# Patient Record
Sex: Female | Born: 1970
Health system: Southern US, Community
[De-identification: ages and names within clinical notes are randomized; demographics above are authoritative.]

## PROBLEM LIST (undated history)

## (undated) DIAGNOSIS — R51 Headache: Secondary | ICD-10-CM

## (undated) DIAGNOSIS — R030 Elevated blood-pressure reading, without diagnosis of hypertension: Secondary | ICD-10-CM

## (undated) DIAGNOSIS — Z800 Family history of malignant neoplasm of digestive organs: Secondary | ICD-10-CM

## (undated) DIAGNOSIS — R7611 Nonspecific reaction to tuberculin skin test without active tuberculosis: Secondary | ICD-10-CM

## (undated) DIAGNOSIS — N200 Calculus of kidney: Secondary | ICD-10-CM

## (undated) DIAGNOSIS — I10 Essential (primary) hypertension: Secondary | ICD-10-CM

## (undated) DIAGNOSIS — Z8639 Personal history of other endocrine, nutritional and metabolic disease: Secondary | ICD-10-CM

## (undated) DIAGNOSIS — M1990 Unspecified osteoarthritis, unspecified site: Secondary | ICD-10-CM

## (undated) DIAGNOSIS — Z87442 Personal history of urinary calculi: Secondary | ICD-10-CM

## (undated) DIAGNOSIS — J45909 Unspecified asthma, uncomplicated: Secondary | ICD-10-CM

## (undated) DIAGNOSIS — C801 Malignant (primary) neoplasm, unspecified: Secondary | ICD-10-CM

## (undated) DIAGNOSIS — Z803 Family history of malignant neoplasm of breast: Secondary | ICD-10-CM

## (undated) DIAGNOSIS — R519 Headache, unspecified: Secondary | ICD-10-CM

## (undated) DIAGNOSIS — F419 Anxiety disorder, unspecified: Secondary | ICD-10-CM

## (undated) HISTORY — DX: Family history of malignant neoplasm of digestive organs: Z80.0

## (undated) HISTORY — DX: Unspecified asthma, uncomplicated: J45.909

## (undated) HISTORY — DX: Elevated blood-pressure reading, without diagnosis of hypertension: R03.0

## (undated) HISTORY — DX: Nonspecific reaction to tuberculin skin test without active tuberculosis: R76.11

## (undated) HISTORY — PX: TONSILLECTOMY: SUR1361

## (undated) HISTORY — DX: Personal history of other endocrine, nutritional and metabolic disease: Z86.39

## (undated) HISTORY — DX: Malignant (primary) neoplasm, unspecified: C80.1

## (undated) HISTORY — DX: Headache, unspecified: R51.9

## (undated) HISTORY — DX: Family history of malignant neoplasm of breast: Z80.3

## (undated) HISTORY — DX: Headache: R51

## (undated) HISTORY — PX: LITHOTRIPSY: SUR834

---

## 2005-04-05 HISTORY — PX: NASAL SEPTUM SURGERY: SHX37

## 2016-02-09 ENCOUNTER — Telehealth: Payer: Self-pay | Admitting: *Deleted

## 2016-02-09 NOTE — Telephone Encounter (Signed)
Pt says that she has a mole forming on her back and would like to know if provider could see her for an acute visit sooner than her np appt? If so is it okay to put 2 slots together for scheduling?    Please advise.   CB: [phone]   Thanks.

## 2016-02-09 NOTE — Telephone Encounter (Signed)
Sure that is fine.

## 2016-02-10 NOTE — Telephone Encounter (Signed)
Called pt to schedule acute visit, no answer. Left vm for pt to call back to schedule.

## 2016-02-11 NOTE — Telephone Encounter (Signed)
Pt has been scheduled.  °

## 2016-02-19 ENCOUNTER — Other Ambulatory Visit (HOSPITAL_COMMUNITY)
Admission: RE | Admit: 2016-02-19 | Discharge: 2016-02-19 | Disposition: A | Discharge status: Home / Self Care | Payer: TRICARE For Life (TFL) | Source: Ambulatory Visit | Attending: Family Medicine | Admitting: Family Medicine

## 2016-02-19 ENCOUNTER — Encounter: Payer: Self-pay | Admitting: Family Medicine

## 2016-02-19 ENCOUNTER — Ambulatory Visit (INDEPENDENT_AMBULATORY_CARE_PROVIDER_SITE_OTHER): Payer: TRICARE For Life (TFL) | Admitting: Family Medicine

## 2016-02-19 VITALS — BP 147/94 | HR 69 | Temp 98.3°F | Ht 66.0 in | Wt 162.4 lb

## 2016-02-19 DIAGNOSIS — L820 Inflamed seborrheic keratosis: Secondary | ICD-10-CM | POA: Diagnosis not present

## 2016-02-19 DIAGNOSIS — L989 Disorder of the skin and subcutaneous tissue, unspecified: Secondary | ICD-10-CM

## 2016-02-19 DIAGNOSIS — R030 Elevated blood-pressure reading, without diagnosis of hypertension: Secondary | ICD-10-CM

## 2016-02-19 NOTE — Progress Notes (Signed)
Pre visit review using our clinic review tool, if applicable. No additional management support is needed unless otherwise documented below in the visit note. 

## 2016-02-19 NOTE — Progress Notes (Signed)
[place] at [facility] [address], [place], [place] [address] [id number] [phone]  Date:  02/19/2016   Name:  Lauren Mcclure   DOB:  18-Mar-1971   MRN:  [id number]  PCP:  No primary care provider on file.    Chief Complaint: Nevus (c/o mole on back x 1 yr. pt states that at first the mole was flat but is now raised. the area is itchy and uncomfortable when touched. )   History of Present Illness:  Lauren Mcclure is a 45 y.o. very pleasant female patient who presents with the following:  She is here today as a new patient with concern about a skin finding on her back.  She moved to this area from [place] last year and is establishing care with [place] She is generally in good health- no current mediation She has had some issues with her BP in the past- she did take medication for a few months a couple of years ago but no longer takes any antihypertensives  HTN does run in her family.   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  She would like to have this removed today if possible She is allergic to rocepin Her menses are irregular and have been so for years,  She does not suspect pregnancy  There are no active problems to display for this patient.   Past Medical History:  Diagnosis Date  . Elevated blood pressure reading   . Headache     Past Surgical History:  Procedure Laterality Date  . NASAL SEPTUM SURGERY  2007  . TONSILLECTOMY      Social History  Substance Use Topics  . Smoking status: Never Smoker  . Smokeless tobacco: Never Used  . Alcohol use Yes     Comment: Occ wine or beer    Family History  Problem Relation Age of Onset  .  Hypertension Mother   . Pancreatic cancer Father     deceased age 27  . Cancer - Other Paternal Grandmother   . Skin cancer Paternal Grandfather   . Heart disease Paternal Grandfather     No Known Allergies  Medication list has been reviewed and updated.  No current outpatient prescriptions on file prior to visit.   No current facility-administered medications on file prior to visit.     Review of Systems:  As per HPI- otherwise negative.  No fever, chills, nausea, vomiting, rash   Physical Examination: Vitals:   02/19/16 1123  BP: (!) 145/83  Pulse: 69  Temp: 98.3 F (36.8 C)   Vitals:   02/19/16 1123  Weight: 162 lb 6.4 oz (73.7 kg)  Height: 5\' 6"  (1.676 m)   Body mass index is 26.21 kg/m. Ideal Body Weight: Weight in (lb) to have BMI = 25: 154.6  GEN: WDWN, NAD, Non-toxic, A & O x 3, looks well HEENT: Atraumatic, Normocephalic. Neck supple. No masses, No LAD. Ears and Nose: No external deformity. CV: RRR, No M/G/R. No JVD. No thrill. No extra heart sounds. PULM: CTA B, no wheezes, crackles, rhonchi. No retractions. No resp. distress. No accessory muscle use. EXTR:  No c/c/e NEURO Normal gait.  PSYCH: Normally interactive. Conversant. Not depressed or anxious appearing.  Calm demeanor.  Skin lesion on left flank- it measures 10 x10 mm and has the appearance of a pedunculated seborrheic keratosis  VC obtained.  Prepped with betadine and alcohol.  Anesthesia with 12ml of 2% lidocaine. Used 11 blade to shave lesion. Had anticipated needing to suture but the lesion was quite superficial so this was not needed.  Used pressure and silver nitrate sticks for hemostasis. Applied bandage. Pt tolerated the procedure well and there were no complications    Assessment and Plan: Skin lesion - Plan: Dermatology pathology  Borderline blood pressure  Here today as a new patient.  Removed skin lesion as above and sent to path.  Await report Discussed wound care with her Her  BP is borderline and has been treated in the past. She will see me next month to follow-up on her BP and do labs.    Signed Lamar Blinks, MD

## 2016-02-19 NOTE — Patient Instructions (Signed)
We will send you skin lesion in for analysis but I do think it is benign- not cancerous  Leave the dressing in place until tomorrow, and then you can shower, pat dry and apply a new dressing.  You can apply a small amount of vaseline to the wound if need be.  Keep it covered with a dressing or band- aid until a solid scab forms. If you have more than very slight bleeding or any other concerns please seek care  Please come and see me in a few months to check on your blood pressure and do labs

## 2016-02-24 ENCOUNTER — Encounter: Payer: Self-pay | Admitting: Family Medicine

## 2016-03-24 ENCOUNTER — Telehealth: Payer: Self-pay

## 2016-03-24 DIAGNOSIS — R7611 Nonspecific reaction to tuberculin skin test without active tuberculosis: Secondary | ICD-10-CM

## 2016-03-24 HISTORY — DX: Nonspecific reaction to tuberculin skin test without active tuberculosis: R76.11

## 2016-03-24 NOTE — Telephone Encounter (Signed)
Pre visit call made to patient

## 2016-03-25 ENCOUNTER — Encounter: Payer: Self-pay | Admitting: Family Medicine

## 2016-03-25 ENCOUNTER — Ambulatory Visit (HOSPITAL_BASED_OUTPATIENT_CLINIC_OR_DEPARTMENT_OTHER)
Admission: RE | Admit: 2016-03-25 | Discharge: 2016-03-25 | Disposition: A | Discharge status: Home / Self Care | Payer: TRICARE For Life (TFL) | Source: Ambulatory Visit | Attending: Family Medicine | Admitting: Family Medicine

## 2016-03-25 ENCOUNTER — Ambulatory Visit (INDEPENDENT_AMBULATORY_CARE_PROVIDER_SITE_OTHER): Payer: TRICARE For Life (TFL) | Admitting: Family Medicine

## 2016-03-25 VITALS — BP 156/85 | HR 65 | Temp 97.9°F | Ht 66.5 in | Wt 164.2 lb

## 2016-03-25 DIAGNOSIS — Z130 Encounter for screening for diseases of the blood and blood-forming organs and certain disorders involving the immune mechanism: Secondary | ICD-10-CM | POA: Diagnosis not present

## 2016-03-25 DIAGNOSIS — M7732 Calcaneal spur, left foot: Secondary | ICD-10-CM | POA: Insufficient documentation

## 2016-03-25 DIAGNOSIS — M79672 Pain in left foot: Secondary | ICD-10-CM

## 2016-03-25 DIAGNOSIS — Z23 Encounter for immunization: Secondary | ICD-10-CM

## 2016-03-25 DIAGNOSIS — N926 Irregular menstruation, unspecified: Secondary | ICD-10-CM

## 2016-03-25 DIAGNOSIS — M25561 Pain in right knee: Secondary | ICD-10-CM

## 2016-03-25 DIAGNOSIS — Z13220 Encounter for screening for lipoid disorders: Secondary | ICD-10-CM

## 2016-03-25 DIAGNOSIS — G8929 Other chronic pain: Secondary | ICD-10-CM

## 2016-03-25 DIAGNOSIS — M25562 Pain in left knee: Secondary | ICD-10-CM

## 2016-03-25 DIAGNOSIS — Z131 Encounter for screening for diabetes mellitus: Secondary | ICD-10-CM

## 2016-03-25 DIAGNOSIS — M1711 Unilateral primary osteoarthritis, right knee: Secondary | ICD-10-CM | POA: Insufficient documentation

## 2016-03-25 DIAGNOSIS — R030 Elevated blood-pressure reading, without diagnosis of hypertension: Secondary | ICD-10-CM

## 2016-03-25 DIAGNOSIS — R937 Abnormal findings on diagnostic imaging of other parts of musculoskeletal system: Secondary | ICD-10-CM | POA: Diagnosis not present

## 2016-03-25 DIAGNOSIS — Z1329 Encounter for screening for other suspected endocrine disorder: Secondary | ICD-10-CM

## 2016-03-25 DIAGNOSIS — G43109 Migraine with aura, not intractable, without status migrainosus: Secondary | ICD-10-CM

## 2016-03-25 LAB — CBC
HCT: 40.6 % (ref 36.0–46.0)
Hemoglobin: 13.5 g/dL (ref 12.0–15.0)
MCHC: 33.2 g/dL (ref 30.0–36.0)
MCV: 73.5 fl — AB (ref 78.0–100.0)
Platelets: 294 10*3/uL (ref 150.0–400.0)
RBC: 5.53 Mil/uL — ABNORMAL HIGH (ref 3.87–5.11)
RDW: 17.9 % — AB (ref 11.5–15.5)
WBC: 9.4 10*3/uL (ref 4.0–10.5)

## 2016-03-25 LAB — POCT URINE PREGNANCY: PREG TEST UR: NEGATIVE

## 2016-03-25 LAB — COMPREHENSIVE METABOLIC PANEL
ALT: 20 U/L (ref 0–35)
AST: 19 U/L (ref 0–37)
Albumin: 4.3 g/dL (ref 3.5–5.2)
Alkaline Phosphatase: 71 U/L (ref 39–117)
BILIRUBIN TOTAL: 1.6 mg/dL — AB (ref 0.2–1.2)
BUN: 11 mg/dL (ref 6–23)
CHLORIDE: 104 meq/L (ref 96–112)
CO2: 31 meq/L (ref 19–32)
CREATININE: 0.84 mg/dL (ref 0.40–1.20)
Calcium: 9.3 mg/dL (ref 8.4–10.5)
GFR: 77.28 mL/min (ref >60.00)
GLUCOSE: 106 mg/dL — AB (ref 70–99)
Potassium: 3.5 mEq/L (ref 3.5–5.1)
Sodium: 140 mEq/L (ref 135–145)
Total Protein: 7.4 g/dL (ref 6.0–8.3)

## 2016-03-25 LAB — LIPID PANEL
CHOL/HDL RATIO: 4
Cholesterol: 193 mg/dL (ref 0–200)
HDL: 44.5 mg/dL (ref >39.00)
LDL CALC: 111 mg/dL — AB (ref 0–99)
NONHDL: 148.69
TRIGLYCERIDES: 187 mg/dL — AB (ref 0.0–149.0)
VLDL: 37.4 mg/dL (ref 0.0–40.0)

## 2016-03-25 LAB — HEMOGLOBIN A1C: Hgb A1c MFr Bld: 5.7 % (ref 4.6–6.5)

## 2016-03-25 LAB — TSH: TSH: 0.94 u[IU]/mL (ref 0.35–4.50)

## 2016-03-25 MED ORDER — AMLODIPINE BESYLATE 2.5 MG PO TABS: 2.5000 mg | ORAL_TABLET | Freq: Every day | ORAL | 3 refills | Status: DC

## 2016-03-25 MED ORDER — SUMATRIPTAN SUCCINATE 100 MG PO TABS: ORAL_TABLET | ORAL | 0 refills | Status: DC

## 2016-03-25 MED ORDER — CELECOXIB 200 MG PO CAPS: 200.0000 mg | ORAL_CAPSULE | Freq: Every day | ORAL | 3 refills | Status: DC

## 2016-03-25 NOTE — Progress Notes (Signed)
[place] at [facility] [address], [place], [place] [address] [id number] [phone]  Date:  03/25/2016   Name:  Lauren Mcclure   DOB:  1970/09/23   MRN:  [id number]  PCP:  Lamar Blinks, MD    Chief Complaint: Establish Care (Pt here to est care. Would like flu vaccine today. )   History of Present Illness:  Lauren Mcclure is a 45 y.o. very pleasant female patient who presents with the following:  Seen here about a month ago- I removed a benign skin lesion from her back at that time- seborrheic keratosis   She was on BP med in the past- has not taken in a couple of years. She is not quite sure what she took however. She was also on cholesterol med.  Her mother has a history of HTN She does play a lot of tennis for exercise- she plays year round. She plays 2-3x a week right now.  Will play more in the summer months She would like to lose some weight. She exercises but thinks she should improve her diet She is not fasting- she had coffee, bread and butter.    She does not have children. Never been pregnant   She tends to have left heel pain- it has been present for about 6 months.  Not worse in the morning.  Worst after activity such as playing tennis She also noted bilateral knee pain that is on and off.  Worse after she plays tennis or is on her feet a lot She has used some of her husband's celebrex that really helps her- she would like to have an rx for same  BP Readings from Last 3 Encounters:  03/25/16 (!) 152/78  02/19/16 (!) 147/94   She has noted headaches around the time of her menses. Her LMP was about 3 weeks ago.  She has tried midol, motrin, celebrex, excedrin.  The HA will generally be in one side of her head.  She does not have nausea or vomiting but does have phono and photosensitivity.  She will sometimes have an aura.  She will have to rest in a dark room when she has the HA.  Seems consistent with  migraine  Patient Active Problem List   Diagnosis Date Noted  . Elevated blood pressure reading   . Borderline blood pressure 02/19/2016    Past Medical History:  Diagnosis Date  . Asthma    as a teenager  . Elevated blood pressure reading   . Headache   . History of hyperlipidemia   . 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 NASAL SEPTUM SURGERY  2007  . TONSILLECTOMY      Social History  Substance Use Topics  . Smoking status: Never Smoker  . Smokeless tobacco: Never Used  . Alcohol use Yes     Comment: Occ wine or beer    Family History  Problem Relation Age of Onset  . Hypertension Mother   . Hyperlipidemia Mother   . Pancreatic cancer Father     deceased age 19  . Cancer - Other Paternal Grandmother   . Skin cancer Paternal Grandfather   . Heart disease Paternal Grandfather   . Hyperlipidemia Maternal Aunt     Allergies  Allergen Reactions  . Rocephin [Ceftriaxone Sodium In Dextrose] Rash    Medication list has  been reviewed and updated.  Current Outpatient Prescriptions on File Prior to Visit  Medication Sig Dispense Refill  . Ascorbic Acid (VITAMIN C PO) Take 1 tablet by mouth daily.    . Cholecalciferol (VITAMIN D PO) Take 1 tablet by mouth daily.    Marland Kitchen CRANBERRY PO Take 1 tablet by mouth daily.    . TURMERIC PO Take 1 tablet by mouth daily.     No current facility-administered medications on file prior to visit.     Review of Systems:  As per HPI- otherwise negative.   Physical Examination: Vitals:   03/25/16 0939  BP: (!) 152/78  Pulse: 65  Temp: 97.9 F (36.6 C)   Vitals:   03/25/16 0939  Weight: 164 lb 3.2 oz (74.5 kg)  Height: 5' 6.5" (1.689 m)   Body mass index is 26.11 kg/m. Ideal Body Weight: Weight in (lb) to have BMI = 25: 156.9  GEN: WDWN, NAD, Non-toxic, A & O x 3, slight overweight, looks well HEENT:  Atraumatic, Normocephalic. Neck supple. No masses, No LAD.  Bilateral TM wnl, oropharynx normal.  PEERL,EOMI.   Ears and Nose: No external deformity. CV: RRR, No M/G/R. No JVD. No thrill. No extra heart sounds. PULM: CTA B, no wheezes, crackles, rhonchi. No retractions. No resp. distress. No accessory muscle use. ABD: S, NT, ND, +BS. No rebound. No HSM. EXTR: No c/c/e NEURO Normal gait.  PSYCH: Normally interactive. Conversant. Not depressed or anxious appearing.  Calm demeanor.  Both knees with normal ROM, cannot reproduce pain on exam.  Ligaments are stable, no effusion, heat or redness Left cancaneous is slightly tender to squeeze, ankle tendons are non tender.  Achilles is intact  Dg Os Calcis Left  Result Date: 03/25/2016 CLINICAL DATA:  Chronic pain EXAM: LEFT OS CALCIS - 2+ VIEW COMPARISON:  None. FINDINGS: Lateral and Harris views were obtained. No fracture or dislocation. The joint spaces appear normal. No erosion. There are spurs arising from the posterior calcaneus and to a lesser extent inferiorly from the calcaneus. IMPRESSION: Calcaneal spurs. No fracture or dislocation. No apparent arthropathy. Electronically Signed   By: Lowella Grip III M.D.   On: 03/25/2016 12:06   Dg Knee Complete 4 Views Left  Result Date: 03/25/2016 CLINICAL DATA:  Pain EXAM: LEFT KNEE - COMPLETE 4+ VIEW COMPARISON:  None. FINDINGS: Frontal, lateral, and bilateral oblique views were obtained. There is no fracture or dislocation. No joint effusion. Joint spaces appear unremarkable. No erosive change. Soft tissue prominence is noted posterior to the knee joint. IMPRESSION: Soft tissue prominence noted posterior to the joint. Question Baker cyst. From an imaging standpoint, ultrasound or MR would be the imaging studies of choice for further assessment for potential Baker cyst. No fracture or joint effusion. No appreciable joint space narrowing or erosion. Electronically Signed   By: Lowella Grip III  M.D.   On: 03/25/2016 12:04   Dg Knee Complete 4 Views Right  Result Date: 03/25/2016 CLINICAL DATA:  Pain EXAM: RIGHT KNEE - COMPLETE 4+ VIEW COMPARISON:  None. FINDINGS: Frontal, lateral, and bilateral oblique views were obtained. There is no evident fracture or dislocation. No joint effusion. Joint spaces appear normal. There is slight spurring along the posterior patella and medial compartments. No erosive change. IMPRESSION: Slight osteoarthritic change.  No fracture or joint effusion. Electronically Signed   By: Lowella Grip III M.D.   On: 03/25/2016 12:04   Results for orders placed or performed in visit on 03/25/16  CBC  Result  Value Ref Range   WBC 9.4 4.0 - 10.5 K/uL   RBC 5.53 (H) 3.87 - 5.11 Mil/uL   Platelets 294.0 150.0 - 400.0 K/uL   Hemoglobin 13.5 12.0 - 15.0 g/dL   HCT 40.6 36.0 - 46.0 %   MCV 73.5 (L) 78.0 - 100.0 fl   MCHC 33.2 30.0 - 36.0 g/dL   RDW 17.9 (H) 11.5 - 15.5 %  Comprehensive metabolic panel  Result Value Ref Range   Sodium 140 135 - 145 mEq/L   Potassium 3.5 3.5 - 5.1 mEq/L   Chloride 104 96 - 112 mEq/L   CO2 31 19 - 32 mEq/L   Glucose, Bld 106 (H) 70 - 99 mg/dL   BUN 11 6 - 23 mg/dL   Creatinine, Ser 0.84 0.40 - 1.20 mg/dL   Total Bilirubin 1.6 (H) 0.2 - 1.2 mg/dL   Alkaline Phosphatase 71 39 - 117 U/L   AST 19 0 - 37 U/L   ALT 20 0 - 35 U/L   Total Protein 7.4 6.0 - 8.3 g/dL   Albumin 4.3 3.5 - 5.2 g/dL   Calcium 9.3 8.4 - 10.5 mg/dL   GFR 77.28 >60.00 mL/min  Lipid panel  Result Value Ref Range   Cholesterol 193 0 - 200 mg/dL   Triglycerides 187.0 (H) 0.0 - 149.0 mg/dL   HDL 44.50 >39.00 mg/dL   VLDL 37.4 0.0 - 40.0 mg/dL   LDL Cholesterol 111 (H) 0 - 99 mg/dL   Total CHOL/HDL Ratio 4    NonHDL 148.69   TSH  Result Value Ref Range   TSH 0.94 0.35 - 4.50 uIU/mL  Hemoglobin A1c  Result Value Ref Range   Hgb A1c MFr Bld 5.7 4.6 - 6.5 %  POCT urine pregnancy  Result Value Ref Range   Preg Test, Ur Negative Negative     Assessment and Plan: Elevated blood pressure reading - Plan: amLODipine (NORVASC) 2.5 MG tablet  Need for immunization against influenza - Plan: Flu Vaccine QUAD 36+ mos PF IM (Fluarix & Fluzone Quad PF)  Screening for hyperlipidemia - Plan: Lipid panel  Screening for diabetes mellitus - Plan: Comprehensive metabolic panel, Hemoglobin A1c  Screening for thyroid disorder - Plan: TSH  Screening for deficiency anemia - Plan: CBC  Chronic pain of both knees - Plan: celecoxib (CELEBREX) 200 MG capsule, DG Knee Complete 4 Views Left, DG Knee Complete 4 Views Right  Migraine with aura and without status migrainosus, not intractable - Plan: SUMAtriptan (IMITREX) 100 MG tablet  Chronic heel pain, left - Plan: DG Os Calcis Left  Irregular menses - Plan: POCT urine pregnancy  Here today to discuss a few concerns.   Labs as above, sent a lab letter celebrex to use prn for knee pain It sounds as though she has migraine headache- will have her try a triptan as above Flu shot Start on a low dose of norvasc for her BP Obtain knee and heel films and discuss options with her  Signed Lamar Blinks, MD

## 2016-03-25 NOTE — Patient Instructions (Addendum)
It was very nice to see you today- we will get x-rays of your knees and your left heel today. I will be in touch with these results asap We will also check your labs for you today You got a flu shot today We are going to start amlodipine 2.5 mg for your blood pressure- take it just once a day Take the celebrex daily if needed for knee pain I gave you an rx for imitrex- try this for your more severe (migraine) headaches. Take it at the first sign of the headache  Please come and see me in about 2 months to check your blood pressure

## 2016-03-25 NOTE — Progress Notes (Signed)
Pre visit review using our clinic review tool, if applicable. No additional management support is needed unless otherwise documented below in the visit note. 

## 2016-03-26 ENCOUNTER — Other Ambulatory Visit: Payer: TRICARE For Life (TFL)

## 2016-03-26 DIAGNOSIS — M79672 Pain in left foot: Secondary | ICD-10-CM

## 2016-03-26 DIAGNOSIS — G8929 Other chronic pain: Secondary | ICD-10-CM

## 2016-03-26 DIAGNOSIS — M25561 Pain in right knee: Secondary | ICD-10-CM

## 2016-03-26 DIAGNOSIS — M25562 Pain in left knee: Secondary | ICD-10-CM

## 2016-03-27 LAB — BILIRUBIN, FRACTIONATED(TOT/DIR/INDIR)
Bilirubin, Direct: 0.1 mg/dL (ref <=0.2)
Indirect Bilirubin: 1.4 mg/dL — ABNORMAL HIGH (ref 0.2–1.2)
Total Bilirubin: 1.5 mg/dL — ABNORMAL HIGH (ref 0.2–1.2)

## 2016-03-27 NOTE — Progress Notes (Signed)
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   Called pt to discuss- will refer her to sports med to discuss further.  She may ned further eval of her heel to rule-out stress fracture Labs are ok- have sent her a letter with labs. 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   Results for orders placed or performed in visit on 03/26/16  Bilirubin, fractionated(tot/dir/indir)  Result Value Ref Range   Total Bilirubin 1.5 (H) 0.2 - 1.2 mg/dL   Bilirubin, Direct 0.1 <=0.2 mg/dL   Indirect Bilirubin 1.4 (H) 0.2 - 1.2 mg/dL     Chemistry      Component Value Date/Time   NA 140 03/25/2016 1031   K 3.5 03/25/2016 1031   CL 104 03/25/2016 1031   CO2 31 03/25/2016 1031   BUN 11 03/25/2016 1031   CREATININE 0.84 03/25/2016 1031      Component Value Date/Time   CALCIUM 9.3 03/25/2016 1031   ALKPHOS 71 03/25/2016 1031   AST 19 03/25/2016 1031   ALT 20 03/25/2016 1031   BILITOT 1.5 (H) 03/26/2016 1511     Dg Os Calcis Left  Result Date: 03/25/2016 CLINICAL DATA:  Chronic pain EXAM: LEFT OS CALCIS - 2+ VIEW COMPARISON:  None. FINDINGS: Lateral and Harris views were obtained. No fracture or dislocation. The joint spaces appear normal. No erosion. There are spurs arising from the posterior calcaneus and to a lesser extent inferiorly from the calcaneus. IMPRESSION: Calcaneal spurs. No fracture or dislocation. No apparent arthropathy. Electronically Signed   By: Lowella Grip III M.D.   On: 03/25/2016 12:06   Dg Knee Complete 4 Views Left  Result Date: 03/25/2016 CLINICAL DATA:  Pain EXAM: LEFT KNEE - COMPLETE 4+ VIEW COMPARISON:  None. FINDINGS: Frontal, lateral, and bilateral oblique views were obtained. There is no fracture or dislocation. No joint effusion. Joint spaces appear unremarkable. No erosive change. Soft tissue prominence is noted posterior to the knee joint. IMPRESSION: Soft tissue prominence noted posterior to the joint. Question Baker cyst.  From an imaging standpoint, ultrasound or MR would be the imaging studies of choice for further assessment for potential Baker cyst. No fracture or joint effusion. No appreciable joint space narrowing or erosion. Electronically Signed   By: Lowella Grip III M.D.   On: 03/25/2016 12:04   Dg Knee Complete 4 Views Right  Result Date: 03/25/2016 CLINICAL DATA:  Pain EXAM: RIGHT KNEE - COMPLETE 4+ VIEW COMPARISON:  None. FINDINGS: Frontal, lateral, and bilateral oblique views were obtained. There is no evident fracture or dislocation. No joint effusion. Joint spaces appear normal. There is slight spurring along the posterior patella and medial compartments. No erosive change. IMPRESSION: Slight osteoarthritic change.  No fracture or joint effusion. Electronically Signed   By: Lowella Grip III M.D.   On: 03/25/2016 12:04

## 2016-06-02 ENCOUNTER — Ambulatory Visit: Admitting: Family Medicine

## 2016-06-02 ENCOUNTER — Telehealth: Payer: Self-pay | Admitting: Family Medicine

## 2016-06-02 NOTE — Telephone Encounter (Signed)
Patient cancelled 2:30pm appointment due to work conflict, patient [facility] to 07/01/16 at 1pm, charge or no charge

## 2016-06-02 NOTE — Telephone Encounter (Signed)
No charge. 

## 2016-07-01 ENCOUNTER — Ambulatory Visit: Admitting: Family Medicine

## 2016-11-04 ENCOUNTER — Encounter (HOSPITAL_BASED_OUTPATIENT_CLINIC_OR_DEPARTMENT_OTHER): Payer: Self-pay | Admitting: Emergency Medicine

## 2016-11-04 ENCOUNTER — Emergency Department (HOSPITAL_BASED_OUTPATIENT_CLINIC_OR_DEPARTMENT_OTHER)
Admission: EM | Admit: 2016-11-04 | Discharge: 2016-11-04 | Disposition: A | Discharge status: Home / Self Care | Payer: TRICARE For Life (TFL) | Attending: Emergency Medicine | Admitting: Emergency Medicine

## 2016-11-04 ENCOUNTER — Telehealth: Payer: Self-pay | Admitting: Family Medicine

## 2016-11-04 ENCOUNTER — Emergency Department (HOSPITAL_BASED_OUTPATIENT_CLINIC_OR_DEPARTMENT_OTHER): Payer: TRICARE For Life (TFL)

## 2016-11-04 DIAGNOSIS — Z79899 Other long term (current) drug therapy: Secondary | ICD-10-CM | POA: Diagnosis not present

## 2016-11-04 DIAGNOSIS — N200 Calculus of kidney: Secondary | ICD-10-CM | POA: Diagnosis not present

## 2016-11-04 DIAGNOSIS — R1084 Generalized abdominal pain: Secondary | ICD-10-CM | POA: Diagnosis present

## 2016-11-04 DIAGNOSIS — R110 Nausea: Secondary | ICD-10-CM | POA: Diagnosis not present

## 2016-11-04 DIAGNOSIS — J45909 Unspecified asthma, uncomplicated: Secondary | ICD-10-CM | POA: Insufficient documentation

## 2016-11-04 LAB — COMPREHENSIVE METABOLIC PANEL
ALBUMIN: 3.8 g/dL (ref 3.5–5.0)
ALT: 45 U/L (ref 14–54)
AST: 35 U/L (ref 15–41)
Alkaline Phosphatase: 65 U/L (ref 38–126)
Anion gap: 8 (ref 5–15)
BILIRUBIN TOTAL: 2 mg/dL — AB (ref 0.3–1.2)
BUN: 14 mg/dL (ref 6–20)
CALCIUM: 8.6 mg/dL — AB (ref 8.9–10.3)
CO2: 25 mmol/L (ref 22–32)
CREATININE: 1.31 mg/dL — AB (ref 0.44–1.00)
Chloride: 104 mmol/L (ref 101–111)
GFR calc Af Amer: 55 mL/min — ABNORMAL LOW (ref >60)
GFR, EST NON AFRICAN AMERICAN: 48 mL/min — AB (ref >60)
GLUCOSE: 91 mg/dL (ref 65–99)
Potassium: 3.2 mmol/L — ABNORMAL LOW (ref 3.5–5.1)
Sodium: 137 mmol/L (ref 135–145)
TOTAL PROTEIN: 7.2 g/dL (ref 6.5–8.1)

## 2016-11-04 LAB — CBC WITH DIFFERENTIAL/PLATELET
BASOS ABS: 0 10*3/uL (ref 0.0–0.1)
BASOS PCT: 0 %
EOS ABS: 0.1 10*3/uL (ref 0.0–0.7)
EOS PCT: 1 %
HCT: 41.5 % (ref 36.0–46.0)
Hemoglobin: 14.4 g/dL (ref 12.0–15.0)
LYMPHS PCT: 19 %
Lymphs Abs: 2.3 10*3/uL (ref 0.7–4.0)
MCH: 26.9 pg (ref 26.0–34.0)
MCHC: 34.7 g/dL (ref 30.0–36.0)
MCV: 77.4 fL — AB (ref 78.0–100.0)
MONO ABS: 1.4 10*3/uL — AB (ref 0.1–1.0)
Monocytes Relative: 11 %
Neutro Abs: 8.6 10*3/uL — ABNORMAL HIGH (ref 1.7–7.7)
Neutrophils Relative %: 69 %
PLATELETS: 278 10*3/uL (ref 150–400)
RBC: 5.36 MIL/uL — AB (ref 3.87–5.11)
RDW: 15 % (ref 11.5–15.5)
WBC: 12.5 10*3/uL — AB (ref 4.0–10.5)

## 2016-11-04 MED ORDER — SODIUM CHLORIDE 0.9 % IV BOLUS (SEPSIS)
1000.0000 mL | Freq: Once | INTRAVENOUS | Status: AC
Start: 2016-11-04 — End: 2016-11-04
  Administered 2016-11-04: 1000 mL via INTRAVENOUS

## 2016-11-04 MED ORDER — KETOROLAC TROMETHAMINE 10 MG PO TABS: 10.0000 mg | ORAL_TABLET | Freq: Four times a day (QID) | ORAL | 0 refills | Status: DC | PRN

## 2016-11-04 MED ORDER — KETOROLAC TROMETHAMINE 15 MG/ML IJ SOLN
15.0000 mg | Freq: Once | INTRAMUSCULAR | Status: AC
  Administered 2016-11-04: 15 mg via INTRAVENOUS
  Filled 2016-11-04: qty 1

## 2016-11-04 MED ORDER — SODIUM CHLORIDE 0.9 % IV SOLN: 1000.0000 mL | INTRAVENOUS | Status: DC

## 2016-11-04 MED ORDER — KETOROLAC TROMETHAMINE 30 MG/ML IJ SOLN
30.0000 mg | Freq: Once | INTRAMUSCULAR | Status: AC
  Administered 2016-11-04: 30 mg via INTRAVENOUS
  Filled 2016-11-04: qty 1

## 2016-11-04 MED FILL — KETOROLAC 10 MG TABLET: 10 | 5 days supply | Qty: 20 | Fill #0

## 2016-11-04 NOTE — Telephone Encounter (Signed)
Caller name: [employer]  Relation to pt: self  Call back number: [phone] Pharmacy:  Reason for call: Pt came in office stating was seen at the ER and doctor that saw her today stated pt is needing an urgent referral to Urology, pt asked for an Urology that is located near by, pt preferred [facility] [address], [place]. Please if possible to put in referral ASAP to the location mentioned above. Please advise.

## 2016-11-04 NOTE — Discharge Instructions (Signed)
Take Toradol every 4-6 hours as needed for pain. Do not take more than 4 in a 24-hour period. Do not take other anti-inflammatories at the same time (naproxen, Aleve, ibuprofen, Motrin, Advil) Continue taking tamsulosin as prescribed. You may use Zofran as needed for nausea. Stay well-hydrated with water. Follow-up with urology. Return to the emergency department if you develop persistent fevers, worsening pain, persistent vomiting, inability to urinate, or any new or worsening symptoms.

## 2016-11-04 NOTE — ED Provider Notes (Signed)
[place] [facility] Provider Note   CSN: [id number] Arrival date & time: 11/04/16  1043     History   Chief Complaint Chief Complaint  Patient presents with  . Flank Pain  . Abdominal Pain    HPI Lauren Mcclure is a 46 y.o. female presenting with right side flank pain 1 day.  Patient states that she had acute onset right side flank pain yesterday morning. Pain is intermittent and sharp. Worsens with movement. No association with oral intake or urination. Patient reports associated nausea. She was evaluated at urgent care yesterday, where they did a UA and found blood in the urine without signs of UTI. Additionally, they did an x-ray which they found that she had an increased stool burden. She was told to follow-up for further evaluation for possible kidney stone. She denies fever, chills, chest pain, shortness of breath, cough, current nausea, vomiting, dysuria, increased frequency of urination, gross blood in the urine, abnormal bowel movements, or anterior abdominal pain. She was given ultram to help with the pain from urgent care, and states that she took 2 with only minimal relief. She states she has high blood pressure and takes medication for this, but has not taken it today.   HPI  Past Medical History:  Diagnosis Date  . Asthma    as a teenager  . Elevated blood pressure reading   . Headache   . History of hyperlipidemia   . 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 Elevated blood pressure reading   . Borderline blood pressure 02/19/2016    Past Surgical History:  Procedure Laterality Date  . NASAL SEPTUM SURGERY  2007  . TONSILLECTOMY      OB History    No data available       Home Medications    Prior to Admission medications   Medication Sig Start Date End Date Taking? Authorizing Provider  amLODipine (NORVASC) 2.5 MG tablet Take 1 tablet  (2.5 mg total) by mouth daily. 03/25/16   Copland, Gay Filler, MD  Ascorbic Acid (VITAMIN C PO) Take 1 tablet by mouth daily.    [provider]  celecoxib (CELEBREX) 200 MG capsule Take 1 capsule (200 mg total) by mouth daily. Use as needed for knee pain 03/25/16   Copland, Gay Filler, MD  Cholecalciferol (VITAMIN D PO) Take 1 tablet by mouth daily.    [provider]  CRANBERRY PO Take 1 tablet by mouth daily.    [provider]  ketorolac (TORADOL) 10 MG tablet Take 1 tablet (10 mg total) by mouth every 6 (six) hours as needed. 11/04/16   Roe Wilner, PA-C  TURMERIC PO Take 1 tablet by mouth daily.    [provider]    Family History Family History  Problem Relation Age of Onset  . Hypertension Mother   . Hyperlipidemia Mother   . Pancreatic cancer Father        deceased age 14  . Cancer - Other Paternal Grandmother   . Skin cancer Paternal Grandfather   . Heart disease Paternal Grandfather   . Hyperlipidemia Maternal Aunt     Social History Social History  Substance Use Topics  . Smoking status: Never Smoker  . Smokeless tobacco: Never Used  . Alcohol use Yes     Comment: Occ wine or beer     Allergies   Rocephin [  ceftriaxone sodium in dextrose]   Review of Systems Review of Systems  Constitutional: Negative for chills and fever.  HENT: Negative for congestion and sore throat.   Eyes: Negative for photophobia and visual disturbance.  Respiratory: Negative for cough, chest tightness and shortness of breath.   Cardiovascular: Negative for chest pain and palpitations.  Gastrointestinal: Positive for nausea. Negative for abdominal distention, blood in stool, constipation, diarrhea and vomiting.  Genitourinary: Positive for flank pain. Negative for dysuria, frequency and hematuria.  Musculoskeletal: Negative for neck pain and neck stiffness.  Skin: Negative for wound.  Neurological: Negative for dizziness, light-headedness and  headaches.  Hematological: Does not bruise/bleed easily.  Psychiatric/Behavioral: Negative for agitation and confusion.     Physical Exam Updated Vital Signs BP (!) 161/99 (BP Location: Right Arm)   Pulse (!) 56   Temp 98.2 F (36.8 C) (Oral)   Resp 18   Ht 5\' 6"  (1.676 m)   Wt 73.5 kg (162 lb)   SpO2 100%   BMI 26.15 kg/m   Physical Exam  Constitutional: She is oriented to person, place, and time. She appears well-developed and well-nourished. No distress.  HENT:  Head: Normocephalic and atraumatic.  Eyes: EOM are normal.  Neck: Normal range of motion. Neck supple.  Cardiovascular: Normal rate, regular rhythm, normal heart sounds and intact distal pulses.   Pulmonary/Chest: Effort normal and breath sounds normal. No respiratory distress. She has no wheezes.  Abdominal: Soft. Bowel sounds are normal. She exhibits no distension. There is CVA tenderness.  Tenderness to palpation of the right flank, with associated soreness of the right side. No tenderness to palpation elsewhere in the abdomen.  Musculoskeletal: Normal range of motion.  Neurological: She is alert and oriented to person, place, and time.  Skin: Skin is warm and dry.  Psychiatric: She has a normal mood and affect.  Nursing note and vitals reviewed.    ED Treatments / Results  Labs (all labs ordered are listed, but only abnormal results are displayed) Labs Reviewed  COMPREHENSIVE METABOLIC PANEL - Abnormal; Notable for the following:       Result Value   Potassium 3.2 (*)    Creatinine, Ser 1.31 (*)    Calcium 8.6 (*)    Total Bilirubin 2.0 (*)    GFR calc non Af Amer 48 (*)    GFR calc Af Amer 55 (*)    All other components within normal limits  CBC WITH DIFFERENTIAL/PLATELET - Abnormal; Notable for the following:    WBC 12.5 (*)    RBC 5.36 (*)    MCV 77.4 (*)    Neutro Abs 8.6 (*)    Monocytes Absolute 1.4 (*)    All other components within normal limits    EKG  EKG Interpretation None         Radiology Ct Renal Stone Study  Result Date: 11/04/2016 CLINICAL DATA:  Right flank pain radiating to right abdomen, nausea and urinary frequency. Possible kidney stone. Additional history of hyperlipidemia and asthma. EXAM: CT ABDOMEN AND PELVIS WITHOUT CONTRAST TECHNIQUE: Multidetector CT imaging of the abdomen and pelvis was performed following the standard protocol without IV contrast. COMPARISON:  None. FINDINGS: Lower chest: No acute abnormality. Hepatobiliary: Liver is low in density suggesting fatty infiltration. Irregular low-density focus within the inferior left liver lobe, centered in segment 4B, measures 2.5 x 2.1 cm, possibly additional focal fatty infiltration. Gallbladder is unremarkable.  No bile duct dilatation. Pancreas: Unremarkable. No pancreatic ductal dilatation or surrounding inflammatory changes.  Spleen: Normal in size without focal abnormality. Adrenals/Urinary Tract: 4 mm stone within the proximal right ureter causing moderate right-sided hydronephrosis with associated perinephric edema. Left kidney appears normal without mass, stone or hydronephrosis. Bladder is unremarkable, partially decompressed. Stomach/Bowel: Bowel is normal in caliber. No bowel wall thickening or evidence of bowel wall inflammation. Appendix is not convincingly seen but there are no inflammatory changes about the cecum to suggest acute appendicitis. Vascular/Lymphatic: No significant vascular findings are present. No enlarged abdominal or pelvic lymph nodes. Reproductive: Probable small fibroids exophytic to the uterine body. Cystic mass in the right adnexa measures 3.2 cm, presumed ovarian cyst versus normal dominant follicle. Left adnexal region is unremarkable. Other: No free intraperitoneal air. Musculoskeletal: No acute or suspicious osseous finding. Superficial soft tissues are unremarkable. IMPRESSION: 1. 4 mm obstructing stone within the proximal right ureter causing moderate right-sided  hydronephrosis with associated perinephric edema. 2. Fatty infiltration of the liver. 3. **An incidental finding of potential clinical significance has been found. Ill-defined low-density focus within the inferior left liver lobe, segment 4B, measuring approximately 2.5 cm greatest dimension, suspected additional focal fatty infiltration given its location adjacent to the gallbladder fossa. Neoplastic lesion or hemangioma is considered less likely but cannot be completely excluded based on this single noncontrast examination. Given the background of hepatic steatosis, recommend further characterization with a nonemergent liver MRI. ** 4. Probable small uterine fibroids. Electronically Signed   By: Franki Cabot M.D.   On: 11/04/2016 12:40    Procedures Procedures (including critical care time)  Medications Ordered in ED Medications  sodium chloride 0.9 % bolus 1,000 mL (0 mLs Intravenous Stopped 11/04/16 1350)  ketorolac (TORADOL) 30 MG/ML injection 30 mg (30 mg Intravenous Given 11/04/16 1253)  ketorolac (TORADOL) 15 MG/ML injection 15 mg (15 mg Intravenous Given 11/04/16 1421)     Initial Impression / Assessment and Plan / ED Course  I have reviewed the triage vital signs and the nursing notes.  Pertinent labs & imaging results that were available during my care of the patient were reviewed by me and considered in my medical decision making (see chart for details).     Pt presenting with acute onset R flank pain concerning for kidney stone. Labs and CT renal ordered. Will not reorder UA, as yesterdays shows hgb an no signs of UTI (per chart review). IVF, ketorolac given for pain.   Labs showed slight increase in SCr (1.31, increased from baseline 0.8). And increased WBC at 12.5. CT renal showed 4 mm stone at proximal R ureter with moderate hydronephrosis and perinephric edema. On reassessment, pt states her pain is at 0. Discussed with attending, and Dr. Canary Brim agrees to plan. Consulted urology,  and spoke with Dr. Tresa Moore. Pt to take oral toradol and tamulosin, and f/u in office tomorrow or Monday.   Discussed findings and plan with pt. Pt states she has tamulosin and zofran rx from UC. Discussed taking toradol instead of ultram. Pt states her pain is returning slightly, will give 15 of ketorolac, for a total of 45 today. Pt appears safe for discharge. Return precautions given. Pt states she understands and agrees to plan.   Final Clinical Impressions(s) / ED Diagnoses   Final diagnoses:  Kidney stone    New Prescriptions Discharge Medication List as of 11/04/2016  2:10 PM    START taking these medications   Details  ketorolac (TORADOL) 10 MG tablet Take 1 tablet (10 mg total) by mouth every 6 (six) hours as needed., Starting  Thu 11/04/2016, Print         [place], Graeson Nouri, PA-C 11/04/16 1813    Pixie Casino, MD 11/05/16 [phone]

## 2016-11-04 NOTE — Telephone Encounter (Signed)
Please give her a call, let her know that referral is placed.  If she needs help in the meantime please let me know .  Ok to leave this info on her machine

## 2016-11-04 NOTE — ED Triage Notes (Signed)
Pt having right flank pain radiating to right abdomen.  Pt has some nausea and urinary frequency.  No fever.  Pt seen yesterday at [facility] urgent care and was referred for a CT scan for possible kidney stone.

## 2016-11-05 NOTE — Telephone Encounter (Signed)
Called pt to inform referral has been placed. No answer, left voicemail.

## 2016-11-08 ENCOUNTER — Ambulatory Visit: Admitting: Family Medicine

## 2016-11-09 ENCOUNTER — Emergency Department (HOSPITAL_BASED_OUTPATIENT_CLINIC_OR_DEPARTMENT_OTHER)

## 2016-11-09 ENCOUNTER — Emergency Department (HOSPITAL_BASED_OUTPATIENT_CLINIC_OR_DEPARTMENT_OTHER)
Admission: EM | Admit: 2016-11-09 | Discharge: 2016-11-09 | Disposition: A | Discharge status: Home / Self Care | Attending: Emergency Medicine | Admitting: Emergency Medicine

## 2016-11-09 ENCOUNTER — Encounter (HOSPITAL_BASED_OUTPATIENT_CLINIC_OR_DEPARTMENT_OTHER): Payer: Self-pay | Admitting: *Deleted

## 2016-11-09 DIAGNOSIS — J45909 Unspecified asthma, uncomplicated: Secondary | ICD-10-CM | POA: Diagnosis not present

## 2016-11-09 DIAGNOSIS — G43009 Migraine without aura, not intractable, without status migrainosus: Secondary | ICD-10-CM

## 2016-11-09 DIAGNOSIS — Z79899 Other long term (current) drug therapy: Secondary | ICD-10-CM | POA: Diagnosis not present

## 2016-11-09 DIAGNOSIS — I10 Essential (primary) hypertension: Secondary | ICD-10-CM | POA: Insufficient documentation

## 2016-11-09 DIAGNOSIS — R51 Headache: Secondary | ICD-10-CM | POA: Diagnosis present

## 2016-11-09 DIAGNOSIS — E860 Dehydration: Secondary | ICD-10-CM

## 2016-11-09 HISTORY — DX: Calculus of kidney: N20.0

## 2016-11-09 LAB — CBC WITH DIFFERENTIAL/PLATELET
BASOS PCT: 0 %
Basophils Absolute: 0 10*3/uL (ref 0.0–0.1)
EOS ABS: 0 10*3/uL (ref 0.0–0.7)
EOS PCT: 0 %
HCT: 43.1 % (ref 36.0–46.0)
Hemoglobin: 14.9 g/dL (ref 12.0–15.0)
LYMPHS ABS: 0.8 10*3/uL (ref 0.7–4.0)
Lymphocytes Relative: 7 %
MCH: 26.7 pg (ref 26.0–34.0)
MCHC: 34.6 g/dL (ref 30.0–36.0)
MCV: 77.2 fL — ABNORMAL LOW (ref 78.0–100.0)
Monocytes Absolute: 0.4 10*3/uL (ref 0.1–1.0)
Monocytes Relative: 4 %
Neutro Abs: 10.3 10*3/uL — ABNORMAL HIGH (ref 1.7–7.7)
Neutrophils Relative %: 89 %
PLATELETS: 363 10*3/uL (ref 150–400)
RBC: 5.58 MIL/uL — AB (ref 3.87–5.11)
RDW: 15.2 % (ref 11.5–15.5)
WBC: 11.5 10*3/uL — AB (ref 4.0–10.5)

## 2016-11-09 LAB — URINALYSIS, ROUTINE W REFLEX MICROSCOPIC
Bilirubin Urine: NEGATIVE
Glucose, UA: NEGATIVE mg/dL
Ketones, ur: >80 mg/dL — AB
LEUKOCYTES UA: NEGATIVE
NITRITE: NEGATIVE
PH: 7.5 (ref 5.0–8.0)
Protein, ur: NEGATIVE mg/dL
Specific Gravity, Urine: 1.016 (ref 1.005–1.030)

## 2016-11-09 LAB — COMPREHENSIVE METABOLIC PANEL
ALBUMIN: 4.2 g/dL (ref 3.5–5.0)
ALT: 49 U/L (ref 14–54)
ANION GAP: 12 (ref 5–15)
AST: 42 U/L — ABNORMAL HIGH (ref 15–41)
Alkaline Phosphatase: 81 U/L (ref 38–126)
BUN: 15 mg/dL (ref 6–20)
CO2: 24 mmol/L (ref 22–32)
CREATININE: 1.15 mg/dL — AB (ref 0.44–1.00)
Calcium: 8.9 mg/dL (ref 8.9–10.3)
Chloride: 99 mmol/L — ABNORMAL LOW (ref 101–111)
GFR calc non Af Amer: 56 mL/min — ABNORMAL LOW (ref >60)
Glucose, Bld: 116 mg/dL — ABNORMAL HIGH (ref 65–99)
Potassium: 3.7 mmol/L (ref 3.5–5.1)
SODIUM: 135 mmol/L (ref 135–145)
Total Bilirubin: 1.8 mg/dL — ABNORMAL HIGH (ref 0.3–1.2)
Total Protein: 8.5 g/dL — ABNORMAL HIGH (ref 6.5–8.1)

## 2016-11-09 LAB — PREGNANCY, URINE: Preg Test, Ur: NEGATIVE

## 2016-11-09 LAB — URINALYSIS, MICROSCOPIC (REFLEX)

## 2016-11-09 LAB — I-STAT CG4 LACTIC ACID, ED: LACTIC ACID, VENOUS: 1.39 mmol/L (ref 0.5–1.9)

## 2016-11-09 LAB — TROPONIN I

## 2016-11-09 MED ORDER — ONDANSETRON 4 MG PO TBDP: 4.0000 mg | ORAL_TABLET | Freq: Three times a day (TID) | ORAL | 0 refills | Status: DC | PRN

## 2016-11-09 MED ORDER — PROCHLORPERAZINE EDISYLATE 5 MG/ML IJ SOLN
10.0000 mg | Freq: Once | INTRAMUSCULAR | Status: AC
  Administered 2016-11-09: 10 mg via INTRAVENOUS
  Filled 2016-11-09: qty 2

## 2016-11-09 MED ORDER — SODIUM CHLORIDE 0.9 % IV BOLUS (SEPSIS)
1000.0000 mL | Freq: Once | INTRAVENOUS | Status: AC
  Administered 2016-11-09: 1000 mL via INTRAVENOUS

## 2016-11-09 MED ORDER — DIPHENHYDRAMINE HCL 50 MG/ML IJ SOLN
25.0000 mg | Freq: Once | INTRAMUSCULAR | Status: AC
  Administered 2016-11-09: 25 mg via INTRAVENOUS
  Filled 2016-11-09: qty 1

## 2016-11-09 NOTE — ED Notes (Signed)
ED Provider at bedside. 

## 2016-11-09 NOTE — ED Notes (Signed)
Patient transported to CT 

## 2016-11-09 NOTE — ED Notes (Signed)
Patient is resting comfortably. 

## 2016-11-09 NOTE — ED Notes (Signed)
Patient transported to X-ray 

## 2016-11-09 NOTE — ED Notes (Signed)
Given apple juice, tol well

## 2016-11-09 NOTE — ED Provider Notes (Signed)
By signing my name below, I, Dora Sims, attest that this documentation has been prepared under the direction and in the presence of physician practitioner, Leah Skora, Delice Bison, DO. Electronically Signed: Dora Sims, Scribe. 11/09/2016. 12:43 AM.  TIME SEEN: 12:32 AM  CHIEF COMPLAINT: Hypertension; Headache  HPI: HPI Comments: Lauren Mcclure is a 45 y.o. female with a history of migraines and kidney stones who presents to the Emergency Department complaining of a persistent, severe headache since yesterday afternoon. She underwent lithotripsy for a right-sided stone at 2:30 PM on 8/6 with Dr. Estill Dooms at [facility]. She states it went well without complication but she is still having some intermittent persistent right-sided flank pain for which she is taking hydrocodone. Approximately one hour after the procedure she developed a significant headache that has persisted. The headache feels like her chronic migraines. She describes it as diffuse, throbbing without radiation. She has had nausea and vomiting and photophobia. She has had some occasional chills and mild dyspnea in association with her headache. Patient states that her blood pressure has been elevated since the procedure. Her BP was measured at 185/111 in the ED tonight and is typically measured around 130/90. Patient has not missed any doses of her amlodipine within the last week and last took it on the morning of 8/6. Her pre-existing right flank pain has persisted since the operation; she was sent home with hydrocodone and last took it two hours ago with some relief. She has also continued to have hematuria. No recent head injuries or tick bites. Not on antiplatelets or anticoagulants. No known ill contacts. She took a trip to the [place] one month ago but otherwise denies any recent foreign travel.  Patient denies gait disturbance, visual changes, numbness/tingling, focal weakness, fevers, cough, chest pain, or any other associated symptoms.    ROS: See HPI Constitutional: no fever  Eyes: no drainage  ENT: no runny nose   Cardiovascular:  no chest pain  Resp: SOB  GI: no vomiting GU: no dysuria Integumentary: no rash  Allergy: no hives  Musculoskeletal: no leg swelling  Neurological: no slurred speech ROS otherwise negative  PAST MEDICAL HISTORY/PAST SURGICAL HISTORY:  Past Medical History:  Diagnosis Date  . Asthma    as a teenager  . Elevated blood pressure reading   . Headache   . History of hyperlipidemia   . Kidney stones   . 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 Authorizing Provider  HYDROcodone-acetaminophen (NORCO/VICODIN) 5-325 MG tablet Take 1 tablet by mouth every 6 (six) hours as needed for moderate pain.   Yes [provider]  amLODipine (NORVASC) 2.5 MG tablet Take 1 tablet (2.5 mg total) by mouth daily. 03/25/16   Copland, Gay Filler, MD  Ascorbic Acid (VITAMIN C PO) Take 1 tablet by mouth daily.    [provider]  celecoxib (CELEBREX) 200 MG capsule Take 1 capsule (200 mg total) by mouth daily. Use as needed for knee pain 03/25/16   Copland, Gay Filler, MD  Cholecalciferol (VITAMIN D PO) Take 1 tablet by mouth daily.    [provider]  CRANBERRY PO Take 1 tablet by mouth daily.    [provider]  ketorolac (TORADOL) 10 MG tablet Take 1 tablet (10 mg total) by mouth every 6 (six) hours as needed. 11/04/16   Caccavale, Sophia, PA-C  TURMERIC PO Take  1 tablet by mouth daily.    [provider]    ALLERGIES:  Allergies  Allergen Reactions  . Rocephin [Ceftriaxone Sodium In Dextrose] Rash    SOCIAL HISTORY:  Social History  Substance Use Topics  . Smoking status: Never Smoker  . Smokeless tobacco: Never Used  . Alcohol use Yes     Comment: Occ wine or beer    FAMILY HISTORY: Family History  Problem  Relation Age of Onset  . Hypertension Mother   . Hyperlipidemia Mother   . Pancreatic cancer Father        deceased age 50  . Cancer - Other Paternal Grandmother   . Skin cancer Paternal Grandfather   . Heart disease Paternal Grandfather   . Hyperlipidemia Maternal Aunt     EXAM: BP (!) 185/111   Pulse 95   Temp 99.9 F (37.7 C) (Oral)   Resp 16   Ht 5\' 6"  (1.676 m)   Wt 163 lb (73.9 kg)   LMP 10/05/2016   SpO2 97%   BMI 26.31 kg/m  CONSTITUTIONAL: Alert and oriented and responds appropriately to questions. Well-appearing; well-nourished, afebrile and nontoxic HEAD: Normocephalic, atraumatic EYES: Conjunctivae clear, pupils appear equal, EOMI, photophobia ENT: normal nose; moist mucous membranes NECK: Supple, no meningismus, no nuchal rigidity, no LAD, no midline spinal tenderness or step-off or deformity  CARD: RRR; S1 and S2 appreciated; no murmurs, no clicks, no rubs, no gallops RESP: Normal chest excursion without splinting or tachypnea; breath sounds clear and equal bilaterally; no wheezes, no rhonchi, no rales, no hypoxia or respiratory distress, speaking full sentences ABD/GI: Normal bowel sounds; non-distended; soft, non-tender, no rebound, no guarding, no peritoneal signs, no hepatosplenomegaly BACK:  The back appears normal and patient does have some mild right flank pain on exam EXT: Normal ROM in all joints; non-tender to palpation; no edema; normal capillary refill; no cyanosis, no calf tenderness or swelling    SKIN: Normal color for age and race; warm; no rash NEURO: Moves all extremities equally; Strength 5/5 in all four extremities.  Normal sensation diffusely.  CN 2-12 grossly intact.  Normal speech.  Normal gait. PSYCH: The patient's mood and manner are appropriate. Grooming and personal hygiene are appropriate.  MEDICAL DECISION MAKING: Patient here with complaints of headache. She thinks that this may be similar to her prior migraines but she is concerned  that her blood pressure is elevated. He does have a low-grade oral temperature here of 99.9. No sign of meningismus on exam. She is neurologically intact but unfortunately is a very poor historian. We'll obtain a rectal temperature and a septic workup including lactate. We'll hold off on antibiotics until we know if she is truly febrile or not. Will obtain a head CT but my suspicion for intracranial hemorrhage, meningitis, stroke is very low. She is neurologically intact here. We'll obtain labs, urine. Will treat her headache with IV fluids, Compazine and Benadryl. Will monitor her blood pressure closely. EKG shows no ischemic abnormality.  ED PROGRESS: Patient's rectal temperature is normal at 99.1. She has had hydrocodone at home that has Tylenol in it prior to arrival but denies any fevers at home. Her labs do show mild leukocytosis with left shift which may be reactive. She reported feeling mildly short of breath we obtain a chest x-ray which showed no infiltrate. She's not had any cough. Her lactate is normal. Urine shows blood which is to be expected after her lithotripsy but also large ketones. Will give her second  liter of IV fluids. Her pregnancy test is negative. Head CT is also completely normal. I went to reevaluate the patient and her headache is almost completely gone after migraine cocktail. She still has normal range of motion in her neck and no signs of meningismus on exam. Again very low suspicion for intracranial hemorrhage and meningitis today and I do not feel she needs a lumbar puncture emergently. I feel that this is headache is related to her migraines which is likely triggered by the fact that she is dehydrated today. Has been reports that this is an issue for her and states that she never drinks enough water. Her blood pressures come down as her symptoms have improved and I feel that her hypertension was likely secondary to her pain rather than her blood pressure causing her headache  today. I have recommend close follow-up with her primary care physician however if her blood pressure continues to be elevated but have recommended she continue her amlodipine as prescribed. I feel she is safe to be discharged home with her husband. We'll discharge with prescription of Zofran to take as needed. She has hydrocodone at home. She will follow-up with her urologist as scheduled. No sign of urinary tract infection and I do not feel she needs to be started on antibiotics.   At this time, I do not feel there is any life-threatening condition present. I have reviewed and discussed all results (EKG, imaging, lab, urine as appropriate) and exam findings with patient/family. I have reviewed nursing notes and appropriate previous records.  I feel the patient is safe to be discharged home without further emergent workup and can continue workup as an outpatient as needed. Discussed usual and customary return precautions. Patient/family verbalize understanding and are comfortable with this plan.  Outpatient follow-up has been provided if needed. All questions have been answered.    EKG Interpretation  Date/Time:  Tuesday November 09 2016 00:26:40 EDT Ventricular Rate:  92 PR Interval:    QRS Duration: 101 QT Interval:  391 QTC Calculation: 484 R Axis:   85 Text Interpretation:  Sinus rhythm Minimal ST elevation, anterior leads Baseline wander in lead(s) II aVR No old tracing to compare Confirmed by Hjalmar Ballengee, Cyril Mourning [id number]) on 11/09/2016 12:31:15 AM        I personally performed the services described in this documentation, which was scribed in my presence. The recorded information has been reviewed and is accurate.    Jovanny Stephanie, Delice Bison, DO 11/09/16 [phone]

## 2016-11-09 NOTE — ED Triage Notes (Signed)
Pt c/o increased bp after lithotripsy today, also c/o h/a n/v

## 2016-11-09 NOTE — Discharge Instructions (Addendum)
You may continue your hydrocodone at home as prescribed for pain.   Your labs and urine today reassuring but did show dehydration. You need to increase your water intake and try to drink at least 8 eight ounce glasses of water a day.   I suspect you were having a migraine headache and that this is what caused your blood pressure to be elevated. I recommend you follow-up closely with your primary care physician if your blood pressure continues to be elevated.

## 2016-11-13 NOTE — Progress Notes (Addendum)
[facility] [place], [place], [place] [address] [id number] [phone]  Date:  11/15/2016   Name:  Lauren Mcclure   DOB:  1970-04-29   MRN:  [id number]  PCP:  Darreld Mclean, MD    Chief Complaint: Follow-up (ER follow up,pain in right side today)   History of Present Illness:  Lauren Mcclure is a 46 y.o. very pleasant female patient who presents with the following:  Here today for a follow-up visit I last saw her about 8 months ago to follow-up on her HTN She was then in the ER on 8/7with the following:  Lauren Mcclure is a 46 y.o. female with a history of migraines and kidney stones who presents to the Emergency Department complaining of a persistent, severe headache since yesterday afternoon. She underwent lithotripsy for a right-sided stone at 2:30 PM on 8/6 with Dr. Estill Dooms at [facility]. She states it went well without complication but she is still having some intermittent persistent right-sided flank pain for which she is taking hydrocodone. Approximately one hour after the procedure she developed a significant headache that has persisted. The headache feels like her chronic migraines. She describes it as diffuse, throbbing without radiation. She has had nausea and vomiting and photophobia. She has had some occasional chills and mild dyspnea in association with her headache. Patient states that her blood pressure has been elevated since the procedure. Her BP was measured at 185/111 in the ED tonight and is typically measured around 130/90. Patient has not missed any doses of her amlodipine within the last week and last took it on the morning of 8/6. Her pre-existing right flank pain has persisted since the operation; she was sent home with hydrocodone and last took it two hours ago with some relief. She has also continued to have hematuria. No recent head injuries or tick bites. Not on antiplatelets or anticoagulants. No known ill  contacts. She took a trip to the [place] one month ago but otherwise denies any recent foreign travel.  Patient denies gait disturbance, visual changes, numbness/tingling, focal weakness, fevers, cough, chest pain, or any other associated symptoms.   She was treated with a migraine cocktail and her sx got much better, her BP came back down with pain relief and she was released to home BP Readings from Last 3 Encounters:  11/15/16 (!) 157/114  11/09/16 136/84  11/04/16 (!) 161/99   She is feeling pretty well today, but she continues to have elevated BP which is worrying her She is taking her amlodipine 2.5 mg faithfully Notes that her BP yesterday was 182/112 We started her on amlodipine just last December for her BP she feels like she has been a bit lightheaded recently but not dizzy She is seeing her urologist in a week for follow-up  However shestill notes some urinary frequency and pain in her right side She is not sure about blood in her urine as she is on her menses right now She has a little burning with urination as well  She is trying to watch her weight and her salt intake in hopes of controlling her BP naturally  No fever or chills No diarrhea  Patient Active Problem List   Diagnosis Date Noted  . Elevated blood pressure reading   . Borderline blood pressure 02/19/2016    Past Medical History:  Diagnosis Date  . Asthma    as a teenager  . Elevated blood pressure reading   .  Headache   . History of hyperlipidemia   . Kidney stones   . 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 LITHOTRIPSY    . NASAL SEPTUM SURGERY  2007  . TONSILLECTOMY      Social History  Substance Use Topics  . Smoking status: Never Smoker  . Smokeless tobacco: Never Used  . Alcohol use Yes     Comment: Occ wine or beer    Family History  Problem Relation Age of Onset  .  Hypertension Mother   . Hyperlipidemia Mother   . Pancreatic cancer Father        deceased age 70  . Cancer - Other Paternal Grandmother   . Skin cancer Paternal Grandfather   . Heart disease Paternal Grandfather   . Hyperlipidemia Maternal Aunt     Allergies  Allergen Reactions  . Rocephin [Ceftriaxone Sodium In Dextrose] Rash    Medication list has been reviewed and updated.  Current Outpatient Prescriptions on File Prior to Visit  Medication Sig Dispense Refill  . amLODipine (NORVASC) 2.5 MG tablet Take 1 tablet (2.5 mg total) by mouth daily. 90 tablet 3  . Ascorbic Acid (VITAMIN C PO) Take 1 tablet by mouth daily.    . celecoxib (CELEBREX) 200 MG capsule Take 1 capsule (200 mg total) by mouth daily. Use as needed for knee pain 90 capsule 3  . CRANBERRY PO Take 1 tablet by mouth daily.    Marland Kitchen ketorolac (TORADOL) 10 MG tablet Take 1 tablet (10 mg total) by mouth every 6 (six) hours as needed. 20 tablet 0  . ondansetron (ZOFRAN ODT) 4 MG disintegrating tablet Take 1 tablet (4 mg total) by mouth every 8 (eight) hours as needed for nausea or vomiting. 20 tablet 0  . TURMERIC PO Take 1 tablet by mouth daily.     No current facility-administered medications on file prior to visit.     Review of Systems:  As per HPI- otherwise negative. No fever She did have vomiting right after her lithotripsy- non since then  Her appetite is normal currently  Physical Examination: Vitals:   11/15/16 1016 11/15/16 1020  BP: (!) 157/105 (!) 157/114  Pulse: 74   Temp: 98.2 F (36.8 C)   SpO2: 98%    Vitals:   11/15/16 1016  Weight: 162 lb 12.8 oz (73.8 kg)  Height: 5' 6.5" (1.689 m)   Body mass index is 25.88 kg/m. Ideal Body Weight: Weight in (lb) to have BMI = 25: 156.9  GEN: WDWN, NAD, Non-toxic, A & O x 3, looks well HEENT: Atraumatic, Normocephalic. Neck supple. No masses, No LAD. Ears and Nose: No external deformity. CV: RRR, No M/G/R. No JVD. No thrill. No extra heart  sounds. PULM: CTA B, no wheezes, crackles, rhonchi. No retractions. No resp. distress. No accessory muscle use. ABD: S, ND, +BS. No rebound. No HSM.  She notes a slight tenderness to the right of her umbilicus today EXTR: No c/c/e NEURO Normal gait.  PSYCH: Normally interactive. Conversant. Not depressed or anxious appearing.  Calm demeanor.  Mild right CVA tenderness.  Otherwise benign exam today  Results for orders placed or performed in visit on 11/15/16  POCT urinalysis dipstick  Result Value Ref Range   Color, UA straw (A) yellow   Clarity, UA clear clear   Glucose, UA negative negative mg/dL   Bilirubin, UA negative negative  Ketones, POC UA negative negative mg/dL   Spec Grav, UA 1.020 1.010 - 1.025   Blood, UA trace-intact (A) negative   pH, UA 6.0 5.0 - 8.0   Protein Ur, POC trace (A) negative mg/dL   Urobilinogen, UA 0.2 0.2 or 1.0 E.U./dL   Nitrite, UA Negative Negative   Leukocytes, UA Negative Negative    Assessment and Plan: Right flank pain - Plan: POCT urinalysis dipstick, Urine Culture  Elevated blood pressure reading - Plan: amLODipine (NORVASC) 5 MG tablet  Essential hypertension - Plan: amLODipine (NORVASC) 5 MG tablet  BP continues to be too high.  Will increase to amlodipine 5 mg today She will check her BP at home and let me know how she does Her right kidney is still tender following recent lithotripsy Her UA is relatively benign but will check culture for her today and be in touch with her results Suspect mild right belly tenderness is also related to recent procedure but she will alert me if any change or worsening of her sx   Signed Lamar Blinks, MD  8/14- received her report as below, called and LMOM.  Need to treat for possible GBS UTI- however she is ?rocephin allergic, need to discuss further Likely can use levaquin but need to inquire about any possibility of pregnancy as her menses are irregular.   Will try her back  Results for orders  placed or performed in visit on 11/15/16  Urine Culture  Result Value Ref Range   Colony Count 50,000-100,000 CFU/mL    Organism ID, Bacteria GROUP B STREP (S.AGALACTIAE) ISOLATED   POCT urinalysis dipstick  Result Value Ref Range   Color, UA straw (A) yellow   Clarity, UA clear clear   Glucose, UA negative negative mg/dL   Bilirubin, UA negative negative   Ketones, POC UA negative negative mg/dL   Spec Grav, UA 1.020 1.010 - 1.025   Blood, UA trace-intact (A) negative   pH, UA 6.0 5.0 - 8.0   Protein Ur, POC trace (A) negative mg/dL   Urobilinogen, UA 0.2 0.2 or 1.0 E.U./dL   Nitrite, UA Negative Negative   Leukocytes, UA Negative Negative   Received her urine culture- she did grow GBS as above Called her back on 8/15 and was able to reach her She reports that her rocephin allergy occurred in 2004- since then she has used penicillin and/ or amox with no problems.  We will treat her GBS UTI with penicillin in that case

## 2016-11-15 ENCOUNTER — Encounter: Payer: Self-pay | Admitting: Family Medicine

## 2016-11-15 ENCOUNTER — Ambulatory Visit (INDEPENDENT_AMBULATORY_CARE_PROVIDER_SITE_OTHER): Admitting: Family Medicine

## 2016-11-15 VITALS — BP 157/114 | HR 74 | Temp 98.2°F | Ht 66.5 in | Wt 162.8 lb

## 2016-11-15 DIAGNOSIS — R109 Unspecified abdominal pain: Secondary | ICD-10-CM | POA: Diagnosis not present

## 2016-11-15 DIAGNOSIS — I10 Essential (primary) hypertension: Secondary | ICD-10-CM

## 2016-11-15 DIAGNOSIS — N3000 Acute cystitis without hematuria: Secondary | ICD-10-CM

## 2016-11-15 DIAGNOSIS — R030 Elevated blood-pressure reading, without diagnosis of hypertension: Secondary | ICD-10-CM

## 2016-11-15 LAB — POCT URINALYSIS DIP (MANUAL ENTRY)
Bilirubin, UA: NEGATIVE
GLUCOSE UA: NEGATIVE mg/dL
Ketones, POC UA: NEGATIVE mg/dL
Leukocytes, UA: NEGATIVE
Nitrite, UA: NEGATIVE
SPEC GRAV UA: 1.02 (ref 1.010–1.025)
UROBILINOGEN UA: 0.2 U/dL
pH, UA: 6 (ref 5.0–8.0)

## 2016-11-15 MED ORDER — AMLODIPINE BESYLATE 5 MG PO TABS: 5.0000 mg | ORAL_TABLET | Freq: Every day | ORAL | 6 refills | Status: DC

## 2016-11-15 NOTE — Patient Instructions (Addendum)
Please increase your amlodipine to 5 mg a day- you can take 2 of the 2.5 mg pills until you use them up! Please keep an eye on your blood pressure and let me know if you are still running higher than 130/90  I will be in touch with your urine culture asap Let me know if anything changes or gets worse!   Do follow-up with your urologist next week

## 2016-11-16 LAB — URINE CULTURE

## 2016-11-17 MED ORDER — PENICILLIN V POTASSIUM 500 MG PO TABS: 500.0000 mg | ORAL_TABLET | Freq: Three times a day (TID) | ORAL | 0 refills | Status: DC

## 2016-11-17 NOTE — Addendum Note (Signed)
Addended by: Lamar Blinks C on: 11/17/2016 05:38 PM   Modules accepted: Orders

## 2016-12-02 ENCOUNTER — Ambulatory Visit (INDEPENDENT_AMBULATORY_CARE_PROVIDER_SITE_OTHER): Admitting: Family Medicine

## 2016-12-02 VITALS — BP 136/93 | HR 77 | Temp 98.4°F | Ht 66.5 in | Wt 164.8 lb

## 2016-12-02 DIAGNOSIS — N3000 Acute cystitis without hematuria: Secondary | ICD-10-CM | POA: Diagnosis not present

## 2016-12-02 DIAGNOSIS — R350 Frequency of micturition: Secondary | ICD-10-CM | POA: Diagnosis not present

## 2016-12-02 DIAGNOSIS — I10 Essential (primary) hypertension: Secondary | ICD-10-CM

## 2016-12-02 DIAGNOSIS — N200 Calculus of kidney: Secondary | ICD-10-CM | POA: Diagnosis not present

## 2016-12-02 NOTE — Patient Instructions (Addendum)
Your blood pressure looks much better with 5mg  of amlodipine. Continue this dose.  We will repeat your urine culture today to make sure the infection is cleared up Will release you to full duty at work  Take care!  Please see me in about 4 months to follow-up on your blood pressure and labs

## 2016-12-02 NOTE — Progress Notes (Addendum)
[facility] [place], [place], [place] [address] [phone]  Date:  12/02/2016   Name:  Lauren Mcclure   DOB:  08-14-1970   MRN:  [id number]  PCP:  Darreld Mclean, MD    Chief Complaint: POST SURGICAL CLEARANCE   History of Present Illness:  Lauren Mcclure is a 46 y.o. very pleasant female patient who presents with the following:  She needs to be cleared to return to work following her lithotripsy - I last saw her on 8/13, at which time she turned out to have a GBS UTI which we have treated with penicillin  She is feeling much better at this visit Her back is feeling better.   No blood in her urine She has just a touch of urinary discomfort remaining, so we will repeat her urine culture today  She saw her urologist and he "cleared me," there are no more stones present She has no limitations to her physical activity per her report   Her BP looks better today- she is tolerating the amlodipine ok- no foot swelling BP Readings from Last 3 Encounters:  12/02/16 (!) 136/93  11/15/16 (!) 157/114  11/09/16 136/84     Patient Active Problem List   Diagnosis Date Noted  . Elevated blood pressure reading   . Borderline blood pressure 02/19/2016    Past Medical History:  Diagnosis Date  . Asthma    as a teenager  . Elevated blood pressure reading   . Headache   . History of hyperlipidemia   . Kidney stones   . 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 LITHOTRIPSY    . NASAL SEPTUM SURGERY  2007  . TONSILLECTOMY      Social History  Substance Use Topics  . Smoking status: Never Smoker  . Smokeless tobacco: Never Used  . Alcohol use Yes     Comment: Occ wine or beer    Family History  Problem Relation Age of Onset  . Hypertension Mother   . Hyperlipidemia Mother   . Pancreatic cancer  Father        deceased age 108  . Cancer - Other Paternal Grandmother   . Skin cancer Paternal Grandfather   . Heart disease Paternal Grandfather   . Hyperlipidemia Maternal Aunt     Allergies  Allergen Reactions  . Rocephin [Ceftriaxone Sodium In Dextrose] Rash    Medication list has been reviewed and updated.  Current Outpatient Prescriptions on File Prior to Visit  Medication Sig Dispense Refill  . amLODipine (NORVASC) 5 MG tablet Take 1 tablet (5 mg total) by mouth daily. 30 tablet 6  . Ascorbic Acid (VITAMIN C PO) Take 1 tablet by mouth daily.    . celecoxib (CELEBREX) 200 MG capsule Take 1 capsule (200 mg total) by mouth daily. Use as needed for knee pain 90 capsule 3  . CRANBERRY PO Take 1 tablet by mouth daily.    Marland Kitchen ketorolac (TORADOL) 10 MG tablet Take 1 tablet (10 mg total) by mouth every 6 (six) hours as needed. (Patient not taking: Reported on 12/02/2016) 20 tablet 0  . ondansetron (ZOFRAN ODT) 4 MG disintegrating tablet Take 1 tablet (4 mg total) by mouth every 8 (eight) hours as needed for nausea or vomiting. (Patient not taking: Reported on 12/02/2016) 20 tablet 0  .  penicillin v potassium (VEETID) 500 MG tablet Take 1 tablet (500 mg total) by mouth 3 (three) times daily. Take for 5 days (Patient not taking: Reported on 12/02/2016) 15 tablet 0  . TURMERIC PO Take 1 tablet by mouth daily.     No current facility-administered medications on file prior to visit.     Review of Systems:  As per HPI- otherwise negative.   Physical Examination: Vitals:   12/02/16 1131  BP: (!) 136/93  Pulse: 77  Temp: 98.4 F (36.9 C)  SpO2: 98%   Vitals:   12/02/16 1131  Weight: 164 lb 12.8 oz (74.8 kg)  Height: 5' 6.5" (1.689 m)   Body mass index is 26.2 kg/m. Ideal Body Weight: Weight in (lb) to have BMI = 25: 156.9  GEN: WDWN, NAD, Non-toxic, A & O x 3, looks well HEENT: Atraumatic, Normocephalic. Neck supple. No masses, No LAD. Ears and Nose: No external deformity. CV:  RRR, No M/G/R. No JVD. No thrill. No extra heart sounds. PULM: CTA B, no wheezes, crackles, rhonchi. No retractions. No resp. distress. No accessory muscle use. ABD: S, NT, ND, +BS. No rebound. No HSM.  Belly is benign, right CVA tenderness from last visit is resolved  EXTR: No c/c/e NEURO Normal gait.  PSYCH: Normally interactive. Conversant. Not depressed or anxious appearing.  Calm demeanor.    Assessment and Plan: Urinary frequency - Plan: Urine Culture  Right nephrolithiasis  Essential hypertension  BP is better on current dose of amlodipine Cleared to return to work  Urine culture pending Recheck in 4 months for BP check and labs   Signed Lamar Blinks, MD  Received her urine culture 9/1 She is not allergic to penicillin  Results for orders placed or performed in visit on 12/02/16  Urine Culture  Result Value Ref Range   Colony Count 10,000-50,000 CFU/mL    Organism ID, Bacteria GROUP B STREP (S.AGALACTIAE) ISOLATED    GPS persists  It took a few days but was able to reach her on 9/5- advised about positive culture. Will retreat with pencillin for one week.  She states understanding and agreement

## 2016-12-03 LAB — URINE CULTURE

## 2016-12-08 MED ORDER — PENICILLIN V POTASSIUM 500 MG PO TABS: 500.0000 mg | ORAL_TABLET | Freq: Three times a day (TID) | ORAL | 0 refills | Status: DC

## 2016-12-08 NOTE — Addendum Note (Signed)
Addended by: Lamar Blinks C on: 12/08/2016 08:27 PM   Modules accepted: Orders

## 2017-03-21 ENCOUNTER — Ambulatory Visit: Admitting: Family Medicine

## 2017-05-02 ENCOUNTER — Ambulatory Visit: Payer: Self-pay | Admitting: *Deleted

## 2017-05-02 NOTE — Telephone Encounter (Signed)
Pt called with complaints of cold, productive cough; she has frontal sinus pressure rated 10/10 and headache; initially started as a sore throat; she has tried delsym for this but with minimal relief; additionally her blood pressure has been high 162/110 and she is taking her BP medication as she was instructed;  she just came back from [place] a week ago; nurse triage initiated and recommendation made for pt to see physician within 24 hours; pt requested appointment on 05/03/17; pt offered and accepted appointment with Dr Larose Kells 05/03/17 at [facility]; pt verbalizes understanding.   Reason for Disposition . [1] Continuous (nonstop) coughing interferes with work or school AND [2] no improvement using cough treatment per Care Advice  Answer Assessment - Initial Assessment Questions 1. ONSET: "When did the cough begin?"      04/24/17 2. SEVERITY: "How bad is the cough today?"     severe 3. RESPIRATORY DISTRESS: "Describe your breathing."      Shortness of breath when coughing or moving around 4. FEVER: "Do you have a fever?" If so, ask: "What is your temperature, how was it measured, and when did it start?"     no 5. SPUTUM: "Describe the color of your sputum" (clear, white, yellow, green)     Green 6. HEMOPTYSIS: "Are you coughing up any blood?" If so ask: "How much?" (flecks, streaks, tablespoons, etc.)     no 7. CARDIAC HISTORY: "Do you have any history of heart disease?" (e.g., heart attack, congestive heart failure)      no 8. LUNG HISTORY: "Do you have any history of lung disease?"  (e.g., pulmonary embolus, asthma, emphysema)     Asthma as a teenager 25. PE RISK FACTORS: "Do you have a history of blood clots?" (or: recent major surgery, recent prolonged travel, bedridden )     Recent prolonged travel 10. OTHER SYMPTOMS: "Do you have any other symptoms?" (e.g., runny nose, wheezing, chest pain)       Sinus pressure and headache rated 10/10; blood tinged nasal secreations 11. PREGNANCY: "Is there  any chance you are pregnant?" "When was your last menstrual period?"       No; irregular 12. TRAVEL: "Have you traveled out of the country in the last month?" (e.g., travel history, exposures)    Yes traveled to [place]  Protocols used: [place]

## 2017-05-03 ENCOUNTER — Ambulatory Visit (INDEPENDENT_AMBULATORY_CARE_PROVIDER_SITE_OTHER): Admitting: Internal Medicine

## 2017-05-03 ENCOUNTER — Encounter: Payer: Self-pay | Admitting: Internal Medicine

## 2017-05-03 VITALS — BP 124/68 | HR 87 | Temp 98.1°F | Resp 14 | Ht 66.5 in | Wt 170.2 lb

## 2017-05-03 DIAGNOSIS — R030 Elevated blood-pressure reading, without diagnosis of hypertension: Secondary | ICD-10-CM | POA: Diagnosis not present

## 2017-05-03 DIAGNOSIS — J019 Acute sinusitis, unspecified: Secondary | ICD-10-CM | POA: Diagnosis not present

## 2017-05-03 MED ORDER — PREDNISONE 10 MG PO TABS
ORAL_TABLET | ORAL | 0 refills | Status: DC
Start: 2017-05-03 — End: 2017-05-09

## 2017-05-03 MED ORDER — AZITHROMYCIN 250 MG PO TABS: ORAL_TABLET | ORAL | 0 refills | Status: DC

## 2017-05-03 NOTE — Progress Notes (Signed)
Pre visit review using our clinic review tool, if applicable. No additional management support is needed unless otherwise documented below in the visit note. 

## 2017-05-03 NOTE — Telephone Encounter (Signed)
FYI. Pt has appt w/ Dr. Larose Kells today.

## 2017-05-03 NOTE — Progress Notes (Signed)
Subjective:    Patient ID: Lauren Mcclure, female    DOB: 1971-02-17, 47 y.o.   MRN: [id number]  DOS:  05/03/2017 Type of visit - description :  acute Interval history: Symptoms started 8 days ago when she was flying back from [place]: Reports she developed a cold w/ cough, sinus pain and congestion which has been severe (sinus pan: 10 / 10). Also  sore throat (getting better), taking OTC Delsym which helped with the cough. Her BP has been a little high, yesterday 162/110.  Review of Systems + Nasal discharge, mostly clear but has seen traces of blood. Had subjective fever and chills the first 3 days of symptoms, no myalgias but some arthralgias. No rash Some anterior chest pain with cough only.  No shortness of breath or leg swelling.  Past Medical History:  Diagnosis Date  . Asthma    as a teenager  . Elevated blood pressure reading   . Headache   . History of hyperlipidemia   . Kidney stones   . 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 LITHOTRIPSY    . NASAL SEPTUM SURGERY  2007  . TONSILLECTOMY      Social History   Socioeconomic History  . Marital status: Married    Spouse name: Not on file  . Number of children: Not on file  . Years of education: Not on file  . Highest education level: Not on file  Social Needs  . Financial resource strain: Not on file  . Food insecurity - worry: Not on file  . Food insecurity - inability: Not on file  . Transportation needs - medical: Not on file  . Transportation needs - non-medical: Not on file  Occupational History  . Not on file  Tobacco Use  . Smoking status: Never Smoker  . Smokeless tobacco: Never Used  Substance and Sexual Activity  . Alcohol use: Yes    Comment: Occ wine or beer  . Drug use: No  . Sexual activity: Not on file  Other Topics Concern  . Not on file  Social History Narrative  . Not  on file      Allergies as of 05/03/2017      Reactions   Rocephin [ceftriaxone Sodium In Dextrose] Rash      Medication List        Accurate as of 05/03/17 11:59 PM. Always use your most recent med list.          amLODipine 5 MG tablet Commonly known as:  NORVASC Take 1 tablet (5 mg total) by mouth daily.   azithromycin 250 MG tablet Commonly known as:  ZITHROMAX Z-PAK 2 tabs a day the first day, then 1 tab a day x 4 days   celecoxib 200 MG capsule Commonly known as:  CELEBREX Take 1 capsule (200 mg total) by mouth daily. Use as needed for knee pain   CRANBERRY PO Take 1 tablet by mouth daily.   predniSONE 10 MG tablet Commonly known as:  DELTASONE 2 tabs a day x 5 days   TURMERIC PO Take 1 tablet by mouth daily.   VITAMIN C PO Take 1 tablet by mouth daily.          Objective:   Physical Exam BP 124/68 (BP Location: Left Arm, Patient Position: Sitting, Cuff Size: Small)   Pulse 87   Temp 98.1 F (36.7  C) (Oral)   Resp 14   Ht 5' 6.5" (1.689 m)   Wt 170 lb 4 oz (77.2 kg)   SpO2 97%   BMI 27.07 kg/m  General:   Well developed, well nourished . NAD.  HEENT:  Normocephalic . Face symmetric, atraumatic. Throat symmetric, no red.  TMs are slightly bulge.  Minimal redness. Nose congested, sinuses: TTP only on the right side, mild. Lungs:  CTA B Normal respiratory effort, no intercostal retractions, no accessory muscle use. Heart: RRR,  no murmur.  No pretibial edema bilaterally  Skin: Not pale. Not jaundice Neurologic:  alert & oriented X3.  Speech normal, gait appropriate for age and unassisted Psych--  Cognition and judgment appear intact.  Cooperative with normal attention span and concentration.  Behavior appropriate. No anxious or depressed appearing.      Assessment & Plan:   47 year old female with history of elevated BP, on amlodipine, presents with : Acute sinusitis: Sx c/w acute sinusitis, she is allergic to Rocephin. Will prescribe  prednisone, for 5 days only.  Continue with cough suppressant, Flonase and Z-Pak.  Call if no better. Elevated BP: BP today is normal, on amlodipine, recommend to avoid decongestants

## 2017-05-03 NOTE — Patient Instructions (Signed)
Rest, fluids , tylenol  For cough:  Take Mucinex DM twice a day as needed until better (or continue Delsym )  For nasal congestion: Use OTC   Flonase : 2 nasal sprays on each side of the nose in the morning until you feel better  Prednisone x 5 days   Avoid decongestants such as  Pseudoephedrine or phenylephrine    Take the antibiotic as prescribed  (zithromax)  Call if not gradually better over the next  10 days  Call anytime if the symptoms are severe

## 2017-05-07 NOTE — Progress Notes (Addendum)
[name] at [facility] [address], [place], [place] [address] [phone]  Date:  05/09/2017   Name:  Lauren Mcclure   DOB:  [birth year]   MRN:  [id number]  PCP:  Darreld Mclean, MD    Chief Complaint: Follow-up (Pt here for 4 month f/u visit. ) and Medication Refill (Pt request med refill on Amlodipine .)   History of Present Illness:  Lauren Mcclure is a 47 y.o. very pleasant female patient who presents with the following:  Follow-up visit on borderline HTN today I last saw her in August:  She needs to be cleared to return to work following her lithotripsy - I last saw her on 8/13, at which time she turned out to have a GBS UTI which we have treated with penicillin  She is feeling much better at this visit Her back is feeling better.   No blood in her urine She has just a touch of urinary discomfort remaining, so we will repeat her urine culture today She saw her urologist and he "cleared me," there are no more stones present She has no limitations to her physical activity per her report  Her BP looks better today- she is tolerating the amlodipine ok- no foot swelling  Flu: done  Pap:she is due but does not want to do today  She is concerned that her BP is still too high- may be up to 162/110 when she checks it at home She has not noted any SE of her amlodipine. No swelling of her LE  She is trying to lose weight She has gained about 20 lbs over the last year or so due to giving up her daily tennis due to her work schedule. She is not really exercising right now Wonders about weight loss drugs, but I counseled her that these would not be advised at this time   Wt Readings from Last 3 Encounters:  05/09/17 168 lb 3.2 oz (76.3 kg)  05/03/17 170 lb 4 oz (77.2 kg)  12/02/16 164 lb 12.8 oz (74.8 kg)     BP Readings from Last 3 Encounters:  05/09/17 (!) 144/100  05/03/17 124/68  12/02/16 (!) 136/93     Patient  Active Problem List   Diagnosis Date Noted  . Elevated blood pressure reading   . Borderline blood pressure 02/19/2016    Past Medical History:  Diagnosis Date  . Asthma    as a teenager  . Elevated blood pressure reading   . Headache   . History of hyperlipidemia   . Kidney stones   . 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 LITHOTRIPSY    . NASAL SEPTUM SURGERY  2007  . TONSILLECTOMY      Social History   Tobacco Use  . Smoking status: Never Smoker  . Smokeless tobacco: Never Used  Substance Use Topics  . Alcohol use: Yes    Comment: Occ wine or beer  . Drug use: No    Family History  Problem Relation Age of Onset  . Hypertension Mother   . Hyperlipidemia Mother   . Pancreatic cancer Father        deceased age 51  . Cancer - Other Paternal Grandmother   . Skin cancer Paternal Grandfather   . Heart disease Paternal Grandfather   . Hyperlipidemia Maternal Aunt  Allergies  Allergen Reactions  . Rocephin [Ceftriaxone Sodium In Dextrose] Rash    Medication list has been reviewed and updated.  Current Outpatient Medications on File Prior to Visit  Medication Sig Dispense Refill  . amLODipine (NORVASC) 5 MG tablet Take 1 tablet (5 mg total) by mouth daily. 30 tablet 6  . Ascorbic Acid (VITAMIN C PO) Take 1 tablet by mouth daily.    . celecoxib (CELEBREX) 200 MG capsule Take 1 capsule (200 mg total) by mouth daily. Use as needed for knee pain 90 capsule 3  . CRANBERRY PO Take 1 tablet by mouth daily.    . TURMERIC PO Take 1 tablet by mouth daily.     No current facility-administered medications on file prior to visit.     Review of Systems:  As per HPI- otherwise negative.  No fever or chills No CP or SOB Physical Examination: Vitals:   05/09/17 1000  BP: (!) 144/100  Pulse: 91  Temp: 98.1 F (36.7 C)  SpO2: 98%   Vitals:   05/09/17  1000  Weight: 168 lb 3.2 oz (76.3 kg)  Height: 5\' 6"  (1.676 m)   Body mass index is 27.15 kg/m. Ideal Body Weight: Weight in (lb) to have BMI = 25: 154.6  GEN: WDWN, NAD, Non-toxic, A & O x 3, overweight, looks well  HEENT: Atraumatic, Normocephalic. Neck supple. No masses, No LAD. Ears and Nose: No external deformity. CV: RRR, No M/G/R. No JVD. No thrill. No extra heart sounds. PULM: CTA B, no wheezes, crackles, rhonchi. No retractions. No resp. distress. No accessory muscle use. EXTR: No c/c/e NEURO Normal gait.  PSYCH: Normally interactive. Conversant. Not depressed or anxious appearing.  Calm demeanor.    Assessment and Plan: Essential hypertension - Plan: CBC, Comprehensive metabolic panel, amLODipine (NORVASC) 5 MG tablet  Screening for breast cancer - Plan: MM SCREENING BREAST TOMO BILATERAL  Medication monitoring encounter  Weight gain  Ordered mammogram, she will see if this can be done today Asked her to come back for a pap soon Increase amlodipine to 7.5 mg a day.  She will start to work on losing the weight she gained through exercise and dietary changes.  Counseled that weight loss meds are not indicated for her at this time  See patient instructions for more details.   Will plan further follow- up pending labs.   Signed Lamar Blinks, MD  Received her labs 2/5  Results for orders placed or performed in visit on 05/09/17  CBC  Result Value Ref Range   WBC 12.0 (H) 4.0 - 10.5 K/uL   RBC 5.35 (H) 3.87 - 5.11 Mil/uL   Platelets 340.0 150.0 - 400.0 K/uL   Hemoglobin 14.9 12.0 - 15.0 g/dL   HCT 44.3 36.0 - 46.0 %   MCV 82.7 78.0 - 100.0 fl   MCHC 33.7 30.0 - 36.0 g/dL   RDW 15.6 (H) 11.5 - 15.5 %  Comprehensive metabolic panel  Result Value Ref Range   Sodium 138 135 - 145 mEq/L   Potassium 3.6 3.5 - 5.1 mEq/L   Chloride 104 96 - 112 mEq/L   CO2 27 19 - 32 mEq/L   Glucose, Bld 101 (H) 70 - 99 mg/dL   BUN 15 6 - 23 mg/dL   Creatinine, Ser 0.87 0.40  - 1.20 mg/dL   Total Bilirubin 1.3 (H) 0.2 - 1.2 mg/dL   Alkaline Phosphatase 96 39 - 117 U/L   AST 37 0 - 37 U/L  ALT 86 (H) 0 - 35 U/L   Total Protein 7.1 6.0 - 8.3 g/dL   Albumin 4.0 3.5 - 5.2 g/dL   Calcium 9.1 8.4 - 10.5 mg/dL   GFR 73.85 >60.00 mL/min    Her bili has been high before, and we did a fractionation about a year ago However ALT has never been high Also mild leukocytosis for about 6 months Letter to pt  Your bilirubin is high- however, this is mild and not new, and is nothing of concern. One of your other liver function tests- ALT- is also high this time.  You have not shown this finding in the past.  This is likely ok, but I do want to recheck in about one month to follow this number.  Also, your white blood cells count continues to be slightly high.  We will follow-up on this as well!  Please come in for a lab visit only in about one month for repeat labs.  However for the time being there is nothing that you need to do, I expect your ALT and white cell count will likely return to normal.

## 2017-05-09 ENCOUNTER — Ambulatory Visit (INDEPENDENT_AMBULATORY_CARE_PROVIDER_SITE_OTHER): Admitting: Family Medicine

## 2017-05-09 ENCOUNTER — Encounter: Payer: Self-pay | Admitting: Family Medicine

## 2017-05-09 ENCOUNTER — Ambulatory Visit (HOSPITAL_BASED_OUTPATIENT_CLINIC_OR_DEPARTMENT_OTHER)
Admission: RE | Admit: 2017-05-09 | Discharge: 2017-05-09 | Disposition: A | Discharge status: Home / Self Care | Source: Ambulatory Visit | Attending: Family Medicine | Admitting: Family Medicine

## 2017-05-09 VITALS — BP 144/100 | HR 91 | Temp 98.1°F | Ht 66.0 in | Wt 168.2 lb

## 2017-05-09 DIAGNOSIS — R635 Abnormal weight gain: Secondary | ICD-10-CM

## 2017-05-09 DIAGNOSIS — I10 Essential (primary) hypertension: Secondary | ICD-10-CM

## 2017-05-09 DIAGNOSIS — Z1231 Encounter for screening mammogram for malignant neoplasm of breast: Secondary | ICD-10-CM | POA: Diagnosis not present

## 2017-05-09 DIAGNOSIS — Z5181 Encounter for therapeutic drug level monitoring: Secondary | ICD-10-CM

## 2017-05-09 DIAGNOSIS — Z1239 Encounter for other screening for malignant neoplasm of breast: Secondary | ICD-10-CM

## 2017-05-09 DIAGNOSIS — D72829 Elevated white blood cell count, unspecified: Secondary | ICD-10-CM

## 2017-05-09 DIAGNOSIS — R7401 Elevation of levels of liver transaminase levels: Secondary | ICD-10-CM

## 2017-05-09 DIAGNOSIS — R740 Nonspecific elevation of levels of transaminase and lactic acid dehydrogenase [LDH]: Secondary | ICD-10-CM | POA: Diagnosis not present

## 2017-05-09 LAB — CBC
HEMATOCRIT: 44.3 % (ref 36.0–46.0)
Hemoglobin: 14.9 g/dL (ref 12.0–15.0)
MCHC: 33.7 g/dL (ref 30.0–36.0)
MCV: 82.7 fl (ref 78.0–100.0)
PLATELETS: 340 10*3/uL (ref 150.0–400.0)
RBC: 5.35 Mil/uL — ABNORMAL HIGH (ref 3.87–5.11)
RDW: 15.6 % — ABNORMAL HIGH (ref 11.5–15.5)
WBC: 12 10*3/uL — AB (ref 4.0–10.5)

## 2017-05-09 LAB — COMPREHENSIVE METABOLIC PANEL
ALK PHOS: 96 U/L (ref 39–117)
ALT: 86 U/L — ABNORMAL HIGH (ref 0–35)
AST: 37 U/L (ref 0–37)
Albumin: 4 g/dL (ref 3.5–5.2)
BUN: 15 mg/dL (ref 6–23)
CALCIUM: 9.1 mg/dL (ref 8.4–10.5)
CO2: 27 mEq/L (ref 19–32)
CREATININE: 0.87 mg/dL (ref 0.40–1.20)
Chloride: 104 mEq/L (ref 96–112)
GFR: 73.85 mL/min (ref >60.00)
Glucose, Bld: 101 mg/dL — ABNORMAL HIGH (ref 70–99)
POTASSIUM: 3.6 meq/L (ref 3.5–5.1)
Sodium: 138 mEq/L (ref 135–145)
TOTAL PROTEIN: 7.1 g/dL (ref 6.0–8.3)
Total Bilirubin: 1.3 mg/dL — ABNORMAL HIGH (ref 0.2–1.2)

## 2017-05-09 MED ORDER — AMLODIPINE BESYLATE 5 MG PO TABS: 7.5000 mg | ORAL_TABLET | Freq: Every day | ORAL | 11 refills | Status: DC

## 2017-05-09 NOTE — Patient Instructions (Signed)
It was good to see you today!   We are going to increase your amlodipine to 7.5 mg a day to try and bring down your BP a bit Also, please try to work on losing some of the weight you gained since you had to stop tennis.  Running for exercise is a good idea, and also increase your calories per day slightly.  Using an iphone app like myfitnesspal can be helpful!    I will be in touch with your labs You may be able to get your mammogram today if you want to stop by imaging  Please come and see me in 3-4 months- we can check on your BP and do your pap then as well v

## 2017-05-10 NOTE — Addendum Note (Signed)
Addended by: Lamar Blinks C on: 05/10/2017 09:39 PM   Modules accepted: Orders

## 2017-06-07 ENCOUNTER — Other Ambulatory Visit (INDEPENDENT_AMBULATORY_CARE_PROVIDER_SITE_OTHER)

## 2017-06-07 DIAGNOSIS — R740 Nonspecific elevation of levels of transaminase and lactic acid dehydrogenase [LDH]: Secondary | ICD-10-CM | POA: Diagnosis not present

## 2017-06-07 DIAGNOSIS — D72829 Elevated white blood cell count, unspecified: Secondary | ICD-10-CM | POA: Diagnosis not present

## 2017-06-07 DIAGNOSIS — R7401 Elevation of levels of liver transaminase levels: Secondary | ICD-10-CM

## 2017-06-07 LAB — CBC WITH DIFFERENTIAL/PLATELET
BASOS PCT: 0.8 % (ref 0.0–3.0)
Basophils Absolute: 0.1 10*3/uL (ref 0.0–0.1)
EOS PCT: 0.7 % (ref 0.0–5.0)
Eosinophils Absolute: 0.1 10*3/uL (ref 0.0–0.7)
HEMATOCRIT: 41.8 % (ref 36.0–46.0)
HEMOGLOBIN: 14.2 g/dL (ref 12.0–15.0)
LYMPHS PCT: 29.4 % (ref 12.0–46.0)
Lymphs Abs: 2.3 10*3/uL (ref 0.7–4.0)
MCHC: 33.9 g/dL (ref 30.0–36.0)
MCV: 83.5 fl (ref 78.0–100.0)
MONO ABS: 0.7 10*3/uL (ref 0.1–1.0)
MONOS PCT: 9.1 % (ref 3.0–12.0)
Neutro Abs: 4.7 10*3/uL (ref 1.4–7.7)
Neutrophils Relative %: 60 % (ref 43.0–77.0)
Platelets: 323 10*3/uL (ref 150.0–400.0)
RBC: 5.01 Mil/uL (ref 3.87–5.11)
RDW: 14.9 % (ref 11.5–15.5)
WBC: 7.9 10*3/uL (ref 4.0–10.5)

## 2017-06-07 LAB — HEPATIC FUNCTION PANEL
ALBUMIN: 4.1 g/dL (ref 3.5–5.2)
ALT: 45 U/L — AB (ref 0–35)
AST: 28 U/L (ref 0–37)
Alkaline Phosphatase: 70 U/L (ref 39–117)
BILIRUBIN TOTAL: 0.9 mg/dL (ref 0.2–1.2)
Bilirubin, Direct: 0.2 mg/dL (ref 0.0–0.3)
TOTAL PROTEIN: 7.3 g/dL (ref 6.0–8.3)

## 2017-06-08 LAB — PATHOLOGIST SMEAR REVIEW

## 2017-06-14 ENCOUNTER — Encounter: Payer: Self-pay | Admitting: Family Medicine

## 2017-09-08 ENCOUNTER — Encounter: Admitting: Family Medicine

## 2018-03-14 NOTE — Progress Notes (Addendum)
[place] at [facility] [address], [place], [place] [address] [phone]  Date:  03/16/2018   Name:  Lauren Mcclure   DOB:  Jun 07, 1970   MRN:  [id number]  PCP:  Darreld Mclean, MD    Chief Complaint: Knee Pain (09/18, tore left calf muscle, 02/26/18 left knee swelling, stiffness, using ice and taking celebrex, family hx of arthritis and gout)   History of Present Illness:  Lauren Mcclure is a 47 y.o. very pleasant female patient who presents with the following:  Generally healthy lady with history of borderline blood pressure.  She is taking amlodipine. Here today with concern of knee pain. On 10/18 she tore her left calf playing tennis. She has been to see her ortho, Dr. Jule Economy. She healed and went back to playing tennis She was in a tournament in late November.  She was not aware of new injury but since then her left knee has been painful, stiff and swelling up at times Bending the knee can be painful Going up and down stairs can be painful She is using Celebrex which does help some The knee is not popping. It can get stuck or lock sometimes.   She is not sure about instability   Never had any knee surgery   The calf seems to be ok at this time   Pap: she thinks 2 years ago in [place] Flu: offered, she declines  BP Readings from Last 3 Encounters:  03/16/18 (!) 150/100  05/09/17 (!) 144/100  05/03/17 124/68   She is on amlodipine 7.5 mg HTN does run in her family  She checks her home BP- may be as high as 160/110 but other times is much lower No CP   EKG last year, normal  Patient Active Problem List   Diagnosis Date Noted  . Elevated blood pressure reading   . Borderline blood pressure 02/19/2016    Past Medical History:  Diagnosis Date  . Asthma    as a teenager  . Elevated blood pressure reading   . Headache   . History of hyperlipidemia   . Kidney stones   . Positive skin test for  tuberculosis 03/24/2016   Chest X-Ray done 03/24/16 and was negative    Past Surgical History:  Procedure Laterality Date  . LITHOTRIPSY    . NASAL SEPTUM SURGERY  2007  . TONSILLECTOMY      Social History   Tobacco Use  . Smoking status: Never Smoker  . Smokeless tobacco: Never Used  Substance Use Topics  . Alcohol use: Yes    Comment: Occ wine or beer  . Drug use: No    Family History  Problem Relation Age of Onset  . Hypertension Mother   . Hyperlipidemia Mother   . Pancreatic cancer Father        deceased age 5  . Cancer - Other Paternal Grandmother   . Skin cancer Paternal Grandfather   . Heart disease Paternal Grandfather   . Hyperlipidemia Maternal Aunt     Allergies  Allergen Reactions  . Rocephin [Ceftriaxone Sodium In Dextrose] Rash    Medication list has been reviewed and updated.  Current Outpatient Medications on File Prior to Visit  Medication Sig Dispense Refill  . Ascorbic Acid (VITAMIN C PO) Take 1 tablet by mouth daily.    . celecoxib (CELEBREX) 200 MG capsule Take 1 capsule (200 mg total) by mouth daily. Use as needed for knee  pain 90 capsule 3  . CRANBERRY PO Take 1 tablet by mouth daily.    . TURMERIC PO Take 1 tablet by mouth daily.     No current facility-administered medications on file prior to visit.     Review of Systems:  As per HPI- otherwise negative. No fever or chills   Physical Examination: Vitals:   03/16/18 1321  BP: (!) 150/100  Pulse: 75  Resp: 16  SpO2: 98%   Vitals:   03/16/18 1321  Weight: 166 lb (75.3 kg)  Height: 5\' 6"  (1.676 m)   Body mass index is 26.79 kg/m. Ideal Body Weight: Weight in (lb) to have BMI = 25: 154.6  GEN: WDWN, NAD, Non-toxic, A & O x 3, looks well  HEENT: Atraumatic, Normocephalic. Neck supple. No masses, No LAD. Ears and Nose: No external deformity. CV: RRR, No M/G/R. No JVD. No thrill. No extra heart sounds. PULM: CTA B, no wheezes, crackles, rhonchi. No retractions. No resp.  distress. No accessory muscle use. ABD: S, NT, ND, +BS. No rebound. No HSM. EXTR: No c/c/e NEURO Normal gait.  PSYCH: Normally interactive. Conversant. Not depressed or anxious appearing.  Calm demeanor.  Left knee: ligaments are stable.  There is a mild to moderate effusion of the left knee.  No heat, redness, or skin breakdown.  She has mild lateral joint line tenderness.  And also mild tenderness with varus stress.  Assessment and Plan: Acute pain of left knee - Plan: DG Knee Complete 4 Views Left  Essential hypertension - Plan: amLODipine (NORVASC) 10 MG tablet, DISCONTINUED: amLODipine (NORVASC) 5 MG tablet  Here today with concern of left knee pain.  This first occurred after playing tennis, while she was recovering from a calf tear.  The knee does show significant effusion, and I suspect she has a meniscal tear of some type.  She does have an orthopedist, and will follow up with them.  In the meantime encouraged her to take it easy on her activity, use ice, and try compression sleeve. celebrex prn  Blood pressure is elevated today despite 7.5 mg of amlodipine.  Will increase amlodipine to 10, but may need to add a second agent if she does not respond.  She will check her home blood pressures, and send me a message with some readings in about 1 week.  Asked patient to come and see me in 1 to 2 months, so he can do a physical and checkup on her blood pressure.  Signed Lamar Blinks, MD  Received her knee films 12/13- called and LMOM with report  Dg Knee Complete 4 Views Left  Result Date: 03/16/2018 CLINICAL DATA:  Knee pain with swelling EXAM: LEFT KNEE - COMPLETE 4+ VIEW COMPARISON:  03/25/2016 FINDINGS: No fracture or malalignment. Mild patellofemoral degenerative change and medial joint space degenerative change. Small moderate knee effusion. IMPRESSION: 1. Mild degenerative changes with small moderate knee effusion. 2. No acute osseous abnormality Electronically Signed   By: Donavan Foil M.D.   On: 03/16/2018 23:47

## 2018-03-16 ENCOUNTER — Encounter: Payer: Self-pay | Admitting: Family Medicine

## 2018-03-16 ENCOUNTER — Ambulatory Visit (HOSPITAL_BASED_OUTPATIENT_CLINIC_OR_DEPARTMENT_OTHER)
Admission: RE | Admit: 2018-03-16 | Discharge: 2018-03-16 | Disposition: A | Discharge status: Home / Self Care | Source: Ambulatory Visit | Attending: Family Medicine | Admitting: Family Medicine

## 2018-03-16 ENCOUNTER — Ambulatory Visit (INDEPENDENT_AMBULATORY_CARE_PROVIDER_SITE_OTHER): Admitting: Family Medicine

## 2018-03-16 VITALS — BP 150/100 | HR 75 | Resp 16 | Ht 66.0 in | Wt 166.0 lb

## 2018-03-16 DIAGNOSIS — I10 Essential (primary) hypertension: Secondary | ICD-10-CM | POA: Diagnosis not present

## 2018-03-16 DIAGNOSIS — M25562 Pain in left knee: Secondary | ICD-10-CM

## 2018-03-16 MED ORDER — AMLODIPINE BESYLATE 10 MG PO TABS: 10.0000 mg | ORAL_TABLET | Freq: Every day | ORAL | 6 refills | Status: DC

## 2018-03-16 MED ORDER — AMLODIPINE BESYLATE 5 MG PO TABS: 10.0000 mg | ORAL_TABLET | Freq: Every day | ORAL | 11 refills | Status: DC

## 2018-03-16 NOTE — Patient Instructions (Addendum)
It was good to see you again. As your blood pressure still high, blood increase amlodipine to 10 mg once a day.  I sent in a new prescription for the 10 mg strength for you. Please monitor your blood pressure at home, and let me know if you are still running higher than 140/90.  In this case we may need to add a medication, or replace amlodipine with something else. We will get x-rays of your left knee today.  I will be in touch with the report.  For the time being, take it easy on activity and use ice on your knee.  You may use Celebrex as needed.  Please follow-up with orthopedist as well, he may wish to do a steroid injection for you. Please come and see me in 1 to 2 months.  We can do your physical at that time, and catch up on your Pap.  This will also give [place] a chance to check her blood pressure.

## 2018-05-14 NOTE — Progress Notes (Deleted)
[place] at [facility] [address] [place], [place] [address] [phone]  Date:  05/17/2018   Name:  Lauren Mcclure   DOB:  May 05, 1970   MRN:  [id number]  PCP:  Darreld Mclean, MD    Chief Complaint: No chief complaint on file.   History of Present Illness:  Lauren Mcclure is a 48 y.o. very pleasant female patient who presents with the following:  Here today for short-term follow-up-?  Physical I saw her in December, and her blood pressure was noted to be high. At that time we increased her amlodipine from 7.5 to 10 mg, and I asked her to come back and see me in 1 to 2 months for a physical and blood pressure check  Pap: Flu shot: Tetanus vaccine: Labs: Due for complete panel Mammogram: February 2019 Colon cancer screening: Patient Active Problem List   Diagnosis Date Noted  . Elevated blood pressure reading   . Borderline blood pressure 02/19/2016    Past Medical History:  Diagnosis Date  . Asthma    as a teenager  . Elevated blood pressure reading   . Headache   . History of hyperlipidemia   . Kidney stones   . 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 LITHOTRIPSY    . NASAL SEPTUM SURGERY  2007  . TONSILLECTOMY      Social History   Tobacco Use  . Smoking status: Never Smoker  . Smokeless tobacco: Never Used  Substance Use Topics  . Alcohol use: Yes    Comment: Occ wine or beer  . Drug use: No    Family History  Problem Relation Age of Onset  . Hypertension Mother   . Hyperlipidemia Mother   . Pancreatic cancer Father        deceased age 1  . Cancer - Other Paternal Grandmother   . Skin cancer Paternal Grandfather   . Heart disease Paternal Grandfather   . Hyperlipidemia Maternal Aunt     Allergies  Allergen Reactions  . Rocephin [Ceftriaxone Sodium In Dextrose] Rash     Medication list has been reviewed and updated.  Current Outpatient Medications on File Prior to Visit  Medication Sig Dispense Refill  . amLODipine (NORVASC) 10 MG tablet Take 1 tablet (10 mg total) by mouth daily. 30 tablet 6  . Ascorbic Acid (VITAMIN C PO) Take 1 tablet by mouth daily.    . celecoxib (CELEBREX) 200 MG capsule Take 1 capsule (200 mg total) by mouth daily. Use as needed for knee pain 90 capsule 3  . CRANBERRY PO Take 1 tablet by mouth daily.    . TURMERIC PO Take 1 tablet by mouth daily.     No current facility-administered medications on file prior to visit.     Review of Systems:  As per HPI- otherwise negative.   Physical Examination: There were no vitals filed for this visit. There were no vitals filed for this visit. There is no height or weight on file to calculate BMI. Ideal Body Weight:    GEN: WDWN, NAD, Non-toxic, A & O x 3 HEENT: Atraumatic, Normocephalic. Neck supple. No masses, No LAD. Ears and Nose: No external deformity. CV: RRR, No M/G/R. No JVD. No thrill. No extra heart sounds. PULM: CTA B, no wheezes, crackles, rhonchi. No retractions. No resp. distress. No  accessory muscle use. ABD: S, NT, ND, +BS. No rebound. No HSM. EXTR: No c/c/e NEURO Normal gait.  PSYCH: Normally interactive. Conversant. Not depressed or anxious appearing.  Calm demeanor.    Assessment and Plan: ***  Signed Lamar Blinks, MD

## 2018-05-16 NOTE — Progress Notes (Signed)
[place] at [facility] [place], [place], [place] [address] [phone]  Date:  05/22/2018   Name:  Lauren Mcclure   DOB:  03-May-1970   MRN:  [id number]  PCP:  Darreld Mclean, MD    Chief Complaint: Knee Pain (1-2 month follow up, refill on celebrex) and Hemorrhoids (possible hemorrhoids, pain in rectum, uncomfortable, no blood in stool)   History of Present Illness:  Lauren Mcclure is a 48 y.o. very pleasant female patient who presents with the following:  Here today for short-term blood pressure follow-up I last saw her in December, at which point she was also dealing with a knee injury She plays a lot of tennis, and her knee problems were in the way of her activity At last visit her blood pressure was high on 7-1/2 mg amlodipine, and we increased her to 10 mg.    She does check her BP at home - it tends to run 120s/80-90.   She notes that rarely her BP is higher like it is today  She saw ortho about her knee- they did take some fluid off her knee, her knee is doing generally okay, she is able to play tennis as she likes to  She notes a rectal/ bowel concern On a couple of recent occasions, she awoke at night with a feeling that she needed to have a stool, but there was nothing there.  However when she went to the toilet she noticed a lot of pain in the anal area She did not feel a bulge at her anus, no bleeding No abd cramping She is still moving her bowels normally   This has occurred twice - once in November and more recently She also had this issue some last year Can be triggered by spicy foods but not always  She has not yet had a colonoscopy  Her father had pancreatic cancer, but we are not aware of any family history of colon cancer  Wt Readings from Last 3 Encounters:  05/22/18 174 lb (78.9 kg)  03/16/18 166 lb (75.3 kg)  05/09/17 168 lb 3.2 oz (76.3 kg)   She has gained just a few pounds  BP  Readings from Last 3 Encounters:  05/22/18 (!) 144/90  03/16/18 (!) 150/100  05/09/17 (!) 144/100    Patient Active Problem List   Diagnosis Date Noted  . Elevated blood pressure reading   . Borderline blood pressure 02/19/2016    Past Medical History:  Diagnosis Date  . Asthma    as a teenager  . Elevated blood pressure reading   . Headache   . History of hyperlipidemia   . Kidney stones   . 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 LITHOTRIPSY    . NASAL SEPTUM SURGERY  2007  . TONSILLECTOMY      Social History   Tobacco Use  . Smoking status: Never Smoker  . Smokeless tobacco: Never Used  Substance Use Topics  . Alcohol use: Yes    Comment: Occ wine or beer  . Drug use: No    Family History  Problem Relation Age of Onset  . Hypertension Mother   . Hyperlipidemia Mother   . Pancreatic cancer Father        deceased age 82  . Cancer - Other Paternal Grandmother   .  Skin cancer Paternal Grandfather   . Heart disease Paternal Grandfather   . Hyperlipidemia Maternal Aunt     Allergies  Allergen Reactions  . Rocephin [Ceftriaxone Sodium In Dextrose] Rash    Medication list has been reviewed and updated.  Current Outpatient Medications on File Prior to Visit  Medication Sig Dispense Refill  . amLODipine (NORVASC) 10 MG tablet Take 1 tablet (10 mg total) by mouth daily. 30 tablet 6  . Ascorbic Acid (VITAMIN C PO) Take 1 tablet by mouth daily.    . Calcium Carbonate-Vit D-Min (CALCIUM 1200 PO) Take by mouth.    . celecoxib (CELEBREX) 200 MG capsule Take 1 capsule (200 mg total) by mouth daily. Use as needed for knee pain 90 capsule 3  . CRANBERRY PO Take 1 tablet by mouth daily.    . Multiple Vitamin (MULTIVITAMIN) tablet Take 1 tablet by mouth daily.    . TURMERIC PO Take 1 tablet by mouth daily.     No current facility-administered medications on  file prior to visit.     Review of Systems:  As per HPI- otherwise negative. No fever or chills, no chest pain or shortness of breath  Physical Examination: Vitals:   05/22/18 1424  BP: (!) 144/90  Pulse: 81  Resp: 16  Temp: 97.9 F (36.6 C)  SpO2: 98%   Vitals:   05/22/18 1424  Weight: 174 lb (78.9 kg)  Height: 5\' 6"  (1.676 m)   Body mass index is 28.08 kg/m. Ideal Body Weight: Weight in (lb) to have BMI = 25: 154.6  GEN: WDWN, NAD, Non-toxic, A & O x 3, overweight, looks well HEENT: Atraumatic, Normocephalic. Neck supple. No masses, No LAD. Ears and Nose: No external deformity. CV: RRR, No M/G/R. No JVD. No thrill. No extra heart sounds. PULM: CTA B, no wheezes, crackles, rhonchi. No retractions. No resp. distress. No accessory muscle use. ABD: S, NT, ND EXTR: No c/c/e NEURO Normal gait.  PSYCH: Normally interactive. Conversant. Not depressed or anxious appearing.  Calm demeanor.  No evidence of a perirectal abscess or any skin lesion.  No visible hemorrhoids.  She does have tenderness however on DRE suggestive of an anal fissure   Assessment and Plan: Rectal pain - Plan: Ambulatory referral to Gastroenterology, lidocaine (XYLOCAINE) 5 % ointment  Chronic pain of both knees - Plan: celecoxib (CELEBREX) 200 MG capsule  Immunization due - Plan: Flu Vaccine QUAD 6+ mos PF IM (Fluarix Quad PF)  Essential hypertension  Here today to follow-up in a couple of issues Her knee has been evaluated orthopedics, it is not perfect but she is able to do her activities.  She requests a refill of Celebrex Blood pressure is borderline here today, but she reports it has been well controlled at home.  She will continue to monitor this Flu shot given Somewhat unusual rectal pain, seems most consistent with an anal fissure.  However she is 48 years old and will be due for a screening colonoscopy in short order.  I gave her a topical lidocaine ointment to use as needed, and will refer  to GI for further evaluation  Signed Lamar Blinks, MD

## 2018-05-17 ENCOUNTER — Ambulatory Visit: Admitting: Family Medicine

## 2018-05-22 ENCOUNTER — Encounter: Payer: Self-pay | Admitting: Family Medicine

## 2018-05-22 ENCOUNTER — Ambulatory Visit (INDEPENDENT_AMBULATORY_CARE_PROVIDER_SITE_OTHER): Admitting: Family Medicine

## 2018-05-22 VITALS — BP 140/90 | HR 81 | Temp 97.9°F | Resp 16 | Ht 66.0 in | Wt 174.0 lb

## 2018-05-22 DIAGNOSIS — M25562 Pain in left knee: Secondary | ICD-10-CM

## 2018-05-22 DIAGNOSIS — K6289 Other specified diseases of anus and rectum: Secondary | ICD-10-CM | POA: Diagnosis not present

## 2018-05-22 DIAGNOSIS — Z23 Encounter for immunization: Secondary | ICD-10-CM

## 2018-05-22 DIAGNOSIS — M25561 Pain in right knee: Secondary | ICD-10-CM | POA: Diagnosis not present

## 2018-05-22 DIAGNOSIS — I10 Essential (primary) hypertension: Secondary | ICD-10-CM | POA: Diagnosis not present

## 2018-05-22 DIAGNOSIS — G8929 Other chronic pain: Secondary | ICD-10-CM

## 2018-05-22 MED ORDER — LIDOCAINE 5 % EX OINT: 1.0000 "application " | TOPICAL_OINTMENT | CUTANEOUS | 0 refills | Status: DC | PRN

## 2018-05-22 MED ORDER — CELECOXIB 200 MG PO CAPS: 200.0000 mg | ORAL_CAPSULE | Freq: Every day | ORAL | 3 refills | Status: DC

## 2018-05-22 NOTE — Patient Instructions (Signed)
You got your flu shot today I am going to refer you to GI; it is about time for a screening colonoscopy, and they can help [place] figure out your anal pain as well  In the meantime I gave you some lidocaine that you can apply topically to the anal area as needed for pain- this is for occasional use  Continue to monitor your BP; if it is running higher than 140/90 on a regular basis please let me know

## 2018-10-24 NOTE — Progress Notes (Signed)
[place] at [facility] [address] [place], [place] [address] [phone]  Date:  10/25/2018   Name:  Lauren Mcclure   DOB:  09/28/1970   MRN:  [id number]  PCP:  Darreld Mclean, MD    Chief Complaint: No chief complaint on file.   History of Present Illness:  Lauren Mcclure is a 48 y.o. very pleasant female patient who presents with the following:  Pt with history of borderline BP, kidney stones Virtual visit today for concern of abdominal pain Pt location is home, provider location is office  Today she has concern of abd pain She has noted discomfort in her right lower abdomen since Saturday- she is using excedrin for this on a daily basis.  This does help control her pain, but it then returns   Always hurts in the same spot May be getting a bit worse Feel sharp  Seems to radiate to the groin No vomiting Her appetite is reduced x 48 hours No pain with walking or riding in a car No constipation No ever noted but she is taking fever reducers as above  No urianry sx or blood in her urine  No history of abd operation LMP was the first week of June - per pt she does not suspect pregnancy   Patient Active Problem List   Diagnosis Date Noted  . Elevated blood pressure reading   . Borderline blood pressure 02/19/2016    Past Medical History:  Diagnosis Date  . Asthma    as a teenager  . Elevated blood pressure reading   . Headache   . History of hyperlipidemia   . Kidney stones   . 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 LITHOTRIPSY    . NASAL SEPTUM SURGERY  2007  . TONSILLECTOMY      Social History   Tobacco Use  . Smoking status: Never Smoker  . Smokeless tobacco: Never Used  Substance Use Topics  . Alcohol use: Yes    Comment: Occ wine or beer  . Drug use: No    Family History   Problem Relation Age of Onset  . Hypertension Mother   . Hyperlipidemia Mother   . Pancreatic cancer Father        deceased age 27  . Cancer - Other Paternal Grandmother   . Skin cancer Paternal Grandfather   . Heart disease Paternal Grandfather   . Hyperlipidemia Maternal Aunt     Allergies  Allergen Reactions  . Rocephin [Ceftriaxone Sodium In Dextrose] Rash    Medication list has been reviewed and updated.  Current Outpatient Medications on File Prior to Visit  Medication Sig Dispense Refill  . amLODipine (NORVASC) 10 MG tablet Take 1 tablet (10 mg total) by mouth daily. 30 tablet 6  . Ascorbic Acid (VITAMIN C PO) Take 1 tablet by mouth daily.    . Calcium Carbonate-Vit D-Min (CALCIUM 1200 PO) Take by mouth.    . celecoxib (CELEBREX) 200 MG capsule Take 1 capsule (200 mg total) by mouth daily. Use as needed for knee pain 90 capsule 3  . CRANBERRY PO Take 1 tablet by mouth daily.    Marland Kitchen lidocaine (XYLOCAINE) 5 % ointment Apply 1 application topically as needed. 35.44 g 0  . Multiple Vitamin (MULTIVITAMIN) tablet Take 1 tablet by mouth daily.    Marland Kitchen  TURMERIC PO Take 1 tablet by mouth daily.     No current facility-administered medications on file prior to visit.     Review of Systems:  As per HPI- otherwise negative.   Physical Examination: There were no vitals filed for this visit. There were no vitals filed for this visit. There is no height or weight on file to calculate BMI. Ideal Body Weight:    Pt viewed over video-she looks well  Assessment and Plan:   ICD-10-CM   1. Right lower quadrant abdominal pain  R10.31    Virtual visit today for RLQ pain concerning for appendicitis or other acute issue.  Advised pt to come to the ER here at the med center right away- she lives just a couple of miles away.  She agrees to do   Follow-up: No follow-ups on file.  No orders of the defined types were placed in this encounter.  No orders of the defined types were placed  in this encounter.   @SIGN @  Outpatient Encounter Medications as of 10/25/2018  Medication Sig  . amLODipine (NORVASC) 10 MG tablet Take 1 tablet (10 mg total) by mouth daily.  . Ascorbic Acid (VITAMIN C PO) Take 1 tablet by mouth daily.  . Calcium Carbonate-Vit D-Min (CALCIUM 1200 PO) Take by mouth.  . celecoxib (CELEBREX) 200 MG capsule Take 1 capsule (200 mg total) by mouth daily. Use as needed for knee pain  . CRANBERRY PO Take 1 tablet by mouth daily.  Marland Kitchen lidocaine (XYLOCAINE) 5 % ointment Apply 1 application topically as needed.  . Multiple Vitamin (MULTIVITAMIN) tablet Take 1 tablet by mouth daily.  . TURMERIC PO Take 1 tablet by mouth daily.   No facility-administered encounter medications on file as of 10/25/2018.     Lamar Blinks, MD  Signed Lamar Blinks, MD

## 2018-10-25 ENCOUNTER — Encounter (HOSPITAL_BASED_OUTPATIENT_CLINIC_OR_DEPARTMENT_OTHER): Payer: Self-pay

## 2018-10-25 ENCOUNTER — Emergency Department (HOSPITAL_BASED_OUTPATIENT_CLINIC_OR_DEPARTMENT_OTHER)
Admission: EM | Admit: 2018-10-25 | Discharge: 2018-10-25 | Disposition: A | Discharge status: Home / Self Care | Attending: Emergency Medicine | Admitting: Emergency Medicine

## 2018-10-25 ENCOUNTER — Emergency Department (HOSPITAL_BASED_OUTPATIENT_CLINIC_OR_DEPARTMENT_OTHER)

## 2018-10-25 ENCOUNTER — Other Ambulatory Visit: Payer: Self-pay

## 2018-10-25 ENCOUNTER — Ambulatory Visit (INDEPENDENT_AMBULATORY_CARE_PROVIDER_SITE_OTHER): Admitting: Family Medicine

## 2018-10-25 ENCOUNTER — Encounter: Payer: Self-pay | Admitting: Family Medicine

## 2018-10-25 DIAGNOSIS — R1031 Right lower quadrant pain: Secondary | ICD-10-CM | POA: Insufficient documentation

## 2018-10-25 DIAGNOSIS — D259 Leiomyoma of uterus, unspecified: Secondary | ICD-10-CM | POA: Insufficient documentation

## 2018-10-25 DIAGNOSIS — R110 Nausea: Secondary | ICD-10-CM | POA: Diagnosis not present

## 2018-10-25 DIAGNOSIS — J45909 Unspecified asthma, uncomplicated: Secondary | ICD-10-CM | POA: Insufficient documentation

## 2018-10-25 DIAGNOSIS — R630 Anorexia: Secondary | ICD-10-CM | POA: Insufficient documentation

## 2018-10-25 LAB — URINALYSIS, ROUTINE W REFLEX MICROSCOPIC
Bilirubin Urine: NEGATIVE
Glucose, UA: NEGATIVE mg/dL
Hgb urine dipstick: NEGATIVE
Ketones, ur: NEGATIVE mg/dL
Leukocytes,Ua: NEGATIVE
Nitrite: NEGATIVE
Protein, ur: NEGATIVE mg/dL
Specific Gravity, Urine: >1.03 — ABNORMAL HIGH (ref 1.005–1.030)
pH: 5.5 (ref 5.0–8.0)

## 2018-10-25 LAB — CBC WITH DIFFERENTIAL/PLATELET
Abs Immature Granulocytes: 0.04 10*3/uL (ref 0.00–0.07)
Basophils Absolute: 0.1 10*3/uL (ref 0.0–0.1)
Basophils Relative: 1 %
Eosinophils Absolute: 0.1 10*3/uL (ref 0.0–0.5)
Eosinophils Relative: 1 %
HCT: 41 % (ref 36.0–46.0)
Hemoglobin: 13.2 g/dL (ref 12.0–15.0)
Immature Granulocytes: 1 %
Lymphocytes Relative: 23 %
Lymphs Abs: 1.7 10*3/uL (ref 0.7–4.0)
MCH: 25.2 pg — ABNORMAL LOW (ref 26.0–34.0)
MCHC: 32.2 g/dL (ref 30.0–36.0)
MCV: 78.4 fL — ABNORMAL LOW (ref 80.0–100.0)
Monocytes Absolute: 0.8 10*3/uL (ref 0.1–1.0)
Monocytes Relative: 11 %
Neutro Abs: 4.6 10*3/uL (ref 1.7–7.7)
Neutrophils Relative %: 63 %
Platelets: 297 10*3/uL (ref 150–400)
RBC: 5.23 MIL/uL — ABNORMAL HIGH (ref 3.87–5.11)
RDW: 14.4 % (ref 11.5–15.5)
WBC: 7.2 10*3/uL (ref 4.0–10.5)
nRBC: 0 % (ref 0.0–0.2)

## 2018-10-25 LAB — PREGNANCY, URINE: Preg Test, Ur: NEGATIVE

## 2018-10-25 LAB — COMPREHENSIVE METABOLIC PANEL
ALT: 39 U/L (ref 0–44)
AST: 35 U/L (ref 15–41)
Albumin: 4.1 g/dL (ref 3.5–5.0)
Alkaline Phosphatase: 67 U/L (ref 38–126)
Anion gap: 9 (ref 5–15)
BUN: 11 mg/dL (ref 6–20)
CO2: 24 mmol/L (ref 22–32)
Calcium: 9 mg/dL (ref 8.9–10.3)
Chloride: 104 mmol/L (ref 98–111)
Creatinine, Ser: 0.81 mg/dL (ref 0.44–1.00)
GFR calc Af Amer: >60 mL/min (ref >60)
GFR calc non Af Amer: >60 mL/min (ref >60)
Glucose, Bld: 105 mg/dL — ABNORMAL HIGH (ref 70–99)
Potassium: 3.5 mmol/L (ref 3.5–5.1)
Sodium: 137 mmol/L (ref 135–145)
Total Bilirubin: 1.5 mg/dL — ABNORMAL HIGH (ref 0.3–1.2)
Total Protein: 7.3 g/dL (ref 6.5–8.1)

## 2018-10-25 LAB — LIPASE, BLOOD: Lipase: 31 U/L (ref 11–51)

## 2018-10-25 MED ORDER — NAPROXEN 500 MG PO TABS: 500.0000 mg | ORAL_TABLET | Freq: Two times a day (BID) | ORAL | 0 refills | Status: DC

## 2018-10-25 MED ORDER — IOHEXOL 300 MG/ML  SOLN
100.0000 mL | Freq: Once | INTRAMUSCULAR | Status: AC | PRN
  Administered 2018-10-25: 100 mL via INTRAVENOUS

## 2018-10-25 NOTE — ED Notes (Signed)
Patient transported to CT 

## 2018-10-25 NOTE — ED Provider Notes (Signed)
[facility] Emergency Department Provider Note MRN:  [id number]  Arrival date & time: 10/25/18     Chief Complaint   Abdominal Pain   History of Present Illness   Lauren Mcclure is a 48 y.o. year-old female with no pertinent past medical history presenting to the ED with chief complaint of abdominal pain.  Pain is located in the right lower quadrant, present for 3 to 4 days, associated with mild nausea, poor appetite.  Denies vomiting, denies fever, no chest pain or shortness of breath, no dysuria, no hematuria, no vaginal bleeding or discharge.  Sent here by PCP for evaluation for appendicitis.  Symptoms constant, moderate in severity, no exacerbating relieving factors.  Review of Systems  A complete 10 system review of systems was obtained and all systems are negative except as noted in the HPI and PMH.   Patient's Health History    Past Medical History:  Diagnosis Date  . Asthma    as a teenager  . Elevated blood pressure reading   . Headache   . History of hyperlipidemia   . Kidney stones   . 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 LITHOTRIPSY    . NASAL SEPTUM SURGERY  2007  . TONSILLECTOMY      Family History  Problem Relation Age of Onset  . Hypertension Mother   . Hyperlipidemia Mother   . Pancreatic cancer Father        deceased age 17  . Cancer - Other Paternal Grandmother   . Skin cancer Paternal Grandfather   . Heart disease Paternal Grandfather   . Hyperlipidemia Maternal Aunt     Social History   Socioeconomic History  . Marital status: Married    Spouse name: Not on file  . Number of children: Not on file  . Years of education: Not on file  . Highest education level: Not on file  Occupational History  . Not on file  Social Needs  . Financial resource strain: Not on file  . Food insecurity    Worry: Not on  file    Inability: Not on file  . Transportation needs    Medical: Not on file    Non-medical: Not on file  Tobacco Use  . Smoking status: Never Smoker  . Smokeless tobacco: Never Used  Substance and Sexual Activity  . Alcohol use: Yes    Comment: Occ wine or beer  . Drug use: No  . Sexual activity: Not on file  Lifestyle  . Physical activity    Days per week: Not on file    Minutes per session: Not on file  . Stress: Not on file  Relationships  . Social Herbalist on phone: Not on file    Gets together: Not on file    Attends religious service: Not on file    Active member of club or organization: Not on file    Attends meetings of clubs or organizations: Not on file    Relationship status: Not on file  . Intimate partner violence    Fear of current or ex partner: Not on file    Emotionally abused: Not on file    Physically abused: Not on file    Forced sexual activity: Not on file  Other Topics Concern  . Not on file  Social History Narrative  . Not  on file     Physical Exam  Vital Signs and Nursing Notes reviewed Vitals:   10/25/18 1134  BP: (!) 141/92  Pulse: 73  Resp: 20  Temp: 98.5 F (36.9 C)  SpO2: 99%    CONSTITUTIONAL: Well-appearing, NAD NEURO:  Alert and oriented x 3, no focal deficits EYES:  eyes equal and reactive ENT/NECK:  no LAD, no JVD CARDIO: Regular rate, well-perfused, normal S1 and S2 PULM:  CTAB no wheezing or rhonchi GI/GU:  normal bowel sounds, non-distended, non-tender MSK/SPINE:  No gross deformities, no edema SKIN:  no rash, atraumatic PSYCH:  Appropriate speech and behavior  Diagnostic and Interventional Summary    Labs Reviewed  URINALYSIS, ROUTINE W REFLEX MICROSCOPIC - Abnormal; Notable for the following components:      Result Value   Specific Gravity, Urine >1.030 (*)    All other components within normal limits  CBC WITH DIFFERENTIAL/PLATELET - Abnormal; Notable for the following components:   RBC 5.23  (*)    MCV 78.4 (*)    MCH 25.2 (*)    All other components within normal limits  COMPREHENSIVE METABOLIC PANEL - Abnormal; Notable for the following components:   Glucose, Bld 105 (*)    Total Bilirubin 1.5 (*)    All other components within normal limits  LIPASE, BLOOD  PREGNANCY, URINE    CT ABDOMEN PELVIS W CONTRAST  Final Result      Medications  iohexol (OMNIPAQUE) 300 MG/ML solution 100 mL (100 mLs Intravenous Contrast Given 10/25/18 1224)     Procedures Critical Care  ED Course and Medical Decision Making  I have reviewed the triage vital signs and the nursing notes.  Pertinent labs & imaging results that were available during my care of the patient were reviewed by me and considered in my medical decision making (see below for details).  CT to exclude appendicitis in this 48 year old female otherwise healthy here with right lower quadrant pain.  No evidence of appendicitis on the CT, pain possibly explained by uterine fibroids.  Work-up otherwise unrevealing, patient is appropriate for discharge with anti-inflammatories and PCP follow-up.  After the discussed management above, the patient was determined to be safe for discharge.  The patient was in agreement with this plan and all questions regarding their care were answered.  ED return precautions were discussed and the patient will return to the ED with any significant worsening of condition.  Barth Kirks. Sedonia Small, [place] [email or web address]  Final Clinical Impressions(s) / ED Diagnoses     ICD-10-CM   1. Right lower quadrant abdominal pain  R10.31   2. Uterine leiomyoma, unspecified location  D25.9     ED Discharge Orders         Ordered    naproxen (NAPROSYN) 500 MG tablet  2 times daily     10/25/18 1332             Maudie Flakes, MD 10/25/18 1338

## 2018-10-25 NOTE — ED Triage Notes (Signed)
C/o RLQ pain x 4 days-sent from PCP after virtual exam-NAD-steady gait

## 2018-10-25 NOTE — Discharge Instructions (Addendum)
You were evaluated in the Emergency Department and after careful evaluation, we did not find any emergent condition requiring admission or further testing in the hospital.  Your symptoms today may be related to uterine fibroids.  Your CT scan was otherwise reassuring and did not show any signs of appendicitis.  We recommend follow-up with a GYN doctor for further management considerations.  You can use the anti-inflammatory medication provided as needed for pain  Please return to the Emergency Department if you experience any worsening of your condition.  We encourage you to follow up with a primary care provider.  Thank you for allowing [place] to be a part of your care.

## 2018-11-10 ENCOUNTER — Other Ambulatory Visit: Payer: Self-pay | Admitting: Family Medicine

## 2018-11-10 DIAGNOSIS — I10 Essential (primary) hypertension: Secondary | ICD-10-CM

## 2018-11-24 ENCOUNTER — Encounter: Payer: Self-pay | Admitting: Family Medicine

## 2019-05-15 ENCOUNTER — Telehealth: Payer: Self-pay

## 2019-05-15 NOTE — Telephone Encounter (Signed)
PA initiated via Covermymeds; KEY: [id number]. PA approved.   [id number];Review Type:Prior Auth;Coverage Start Date:04/15/2019;Coverage End Date:05/14/2020;

## 2019-05-16 ENCOUNTER — Telehealth: Payer: Self-pay | Admitting: Family Medicine

## 2019-05-16 DIAGNOSIS — G8929 Other chronic pain: Secondary | ICD-10-CM

## 2019-05-16 DIAGNOSIS — I10 Essential (primary) hypertension: Secondary | ICD-10-CM

## 2019-05-16 DIAGNOSIS — M25562 Pain in left knee: Secondary | ICD-10-CM

## 2019-05-16 MED ORDER — AMLODIPINE BESYLATE 10 MG PO TABS: ORAL_TABLET | ORAL | 0 refills | Status: DC

## 2019-05-16 MED ORDER — CELECOXIB 200 MG PO CAPS
200.0000 mg | ORAL_CAPSULE | Freq: Every day | ORAL | 0 refills | Status: DC
Start: 2019-05-16 — End: 2019-10-18

## 2019-05-16 NOTE — Telephone Encounter (Signed)
Refilled for 30 day supply until appt/

## 2019-05-16 NOTE — Telephone Encounter (Signed)
Caller Name: maleeah, hoefer  Phone: [phone]  Medication: amlodipine - 3 days left & celecoxib - 3 pills left Next appt scheduled for 05/28/2019   Has the patient contacted their pharmacy? Yes - was told to call her doctor  Preferred Pharmacy (with phone number or street name):  [facility] [id number] - [place], [place] - [address] AT [place] Phone:  [phone]  Fax:  [phone]

## 2019-05-26 DIAGNOSIS — I10 Essential (primary) hypertension: Secondary | ICD-10-CM | POA: Insufficient documentation

## 2019-05-26 NOTE — Patient Instructions (Addendum)
It is great to see you again today, I will be in touch with your labs as soon as possible We will order Cologuard for you to screen for colon cancer We will also set you up for a mammogram  We froze the skin lesion on your back today- this should hopefully dry up and peel off.  If it does not I can free again in a month or so  It does sound like you are going through menopause- if you have bleeding again after NO bleeding for a year please let me know    Health Maintenance, Female Adopting a healthy lifestyle and getting preventive care are important in promoting health and wellness. Ask your health care provider about:  The right schedule for you to have regular tests and exams.  Things you can do on your own to prevent diseases and keep yourself healthy. What should I know about diet, weight, and exercise? Eat a healthy diet   Eat a diet that includes plenty of vegetables, fruits, low-fat dairy products, and lean protein.  Do not eat a lot of foods that are high in solid fats, added sugars, or sodium. Maintain a healthy weight Body mass index (BMI) is used to identify weight problems. It estimates body fat based on height and weight. Your health care provider can help determine your BMI and help you achieve or maintain a healthy weight. Get regular exercise Get regular exercise. This is one of the most important things you can do for your health. Most adults should:  Exercise for at least 150 minutes each week. The exercise should increase your heart rate and make you sweat (moderate-intensity exercise).  Do strengthening exercises at least twice a week. This is in addition to the moderate-intensity exercise.  Spend less time sitting. Even light physical activity can be beneficial. Watch cholesterol and blood lipids Have your blood tested for lipids and cholesterol at 50 years of age, then have this test every 5 years. Have your cholesterol levels checked more often if:  Your  lipid or cholesterol levels are high.  You are older than 50 years of age.  You are at high risk for heart disease. What should I know about cancer screening? Depending on your health history and family history, you may need to have cancer screening at various ages. This may include screening for:  Breast cancer.  Cervical cancer.  Colorectal cancer.  Skin cancer.  Lung cancer. What should I know about heart disease, diabetes, and high blood pressure? Blood pressure and heart disease  High blood pressure causes heart disease and increases the risk of stroke. This is more likely to develop in people who have high blood pressure readings, are of African descent, or are overweight.  Have your blood pressure checked: ? Every 3-5 years if you are 74-88 years of age. ? Every year if you are 14 years old or older. Diabetes Have regular diabetes screenings. This checks your fasting blood sugar level. Have the screening done:  Once every three years after age 34 if you are at a normal weight and have a low risk for diabetes.  More often and at a younger age if you are overweight or have a high risk for diabetes. What should I know about preventing infection? Hepatitis B If you have a higher risk for hepatitis B, you should be screened for this virus. Talk with your health care provider to find out if you are at risk for hepatitis B infection. Hepatitis C  Testing is recommended for:  Everyone born from [birth year] through 1966.  Anyone with known risk factors for hepatitis C. Sexually transmitted infections (STIs)  Get screened for STIs, including gonorrhea and chlamydia, if: ? You are sexually active and are younger than 50 years of age. ? You are older than 50 years of age and your health care provider tells you that you are at risk for this type of infection. ? Your sexual activity has changed since you were last screened, and you are at increased risk for chlamydia or gonorrhea. Ask  your health care provider if you are at risk.  Ask your health care provider about whether you are at high risk for HIV. Your health care provider may recommend a prescription medicine to help prevent HIV infection. If you choose to take medicine to prevent HIV, you should first get tested for HIV. You should then be tested every 3 months for as long as you are taking the medicine. Pregnancy  If you are about to stop having your period (premenopausal) and you may become pregnant, seek counseling before you get pregnant.  Take 400 to 800 micrograms (mcg) of folic acid every day if you become pregnant.  Ask for birth control (contraception) if you want to prevent pregnancy. Osteoporosis and menopause Osteoporosis is a disease in which the bones lose minerals and strength with aging. This can result in bone fractures. If you are 57 years old or older, or if you are at risk for osteoporosis and fractures, ask your health care provider if you should:  Be screened for bone loss.  Take a calcium or vitamin D supplement to lower your risk of fractures.  Be given hormone replacement therapy (HRT) to treat symptoms of menopause. Follow these instructions at home: Lifestyle  Do not use any products that contain nicotine or tobacco, such as cigarettes, e-cigarettes, and chewing tobacco. If you need help quitting, ask your health care provider.  Do not use street drugs.  Do not share needles.  Ask your health care provider for help if you need support or information about quitting drugs. Alcohol use  Do not drink alcohol if: ? Your health care provider tells you not to drink. ? You are pregnant, may be pregnant, or are planning to become pregnant.  If you drink alcohol: ? Limit how much you use to 0-1 drink a day. ? Limit intake if you are breastfeeding.  Be aware of how much alcohol is in your drink. In the [place] one drink equals one 12 oz bottle of beer (355 mL), one 5 oz glass of wine  (148 mL), or one 1 oz glass of hard liquor (44 mL). General instructions  Schedule regular health, dental, and eye exams.  Stay current with your vaccines.  Tell your health care provider if: ? You often feel depressed. ? You have ever been abused or do not feel safe at home. Summary  Adopting a healthy lifestyle and getting preventive care are important in promoting health and wellness.  Follow your health care provider's instructions about healthy diet, exercising, and getting tested or screened for diseases.  Follow your health care provider's instructions on monitoring your cholesterol and blood pressure. This information is not intended to replace advice given to you by your health care provider. Make sure you discuss any questions you have with your health care provider. Document Revised: 03/15/2018 Document Reviewed: 03/15/2018 Elsevier Patient Education  2020 [employer].

## 2019-05-26 NOTE — Progress Notes (Addendum)
[place] at [facility] [address], [place], [place] [address] [phone] [phone]  Date:  05/28/2019   Name:  Lauren Mcclure   DOB:  08-08-1970   MRN:  [id number]  PCP:  Darreld Mclean, MD    Chief Complaint: Annual Exam (flu shot not done) and Pap Smear (declines pap smear)   History of Present Illness:  Lauren Mcclure is a 49 y.o. very pleasant female patient who presents with the following:  Patient with history of hypertension, here today for complete physical  She recently started working for [employer] and loves her new job, she is proud to say she has been promoted 3 times already.  Offered our congratulations  Last seen by myself in July with abdominal pain; at that time she was referred to the emergency department for further evaluation and rule out appendicitis CT scan at that time was negative She is not having any more abd pain  Pap smear- done in 2019, it was normal Her last MP was about 8 months  Mammogram- will order for her today  Colon cancer screening- no family history of colon cancer, order cologuard  Routine labs- will do today, also TSH  Flu vaccine- give today Tetanus vaccine Can suggest Shingrix Most recent labs done in July, CMP and CBC  Amlodipine for her BP -stable, refill today  BP Readings from Last 3 Encounters:  05/28/19 122/82  10/25/18 135/89  05/22/18 140/90   She notes that she has gained some weight which is frustrating to her- she did lose weight down to about 154 she thinks in June.  However looking back her weight has been relatively stable over the longer term.  She is menopausal, and we discussed that this often will lead to weight gain if we are not vigilant  Wt Readings from Last 3 Encounters:  05/28/19 172 lb (78 kg)  10/25/18 167 lb (75.8 kg)  05/22/18 174 lb (78.9 kg)   She is taking celebrex once in a while for knee pain  She notes difficulty with early awakening- she will  wake up too early and has difficulty getting to sleep She has noted this for 3 months or so- may be related to working 2nd shift for a while.  She has now been hired on full-time, and works first shift.  However she notes persistent difficulty with sleeping even with normal hours She has tried Benadryl, this helped somewhat  She also notes a large skin lesion on her left lower back.  We actually did a biopsy of this lesion in 2017, it was a seborrheic keratosis.  I offered cryotherapy of this lesion today-she would like to proceed  Patient Active Problem List   Diagnosis Date Noted  . Essential hypertension 05/26/2019    Past Medical History:  Diagnosis Date  . Asthma    as a teenager  . Elevated blood pressure reading   . Headache   . History of hyperlipidemia   . Kidney stones   . 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 LITHOTRIPSY    . NASAL SEPTUM SURGERY  2007  . TONSILLECTOMY      Social History   Tobacco Use  . Smoking status: Never Smoker  . Smokeless tobacco: Never Used  Substance Use Topics  . Alcohol use: Yes    Comment: Occ wine  or beer  . Drug use: No    Family History  Problem Relation Age of Onset  . Hypertension Mother   . Hyperlipidemia Mother   . Pancreatic cancer Father        deceased age 50  . Cancer - Other Paternal Grandmother   . Skin cancer Paternal Grandfather   . Heart disease Paternal Grandfather   . Hyperlipidemia Maternal Aunt     Allergies  Allergen Reactions  . Rocephin [Ceftriaxone Sodium In Dextrose] Rash    Medication list has been reviewed and updated.  Current Outpatient Medications on File Prior to Visit  Medication Sig Dispense Refill  . amLODipine (NORVASC) 10 MG tablet TAKE 1 TABLET(10 MG) BY MOUTH DAILY 30 tablet 0  . Ascorbic Acid (VITAMIN C PO) Take 1 tablet by mouth daily.    . Calcium Carbonate-Vit  D-Min (CALCIUM 1200 PO) Take by mouth.    . CRANBERRY PO Take 1 tablet by mouth daily.    . Multiple Vitamin (MULTIVITAMIN) tablet Take 1 tablet by mouth daily.    . TURMERIC PO Take 1 tablet by mouth daily.    . celecoxib (CELEBREX) 200 MG capsule Take 1 capsule (200 mg total) by mouth daily. Use as needed for knee pain (Patient not taking: Reported on 05/28/2019) 30 capsule 0   No current facility-administered medications on file prior to visit.    Review of Systems:  As per HPI- otherwise negative.   Physical Examination: Vitals:   05/28/19 1444  BP: 122/82  Pulse: 87  Resp: 16  Temp: (!) 96.8 F (36 C)  SpO2: 99%   Vitals:   05/28/19 1444  Weight: 172 lb (78 kg)  Height: 5\' 6"  (1.676 m)   Body mass index is 27.76 kg/m. Ideal Body Weight: Weight in (lb) to have BMI = 25: 154.6  GEN: no acute distress.  Overweight, looks well HEENT: Atraumatic, Normocephalic.   Bilateral TM wnl, oropharynx normal.  PEERL,EOMI.   Ears and Nose: No external deformity. CV: RRR, No M/G/R. No JVD. No thrill. No extra heart sounds. PULM: CTA B, no wheezes, crackles, rhonchi. No retractions. No resp. distress. No accessory muscle use. ABD: S, NT, ND, +BS. No rebound. No HSM. EXTR: No c/c/e PSYCH: Normally interactive. Conversant.  Skin lesion left lower back, hyperpigmented slightly smaller than a dime-consistent with seborrheic keratoses confirmed on previous biopsy  Verbal consent obtained.  Seborrheic keratosis on lower back treated with cryotherapy (liquid nitrogen) x3 cycles.  Patient tolerated well, no immediate complications.  No blood loss   Assessment and Plan: Physical exam  Essential hypertension - Plan: CBC, Comprehensive metabolic panel, amLODipine (NORVASC) 10 MG tablet  Screening for HIV (human immunodeficiency virus) - Plan: HIV Antibody (routine testing w rflx)  Screening for thyroid disorder - Plan: TSH  Screening for deficiency anemia - Plan: CBC  Screening for  diabetes mellitus - Plan: Comprehensive metabolic panel, Hemoglobin A1c  Screening for hyperlipidemia - Plan: Lipid panel  Encounter for screening mammogram for malignant neoplasm of breast - Plan: MM 3D SCREEN BREAST BILATERAL  Screening for cervical cancer  Immunization due - Plan: Flu Vaccine QUAD 6+ mos PF IM (Fluarix Quad PF)  Primary insomnia - Plan: traZODone (DESYREL) 50 MG tablet  Seborrheic keratosis  Here today for routine physical Labs pain as above, I will be in touch of these reports ASAP Order mammogram and Cologuard Flu shot today Discussed insomnia, will have her try trazodone.  She will let me know how this works  for her Counseled about definition of postmenopausal bleeding Cryotherapy for seborrheic keratosis as above  This visit occurred during the SARS-CoV-2 public health emergency.  Safety protocols were in place, including screening questions prior to the visit, additional usage of staff PPE, and extensive cleaning of exam room while observing appropriate contact time as indicated for disinfecting solutions.    Signed Lamar Blinks, MD  Received her labs 2/23- letter to pt  Results for orders placed or performed in visit on 05/28/19  CBC  Result Value Ref Range   WBC 10.8 (H) 4.0 - 10.5 K/uL   RBC 5.07 3.87 - 5.11 Mil/uL   Platelets 291.0 150.0 - 400.0 K/uL   Hemoglobin 14.2 12.0 - 15.0 g/dL   HCT 42.1 36.0 - 46.0 %   MCV 83.2 78.0 - 100.0 fl   MCHC 33.6 30.0 - 36.0 g/dL   RDW 14.5 11.5 - 15.5 %  Comprehensive metabolic panel  Result Value Ref Range   Sodium 139 135 - 145 mEq/L   Potassium 3.7 3.5 - 5.1 mEq/L   Chloride 103 96 - 112 mEq/L   CO2 27 19 - 32 mEq/L   Glucose, Bld 97 70 - 99 mg/dL   BUN 9 6 - 23 mg/dL   Creatinine, Ser 0.92 0.40 - 1.20 mg/dL   Total Bilirubin 1.0 0.2 - 1.2 mg/dL   Alkaline Phosphatase 93 39 - 117 U/L   AST 33 0 - 37 U/L   ALT 63 (H) 0 - 35 U/L   Total Protein 7.3 6.0 - 8.3 g/dL   Albumin 4.5 3.5 - 5.2 g/dL    GFR 64.60 >60.00 mL/min   Calcium 9.7 8.4 - 10.5 mg/dL  Hemoglobin A1c  Result Value Ref Range   Hgb A1c MFr Bld 6.4 4.6 - 6.5 %  Lipid panel  Result Value Ref Range   Cholesterol 253 (H) 0 - 200 mg/dL   Triglycerides (H) 0.0 - 149.0 mg/dL    590.0 Triglyceride is over 400; calculations on Lipids are invalid.   HDL 36.30 (L) >39.00 mg/dL   Total CHOL/HDL Ratio 7   TSH  Result Value Ref Range   TSH 1.27 0.35 - 4.50 uIU/mL  HIV Antibody (routine testing w rflx)  Result Value Ref Range   HIV 1&2 Ab, 4th Generation NON-REACTIVE NON-REACTI  LDL cholesterol, direct  Result Value Ref Range   Direct LDL 136.0 mg/dL    A1c and lipids both went up a good bit

## 2019-05-28 ENCOUNTER — Encounter: Payer: Self-pay | Admitting: Family Medicine

## 2019-05-28 ENCOUNTER — Other Ambulatory Visit: Payer: Self-pay

## 2019-05-28 ENCOUNTER — Ambulatory Visit (INDEPENDENT_AMBULATORY_CARE_PROVIDER_SITE_OTHER): Payer: BC Managed Care – PPO | Admitting: Family Medicine

## 2019-05-28 VITALS — BP 122/82 | HR 87 | Temp 96.8°F | Resp 16 | Ht 66.0 in | Wt 172.0 lb

## 2019-05-28 DIAGNOSIS — Z1231 Encounter for screening mammogram for malignant neoplasm of breast: Secondary | ICD-10-CM

## 2019-05-28 DIAGNOSIS — Z130 Encounter for screening for diseases of the blood and blood-forming organs and certain disorders involving the immune mechanism: Secondary | ICD-10-CM

## 2019-05-28 DIAGNOSIS — Z131 Encounter for screening for diabetes mellitus: Secondary | ICD-10-CM

## 2019-05-28 DIAGNOSIS — R7401 Elevation of levels of liver transaminase levels: Secondary | ICD-10-CM

## 2019-05-28 DIAGNOSIS — Z114 Encounter for screening for human immunodeficiency virus [HIV]: Secondary | ICD-10-CM

## 2019-05-28 DIAGNOSIS — Z0000 Encounter for general adult medical examination without abnormal findings: Secondary | ICD-10-CM | POA: Diagnosis not present

## 2019-05-28 DIAGNOSIS — I10 Essential (primary) hypertension: Secondary | ICD-10-CM

## 2019-05-28 DIAGNOSIS — Z13220 Encounter for screening for lipoid disorders: Secondary | ICD-10-CM

## 2019-05-28 DIAGNOSIS — Z124 Encounter for screening for malignant neoplasm of cervix: Secondary | ICD-10-CM

## 2019-05-28 DIAGNOSIS — Z23 Encounter for immunization: Secondary | ICD-10-CM

## 2019-05-28 DIAGNOSIS — Z1329 Encounter for screening for other suspected endocrine disorder: Secondary | ICD-10-CM | POA: Diagnosis not present

## 2019-05-28 DIAGNOSIS — L821 Other seborrheic keratosis: Secondary | ICD-10-CM

## 2019-05-28 DIAGNOSIS — F5101 Primary insomnia: Secondary | ICD-10-CM

## 2019-05-28 MED ORDER — AMLODIPINE BESYLATE 10 MG PO TABS: ORAL_TABLET | ORAL | 3 refills | Status: DC

## 2019-05-28 MED ORDER — TRAZODONE HCL 50 MG PO TABS: 25.0000 mg | ORAL_TABLET | Freq: Every evening | ORAL | 3 refills | Status: DC | PRN

## 2019-05-29 LAB — COMPREHENSIVE METABOLIC PANEL
ALT: 63 U/L — ABNORMAL HIGH (ref 0–35)
AST: 33 U/L (ref 0–37)
Albumin: 4.5 g/dL (ref 3.5–5.2)
Alkaline Phosphatase: 93 U/L (ref 39–117)
BUN: 9 mg/dL (ref 6–23)
CO2: 27 mEq/L (ref 19–32)
Calcium: 9.7 mg/dL (ref 8.4–10.5)
Chloride: 103 mEq/L (ref 96–112)
Creatinine, Ser: 0.92 mg/dL (ref 0.40–1.20)
GFR: 64.6 mL/min (ref >60.00)
Glucose, Bld: 97 mg/dL (ref 70–99)
Potassium: 3.7 mEq/L (ref 3.5–5.1)
Sodium: 139 mEq/L (ref 135–145)
Total Bilirubin: 1 mg/dL (ref 0.2–1.2)
Total Protein: 7.3 g/dL (ref 6.0–8.3)

## 2019-05-29 LAB — HIV ANTIBODY (ROUTINE TESTING W REFLEX): HIV 1&2 Ab, 4th Generation: NONREACTIVE

## 2019-05-29 LAB — CBC
HCT: 42.1 % (ref 36.0–46.0)
Hemoglobin: 14.2 g/dL (ref 12.0–15.0)
MCHC: 33.6 g/dL (ref 30.0–36.0)
MCV: 83.2 fl (ref 78.0–100.0)
Platelets: 291 10*3/uL (ref 150.0–400.0)
RBC: 5.07 Mil/uL (ref 3.87–5.11)
RDW: 14.5 % (ref 11.5–15.5)
WBC: 10.8 10*3/uL — ABNORMAL HIGH (ref 4.0–10.5)

## 2019-05-29 LAB — LIPID PANEL
Cholesterol: 253 mg/dL — ABNORMAL HIGH (ref 0–200)
HDL: 36.3 mg/dL — ABNORMAL LOW (ref >39.00)
Total CHOL/HDL Ratio: 7
Triglycerides: 590 mg/dL — ABNORMAL HIGH (ref 0.0–149.0)

## 2019-05-29 LAB — LDL CHOLESTEROL, DIRECT: Direct LDL: 136 mg/dL

## 2019-05-29 LAB — TSH: TSH: 1.27 u[IU]/mL (ref 0.35–4.50)

## 2019-05-29 LAB — HEMOGLOBIN A1C: Hgb A1c MFr Bld: 6.4 % (ref 4.6–6.5)

## 2019-05-29 NOTE — Addendum Note (Signed)
Addended by: Darreld Mclean on: 05/29/2019 06:11 PM   Modules accepted: Orders

## 2019-06-01 ENCOUNTER — Ambulatory Visit (HOSPITAL_BASED_OUTPATIENT_CLINIC_OR_DEPARTMENT_OTHER)
Admission: RE | Admit: 2019-06-01 | Discharge: 2019-06-01 | Disposition: A | Discharge status: Home / Self Care | Payer: BC Managed Care – PPO | Source: Ambulatory Visit | Attending: Family Medicine | Admitting: Family Medicine

## 2019-06-01 ENCOUNTER — Other Ambulatory Visit: Payer: Self-pay

## 2019-06-01 DIAGNOSIS — Z1231 Encounter for screening mammogram for malignant neoplasm of breast: Secondary | ICD-10-CM | POA: Insufficient documentation

## 2019-06-01 DIAGNOSIS — R7401 Elevation of levels of liver transaminase levels: Secondary | ICD-10-CM | POA: Insufficient documentation

## 2019-06-01 DIAGNOSIS — K760 Fatty (change of) liver, not elsewhere classified: Secondary | ICD-10-CM | POA: Diagnosis not present

## 2019-06-04 ENCOUNTER — Encounter: Payer: Self-pay | Admitting: Family Medicine

## 2019-06-06 DIAGNOSIS — Z1211 Encounter for screening for malignant neoplasm of colon: Secondary | ICD-10-CM | POA: Diagnosis not present

## 2019-06-11 LAB — COLOGUARD: Cologuard: POSITIVE — AB

## 2019-06-13 ENCOUNTER — Encounter: Payer: Self-pay | Admitting: Gastroenterology

## 2019-06-13 ENCOUNTER — Telehealth: Payer: Self-pay | Admitting: Family Medicine

## 2019-06-13 DIAGNOSIS — E785 Hyperlipidemia, unspecified: Secondary | ICD-10-CM

## 2019-06-13 DIAGNOSIS — R195 Other fecal abnormalities: Secondary | ICD-10-CM

## 2019-06-13 MED ORDER — ROSUVASTATIN CALCIUM 20 MG PO TABS: 20.0000 mg | ORAL_TABLET | Freq: Every day | ORAL | 3 refills | Status: DC

## 2019-06-13 NOTE — Telephone Encounter (Signed)
Received positive cologuard screening for her today- called pt and let her know, I will refer to GI  She is also wanting to start on a statin for her hyperlipidemia- called in Crestor for her

## 2019-06-15 ENCOUNTER — Encounter: Payer: Self-pay | Admitting: Family Medicine

## 2019-07-17 ENCOUNTER — Encounter: Payer: BC Managed Care – PPO | Admitting: Gastroenterology

## 2019-09-08 ENCOUNTER — Ambulatory Visit: Payer: BC Managed Care – PPO | Attending: Internal Medicine

## 2019-09-08 DIAGNOSIS — Z23 Encounter for immunization: Secondary | ICD-10-CM

## 2019-09-08 NOTE — Progress Notes (Signed)
   Covid-19 Vaccination Clinic  Name:  Lauren Mcclure    MRN: [id number] DOB: [birth year]  09/08/2019  Lauren Mcclure was observed post Covid-19 immunization for 15 minutes without incident. She was provided with Vaccine Information Sheet and instruction to access the V-Safe system.   Lauren Mcclure was instructed to call 911 with any severe reactions post vaccine: Marland Kitchen Difficulty breathing  . Swelling of face and throat  . A fast heartbeat  . A bad rash all over body  . Dizziness and weakness   Immunizations Administered    Name Date Dose VIS Date Route   Pfizer COVID-19 Vaccine 09/08/2019 11:19 AM 0.3 mL 05/30/2018 Intramuscular   Manufacturer: [employer], [employer]   Lot: [id number]   [place]: [id number]

## 2019-10-01 ENCOUNTER — Ambulatory Visit: Payer: BC Managed Care – PPO | Attending: Internal Medicine

## 2019-10-01 DIAGNOSIS — Z23 Encounter for immunization: Secondary | ICD-10-CM

## 2019-10-01 NOTE — Progress Notes (Signed)
   Covid-19 Vaccination Clinic  Name:  Lauren Mcclure    MRN: [id number] DOB: [birth year]  10/01/2019  Lauren Mcclure was observed post Covid-19 immunization for 15 minutes without incident. She was provided with Vaccine Information Sheet and instruction to access the V-Safe system.   Lauren Mcclure was instructed to call 911 with any severe reactions post vaccine: Marland Kitchen Difficulty breathing  . Swelling of face and throat  . A fast heartbeat  . A bad rash all over body  . Dizziness and weakness   Immunizations Administered    Name Date Dose VIS Date Route   Pfizer COVID-19 Vaccine 10/01/2019  8:50 AM 0.3 mL 05/30/2018 Intramuscular   Manufacturer: [employer], [employer]   Lot: [id number]   [place]: [id number]

## 2019-10-12 ENCOUNTER — Other Ambulatory Visit: Payer: Self-pay

## 2019-10-12 DIAGNOSIS — I10 Essential (primary) hypertension: Secondary | ICD-10-CM

## 2019-10-12 DIAGNOSIS — E785 Hyperlipidemia, unspecified: Secondary | ICD-10-CM

## 2019-10-12 MED ORDER — ROSUVASTATIN CALCIUM 20 MG PO TABS: 20.0000 mg | ORAL_TABLET | Freq: Every day | ORAL | 3 refills | Status: DC

## 2019-10-12 MED ORDER — AMLODIPINE BESYLATE 10 MG PO TABS: ORAL_TABLET | ORAL | 3 refills | Status: DC

## 2019-10-18 ENCOUNTER — Telehealth: Payer: Self-pay | Admitting: *Deleted

## 2019-10-18 DIAGNOSIS — M25561 Pain in right knee: Secondary | ICD-10-CM

## 2019-10-18 DIAGNOSIS — G8929 Other chronic pain: Secondary | ICD-10-CM

## 2019-10-18 MED ORDER — CELECOXIB 200 MG PO CAPS: 200.0000 mg | ORAL_CAPSULE | Freq: Every day | ORAL | 0 refills | Status: DC

## 2019-10-18 NOTE — Telephone Encounter (Signed)
Received request from [facility] for Celebrex. Refill sent. Pt last seen by PCP 05/28/19 for CPE. When is pt due for next follow up?

## 2019-10-18 NOTE — Telephone Encounter (Signed)
Pt is UTD on her office visits

## 2019-11-28 DIAGNOSIS — E785 Hyperlipidemia, unspecified: Secondary | ICD-10-CM | POA: Insufficient documentation

## 2019-11-28 DIAGNOSIS — E118 Type 2 diabetes mellitus with unspecified complications: Secondary | ICD-10-CM | POA: Insufficient documentation

## 2019-11-28 NOTE — Patient Instructions (Addendum)
It was good to see you again today!  I will be in touch with your labs asap As we discussed, I am not certain what caused your symptoms earlier this week and your current elevated BP We are going to get labs and schedule an MRI of your head to rule out stroke In the meantime, add losartan 25mg  to your blood pressure regimen Please seek immediate care if you are getting worse or if your symptoms change Tetanus booster next time due to current symptoms   Please call [facility] to schedule your colonoscopy at your earliest convenience  Phone: [phone]

## 2019-11-28 NOTE — Progress Notes (Addendum)
[place] at [facility] [place], [place], [place] [address] [phone]  Date:  11/29/2019   Name:  Lauren Mcclure   DOB:  [birth year]   MRN:  [id number]  PCP:  Darreld Mclean, MD    Chief Complaint: Hypertension (dizziness, even when laying down, bp 190/160 reading at home)   History of Present Illness:  Lauren Mcclure is a 49 y.o. very pleasant female patient who presents with the following:  Patient with history of hypertension, prediabetes, dyslipidemia here today for follow-up visit-however, as below she is not feeling very well today  Last seen by myself in February of this year  Pap-2019 per GYN Colon cancer screening Mammogram up-to-date Covid series is complete Tetanus vaccine- will boost today Patient had some labs done in February- will update lipids today   At that time she had a positive Cologuard-referred to GI, I do not believe she has done colonoscopy yet; she canceled her appt due to logistical concerns.  Is now ready to reschedule -I gave her the phone number for GI and she will call them We also started Crestor for hyperlipidemia  Pt notes that she "nearly passed out" at work this past Monday- today is Thursday Sx lasted 30 minutes or so She felt really dizzy-described as lightheaded, felt SOB and like her body was tingling She was working indoors on her computer at the time  She also has felt intermittently SOB for a couple of months, may have palpitations at times Not experiencing CP  She has not been able to exercise as much lately as her work is too busy She took a few days off work today with these health problems, came to see me  She will check her BP a few times a day- on Monday her BP was quite high.  Has improved since but still higher than normal for her   She is still taking her amlodipine  She has been under more stress recently but no other major changes in her life  Pt is  menopausal- LMP over a year agp    BP Readings from Last 3 Encounters:  05/28/19 122/82  10/25/18 135/89  05/22/18 140/90      Patient Active Problem List   Diagnosis Date Noted  . Dyslipidemia 11/28/2019  . Prediabetes 11/28/2019  . Essential hypertension 05/26/2019    Past Medical History:  Diagnosis Date  . Asthma    as a teenager  . Elevated blood pressure reading   . Headache   . History of hyperlipidemia   . Kidney stones   . 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 LITHOTRIPSY    . NASAL SEPTUM SURGERY  2007  . TONSILLECTOMY      Social History   Tobacco Use  . Smoking status: Never Smoker  . Smokeless tobacco: Never Used  Substance Use Topics  . Alcohol use: Yes    Comment: Occ wine or beer  . Drug use: No    Family History  Problem Relation Age of Onset  . Hypertension Mother   . Hyperlipidemia Mother   . Pancreatic cancer Father        deceased age 84  . Cancer - Other Paternal Grandmother   . Skin cancer Paternal Grandfather   . Heart disease Paternal Grandfather   . Hyperlipidemia Maternal Aunt  Allergies  Allergen Reactions  . Rocephin [Ceftriaxone Sodium In Dextrose] Rash    Medication list has been reviewed and updated.  Current Outpatient Medications on File Prior to Visit  Medication Sig Dispense Refill  . amLODipine (NORVASC) 10 MG tablet TAKE 1 TABLET(10 MG) BY MOUTH DAILY 90 tablet 3  . Ascorbic Acid (VITAMIN C PO) Take 1 tablet by mouth daily.    . Calcium Carbonate-Vit D-Min (CALCIUM 1200 PO) Take by mouth.    . celecoxib (CELEBREX) 200 MG capsule Take 1 capsule (200 mg total) by mouth daily. Use as needed for knee pain 30 capsule 0  . CRANBERRY PO Take 1 tablet by mouth daily.    . Multiple Vitamin (MULTIVITAMIN) tablet Take 1 tablet by mouth daily.    . rosuvastatin (CRESTOR) 20 MG tablet Take 1 tablet (20 mg  total) by mouth daily. 90 tablet 3  . traZODone (DESYREL) 50 MG tablet Take 0.5-1 tablets (25-50 mg total) by mouth at bedtime as needed for sleep. 30 tablet 3  . TURMERIC PO Take 1 tablet by mouth daily.     No current facility-administered medications on file prior to visit.    Review of Systems:  As per HPI- otherwise negative.   Physical Examination: Vitals:   11/29/19 1442  Resp: 15  SpO2: 97%   Vitals:   11/29/19 1442  Weight: 177 lb (80.3 kg)  Height: 5\' 6"  (1.676 m)   Body mass index is 28.57 kg/m. Ideal Body Weight: Weight in (lb) to have BMI = 25: 154.6  GEN: no acute distress.  Mild overweight, looks well and her normal self HEENT: Atraumatic, Normocephalic.  Ears and Nose: No external deformity. CV: RRR, No M/G/R. No JVD. No thrill. No extra heart sounds. PULM: CTA B, no wheezes, crackles, rhonchi. No retractions. No resp. distress. No accessory muscle use. ABD: S, NT, ND, +BS. No rebound. No HSM. EXTR: No c/c/e PSYCH: Normally interactive. Conversant.  Normal strength, sensation, DTR of all limbs.  Normal deep tendon reflexes of all limbs, normal gait and Romberg  EKG:  SR, compared with EKG from 2018 no change noted  Orthostatic VS for the past 72 hrs (Last 3 readings):  Orthostatic BP Patient Position BP Location Cuff Size Orthostatic Pulse  11/29/19 1458 (!) 138/102 Standing Left Arm Large 107  11/29/19 1457 (!) 144/100 Supine Left Arm Large 89  11/29/19 1456 (!) 144/108 Sitting Left Arm Large 80    Assessment and Plan: Essential hypertension - Plan: Comprehensive metabolic panel, losartan (COZAAR) 25 MG tablet  Dyslipidemia - Plan: Lipid panel  Encounter for hepatitis C screening test for low risk patient - Plan: Hepatitis C antibody  Prediabetes - Plan: Hemoglobin A1c  SOB (shortness of breath) - Plan: EKG 12-Lead, D-Dimer, Quantitative, Troponin I -, DG Chest 2 View, CANCELED: Troponin I -  Other chest pain - Plan: CT ANGIO CHEST PE W OR WO  CONTRAST  Dizziness - Plan: MR Brain Wo Contrast  Numbness of extremity - Plan: MR Brain Wo Contrast     Patient here today for follow-up visit-she has not felt great the last few days. She experienced and episode of SOB and body tingling. Symptoms were worse on Monday and Tuesday, she feels relatively better now but still not 100% I offered her the option of emergency room evaluation, explained why this could be to her benefit.  At this point she declines to be seen at the ER given concerns of current pandemic and ER crowding which are  also valid. We will plan to closely follow-up and evaluate outpatient I will obtain a D-dimer, troponin, basic labs, chest x-ray and plan next steps accordingly We will add losartan 25 mg to her blood pressure regimen, continue amlodipine Plan for outpatient MRI of brain  This visit occurred during the SARS-CoV-2 public health emergency.  Safety protocols were in place, including screening questions prior to the visit, additional usage of staff PPE, and extensive cleaning of exam room while observing appropriate contact time as indicated for disinfecting solutions.    Signed Lamar Blinks, MD  Received patient's D-dimer as below, gave her a call but no no answer, no VM, no mychart I will arrange for CT angiogram asap in addition to MRI of her head Troponin is negative  Will try and contact her again in the AM Results for orders placed or performed in visit on 11/29/19  Comprehensive metabolic panel  Result Value Ref Range   Glucose, Bld 98 65 - 99 mg/dL   BUN 13 7 - 25 mg/dL   Creat 0.82 0.50 - 1.05 mg/dL   BUN/Creatinine Ratio NOT APPLICABLE 6 - 22 (calc)   Sodium 140 135 - 146 mmol/L   Potassium 4.1 3.5 - 5.3 mmol/L   Chloride 101 98 - 110 mmol/L   CO2 27 20 - 32 mmol/L   Calcium 10.8 (H) 8.6 - 10.4 mg/dL   Total Protein 7.4 6.1 - 8.1 g/dL   Albumin 4.5 3.6 - 5.1 g/dL   Globulin 2.9 1.9 - 3.7 g/dL (calc)   AG Ratio 1.6 1.0 - 2.5 (calc)    Total Bilirubin 1.8 (H) 0.2 - 1.2 mg/dL   Alkaline phosphatase (APISO) 99 37 - 153 U/L   AST 96 (H) 10 - 35 U/L   ALT 133 (H) 6 - 29 U/L  Hemoglobin A1c  Result Value Ref Range   Hgb A1c MFr Bld 6.5 (H) <5.7 % of total Hgb   Mean Plasma Glucose 140 (calc)   eAG (mmol/L) 7.7 (calc)  Lipid panel  Result Value Ref Range   Cholesterol 246 (H) <200 mg/dL   HDL 43 (L) > OR = 50 mg/dL   Triglycerides 702 (H) <150 mg/dL   LDL Cholesterol (Calc)  mg/dL (calc)   Total CHOL/HDL Ratio 5.7 (H) <5.0 (calc)   Non-HDL Cholesterol (Calc) 203 (H) <130 mg/dL (calc)  Hepatitis C antibody  Result Value Ref Range   Hepatitis C Ab NON-REACTIVE NON-REACTI   SIGNAL TO CUT-OFF 0.00 <1.00  D-Dimer, Quantitative  Result Value Ref Range   D-Dimer, Quant 0.77 (H) <0.50 mcg/mL FEU  Troponin I -  Result Value Ref Range   Troponin I <3 < OR = 47 ng/L   Called pt on 8/27 and reached her about positive D-dimer- let her know we will get a CT angiogram for her today. She states understanding   Addendum 8/27, received her CT angiogram (as well as chest film from yesterday) and labs Called patient to discuss-no sign of pulmonary embolism which is good news Her labs show elevated bilirubin and LFTs, consider gallbladder disease.  Patient notes her father had history of cholecystitis and cholecystectomy.  Will obtain right upper quadrant ultrasound Patient admits she has been using Tylenol regularly the last couple of weeks.  I asked her to please DC Tylenol and change her ibuprofen or Aleve as needed for general aches and pains She is taking her Crestor on a regular basis  Patient would like to be out of work for about  a week to sort out her health problems which I think is appropriate.  I called her HR representative HR phone [phone]; Lucile Crater assistant HR with [employer]  They need a letter stating that we will have her OOW returning to work on 9/6 No restrictions Email to  [email or web address] Letter prepared and will send to my assistant Mel Almond to email    DG Chest 2 View  Result Date: 11/29/2019 CLINICAL DATA:  Shortness of breath EXAM: CHEST - 2 VIEW COMPARISON:  11/09/2016 FINDINGS: The heart size and mediastinal contours are within normal limits. No focal airspace consolidation, pleural effusion, or pneumothorax. The visualized skeletal structures are unremarkable. IMPRESSION: No active cardiopulmonary disease. Electronically Signed   By: Davina Poke D.O.   On: 11/29/2019 16:28   CT ANGIO CHEST PE W OR WO CONTRAST  Result Date: 11/30/2019 CLINICAL DATA:  Chest pain, near syncope. EXAM: CT ANGIOGRAPHY CHEST WITH CONTRAST TECHNIQUE: Multidetector CT imaging of the chest was performed using the standard protocol during bolus administration of intravenous contrast. Multiplanar CT image reconstructions and MIPs were obtained to evaluate the vascular anatomy. CONTRAST:  75 mL Isovue 370 COMPARISON:  None. FINDINGS: Cardiovascular: Satisfactory opacification of the pulmonary arteries to the segmental level. No evidence of pulmonary embolism. Normal heart size. No pericardial effusion. Mediastinum/Nodes: No enlarged mediastinal, hilar, or axillary lymph nodes. Trachea and esophagus are unremarkable. There is a 2.7 cm nodule in the right thyroid gland and a 0.9 cm nodule in the left thyroid gland. Lungs/Pleura: Lungs are clear. No pleural effusion or pneumothorax. Upper Abdomen: No acute abnormality. Musculoskeletal: No chest wall abnormality. No acute or significant osseous findings. Review of the MIP images confirms the above findings. IMPRESSION: 1. No evidence of pulmonary embolism or other acute intrathoracic process. 2. Bilateral thyroid nodules measuring up to 2.7 cm on the right. Further evaluation with nonemergent thyroid ultrasound is recommended. Electronically Signed   By: Audie Pinto M.D.   On: 11/30/2019 11:31

## 2019-11-29 ENCOUNTER — Ambulatory Visit (INDEPENDENT_AMBULATORY_CARE_PROVIDER_SITE_OTHER): Payer: BC Managed Care – PPO | Admitting: Family Medicine

## 2019-11-29 ENCOUNTER — Ambulatory Visit (HOSPITAL_BASED_OUTPATIENT_CLINIC_OR_DEPARTMENT_OTHER)
Admission: RE | Admit: 2019-11-29 | Discharge: 2019-11-29 | Disposition: A | Discharge status: Home / Self Care | Payer: BC Managed Care – PPO | Source: Ambulatory Visit | Attending: Family Medicine | Admitting: Family Medicine

## 2019-11-29 ENCOUNTER — Other Ambulatory Visit: Payer: Self-pay

## 2019-11-29 ENCOUNTER — Encounter: Payer: Self-pay | Admitting: Family Medicine

## 2019-11-29 VITALS — Resp 15 | Ht 66.0 in | Wt 177.0 lb

## 2019-11-29 DIAGNOSIS — R0789 Other chest pain: Secondary | ICD-10-CM

## 2019-11-29 DIAGNOSIS — R0602 Shortness of breath: Secondary | ICD-10-CM

## 2019-11-29 DIAGNOSIS — Z1159 Encounter for screening for other viral diseases: Secondary | ICD-10-CM | POA: Diagnosis not present

## 2019-11-29 DIAGNOSIS — I10 Essential (primary) hypertension: Secondary | ICD-10-CM | POA: Diagnosis not present

## 2019-11-29 DIAGNOSIS — R7401 Elevation of levels of liver transaminase levels: Secondary | ICD-10-CM

## 2019-11-29 DIAGNOSIS — R200 Anesthesia of skin: Secondary | ICD-10-CM

## 2019-11-29 DIAGNOSIS — R7303 Prediabetes: Secondary | ICD-10-CM | POA: Diagnosis not present

## 2019-11-29 DIAGNOSIS — E785 Hyperlipidemia, unspecified: Secondary | ICD-10-CM | POA: Diagnosis not present

## 2019-11-29 DIAGNOSIS — R42 Dizziness and giddiness: Secondary | ICD-10-CM

## 2019-11-29 LAB — TROPONIN I: Troponin I: <3 ng/L

## 2019-11-29 MED ORDER — LOSARTAN POTASSIUM 25 MG PO TABS: 25.0000 mg | ORAL_TABLET | Freq: Every day | ORAL | 3 refills | Status: DC

## 2019-11-30 ENCOUNTER — Ambulatory Visit
Admission: RE | Admit: 2019-11-30 | Discharge: 2019-11-30 | Disposition: A | Discharge status: Home / Self Care | Payer: BC Managed Care – PPO | Source: Ambulatory Visit | Attending: Family Medicine | Admitting: Family Medicine

## 2019-11-30 ENCOUNTER — Telehealth: Payer: Self-pay

## 2019-11-30 ENCOUNTER — Telehealth: Payer: Self-pay | Admitting: Family Medicine

## 2019-11-30 DIAGNOSIS — R0789 Other chest pain: Secondary | ICD-10-CM

## 2019-11-30 LAB — HEPATITIS C ANTIBODY
Hepatitis C Ab: NONREACTIVE
SIGNAL TO CUT-OFF: 0 (ref <1.00)

## 2019-11-30 LAB — COMPREHENSIVE METABOLIC PANEL
AG Ratio: 1.6 (calc) (ref 1.0–2.5)
ALT: 133 U/L — ABNORMAL HIGH (ref 6–29)
AST: 96 U/L — ABNORMAL HIGH (ref 10–35)
Albumin: 4.5 g/dL (ref 3.6–5.1)
Alkaline phosphatase (APISO): 99 U/L (ref 37–153)
BUN: 13 mg/dL (ref 7–25)
CO2: 27 mmol/L (ref 20–32)
Calcium: 10.8 mg/dL — ABNORMAL HIGH (ref 8.6–10.4)
Chloride: 101 mmol/L (ref 98–110)
Creat: 0.82 mg/dL (ref 0.50–1.05)
Globulin: 2.9 g/dL (calc) (ref 1.9–3.7)
Glucose, Bld: 98 mg/dL (ref 65–99)
Potassium: 4.1 mmol/L (ref 3.5–5.3)
Sodium: 140 mmol/L (ref 135–146)
Total Bilirubin: 1.8 mg/dL — ABNORMAL HIGH (ref 0.2–1.2)
Total Protein: 7.4 g/dL (ref 6.1–8.1)

## 2019-11-30 LAB — HCG, TOTAL, QUANTITATIVE: hCG, Beta Chain, Quant, S: 5 m[IU]/mL — ABNORMAL HIGH

## 2019-11-30 LAB — LIPID PANEL
Cholesterol: 246 mg/dL — ABNORMAL HIGH (ref <200)
HDL: 43 mg/dL — ABNORMAL LOW (ref >=50)
Non-HDL Cholesterol (Calc): 203 mg/dL (calc) — ABNORMAL HIGH (ref <130)
Total CHOL/HDL Ratio: 5.7 (calc) — ABNORMAL HIGH (ref <5.0)
Triglycerides: 702 mg/dL — ABNORMAL HIGH (ref <150)

## 2019-11-30 LAB — HEMOGLOBIN A1C
Hgb A1c MFr Bld: 6.5 % of total Hgb — ABNORMAL HIGH (ref <5.7)
Mean Plasma Glucose: 140 (calc)
eAG (mmol/L): 7.7 (calc)

## 2019-11-30 LAB — D-DIMER, QUANTITATIVE: D-Dimer, Quant: 0.77 mcg/mL FEU — ABNORMAL HIGH (ref <0.50)

## 2019-11-30 MED ORDER — IOPAMIDOL (ISOVUE-370) INJECTION 76%
75.0000 mL | Freq: Once | INTRAVENOUS | Status: AC | PRN
  Administered 2019-11-30: 75 mL via INTRAVENOUS

## 2019-11-30 NOTE — Telephone Encounter (Signed)
Called patient to let her know that Dr. Lorelei Pont placed a stat ct angio for patient due to positive D-Dimer. Patients CT has been authorized. Patient states she is on her way to [facility] now.

## 2019-11-30 NOTE — Telephone Encounter (Signed)
Good Afternoon. Patient states she was speaking to you earlier and is awaiting for you to call her back. She also has questions about her return to work note for  Monday and if she has any restrictions.

## 2019-11-30 NOTE — Addendum Note (Signed)
Addended by: Lamar Blinks C on: 11/30/2019 02:34 PM   Modules accepted: Orders

## 2019-12-03 ENCOUNTER — Telehealth: Payer: Self-pay | Admitting: Family Medicine

## 2019-12-03 DIAGNOSIS — E041 Nontoxic single thyroid nodule: Secondary | ICD-10-CM

## 2019-12-03 NOTE — Telephone Encounter (Signed)
Called her to check in and go over the further details from her recent CT angiogram-specifically thyroid nodule Her am BP is still running 140- 160/120 Later in the day will come down to 120/80 after she takes her medications at about 9 AM She is taking both losartan and amlodipine in the am I encouraged her to try taking one of her medications in the evening to see if this will help with blood pressure variation  Advised her that I am going to add on a thyroid US for her as well- will try to have this done the same time as her right upper quadrant ultrasound this coming Thursday

## 2019-12-06 ENCOUNTER — Other Ambulatory Visit: Payer: BC Managed Care – PPO

## 2019-12-07 ENCOUNTER — Ambulatory Visit
Admission: RE | Admit: 2019-12-07 | Discharge: 2019-12-07 | Disposition: A | Discharge status: Home / Self Care | Payer: BC Managed Care – PPO | Source: Ambulatory Visit | Attending: Family Medicine | Admitting: Family Medicine

## 2019-12-07 DIAGNOSIS — R7401 Elevation of levels of liver transaminase levels: Secondary | ICD-10-CM

## 2019-12-07 DIAGNOSIS — E041 Nontoxic single thyroid nodule: Secondary | ICD-10-CM

## 2019-12-10 ENCOUNTER — Telehealth: Payer: Self-pay | Admitting: Family Medicine

## 2019-12-10 DIAGNOSIS — E041 Nontoxic single thyroid nodule: Secondary | ICD-10-CM

## 2019-12-10 DIAGNOSIS — I10 Essential (primary) hypertension: Secondary | ICD-10-CM

## 2019-12-10 DIAGNOSIS — R7401 Elevation of levels of liver transaminase levels: Secondary | ICD-10-CM

## 2019-12-10 MED ORDER — LOSARTAN POTASSIUM 50 MG PO TABS: 50.0000 mg | ORAL_TABLET | Freq: Every day | ORAL | 6 refills | Status: DC

## 2019-12-10 NOTE — Telephone Encounter (Signed)
Called pt yesterday and briefly went over her recent ultrasound reports, checked on her.  She was feeling overall better.  Called back today to discuss in more detail   IMPRESSION: 1. Findings suggestive of multinodular goiter. 2. Nodule #3, correlating with the left-sided nodule seen on preceding chest CT, meets imaging criteria to recommend percutaneous sampling as indicated. 3. None of the remaining discretely measured thyroid nodules, including the dominant right-sided nodule seen on preceding chest CT, meet imaging criteria to recommend percutaneous sampling or continued dedicated follow-up.  The above is in keeping with the ACR TI-RADS recommendations - J Am Coll Radiol 1292;90:903-014.--------------------------------------------------------  IMPRESSION: 1. At least moderate hepatic steatosis. 2. Indeterminate solid 8 mm x 7 mm x 7 mm lesion within the right hepatic lobe. Further evaluation with contrast enhanced liver protocol MRI may be helpful for further characterization. 3. No evidence of cholelithiasis or acute cholecystitis.  She notes that her BP is somewhat improved since we added losartan 25 pm, continue amlodipine 10 in the am.  However still running 140s/90s She is walking some for exercise Working on her diet We will increase losartan to 50 mg Order thyroid bx  We will get her in for a lab visit soon to recheck LFTS

## 2019-12-11 NOTE — Telephone Encounter (Signed)
Patient has been scheduled for appointment. 

## 2019-12-18 ENCOUNTER — Ambulatory Visit
Admission: RE | Admit: 2019-12-18 | Discharge: 2019-12-18 | Disposition: A | Discharge status: Home / Self Care | Payer: BC Managed Care – PPO | Source: Ambulatory Visit | Attending: Family Medicine | Admitting: Family Medicine

## 2019-12-18 ENCOUNTER — Other Ambulatory Visit (HOSPITAL_COMMUNITY)
Admission: RE | Admit: 2019-12-18 | Discharge: 2019-12-18 | Disposition: A | Discharge status: Home / Self Care | Payer: BC Managed Care – PPO | Source: Ambulatory Visit | Attending: Radiology | Admitting: Radiology

## 2019-12-18 DIAGNOSIS — D440 Neoplasm of uncertain behavior of thyroid gland: Secondary | ICD-10-CM | POA: Diagnosis not present

## 2019-12-18 DIAGNOSIS — E041 Nontoxic single thyroid nodule: Secondary | ICD-10-CM | POA: Diagnosis present

## 2019-12-19 LAB — CYTOLOGY - NON PAP

## 2019-12-21 ENCOUNTER — Other Ambulatory Visit: Payer: BC Managed Care – PPO

## 2019-12-21 ENCOUNTER — Telehealth: Payer: Self-pay | Admitting: Family Medicine

## 2019-12-21 DIAGNOSIS — E041 Nontoxic single thyroid nodule: Secondary | ICD-10-CM

## 2019-12-21 NOTE — Telephone Encounter (Signed)
Spoke with pt, let her know thyroid bx was not adequate. She is willing to undergo repeat bx. Will order for her now Discussed her ruq [place].  I discussed her report with one of the radiologists who told me MRI would be a more definitive way to define her liver mass.  However, she is getting repeat labs next week. We decided to see how her labs look and then decide on next step

## 2019-12-21 NOTE — Telephone Encounter (Signed)
Called pt to go over her recent thyroid bx- unfortunately it was not sufficient for diagnosis  Called and LMOM- unfortunately we need to re-do bx. Will try her back to discuss   FINAL MICROSCOPIC DIAGNOSIS:  - Scant follicular epithelium present (Bethesda category I)   SPECIMEN ADEQUACY:  Satisfactory but limited for evaluation, scant cellularity

## 2019-12-25 ENCOUNTER — Other Ambulatory Visit: Payer: BC Managed Care – PPO

## 2019-12-25 ENCOUNTER — Other Ambulatory Visit: Payer: Self-pay

## 2019-12-25 DIAGNOSIS — I10 Essential (primary) hypertension: Secondary | ICD-10-CM

## 2019-12-25 DIAGNOSIS — R7401 Elevation of levels of liver transaminase levels: Secondary | ICD-10-CM

## 2019-12-25 LAB — COMPREHENSIVE METABOLIC PANEL
AG Ratio: 1.5 (calc) (ref 1.0–2.5)
ALT: 94 U/L — ABNORMAL HIGH (ref 6–29)
AST: 59 U/L — ABNORMAL HIGH (ref 10–35)
Albumin: 4.3 g/dL (ref 3.6–5.1)
Alkaline phosphatase (APISO): 101 U/L (ref 37–153)
BUN: 9 mg/dL (ref 7–25)
CO2: 27 mmol/L (ref 20–32)
Calcium: 9.4 mg/dL (ref 8.6–10.4)
Chloride: 104 mmol/L (ref 98–110)
Creat: 0.83 mg/dL (ref 0.50–1.05)
Globulin: 2.8 g/dL (calc) (ref 1.9–3.7)
Glucose, Bld: 113 mg/dL — ABNORMAL HIGH (ref 65–99)
Potassium: 3.9 mmol/L (ref 3.5–5.3)
Sodium: 141 mmol/L (ref 135–146)
Total Bilirubin: 1.8 mg/dL — ABNORMAL HIGH (ref 0.2–1.2)
Total Protein: 7.1 g/dL (ref 6.1–8.1)

## 2019-12-25 LAB — CBC
HCT: 46 % — ABNORMAL HIGH (ref 35.0–45.0)
Hemoglobin: 15.5 g/dL (ref 11.7–15.5)
MCH: 28.9 pg (ref 27.0–33.0)
MCHC: 33.7 g/dL (ref 32.0–36.0)
MCV: 85.8 fL (ref 80.0–100.0)
MPV: 9.5 fL (ref 7.5–12.5)
Platelets: 287 10*3/uL (ref 140–400)
RBC: 5.36 10*6/uL — ABNORMAL HIGH (ref 3.80–5.10)
RDW: 14 % (ref 11.0–15.0)
WBC: 8.1 10*3/uL (ref 3.8–10.8)

## 2019-12-26 ENCOUNTER — Ambulatory Visit
Admission: RE | Admit: 2019-12-26 | Discharge: 2019-12-26 | Disposition: A | Discharge status: Home / Self Care | Payer: BC Managed Care – PPO | Source: Ambulatory Visit | Attending: Family Medicine | Admitting: Family Medicine

## 2019-12-26 ENCOUNTER — Other Ambulatory Visit (HOSPITAL_COMMUNITY)
Admission: RE | Admit: 2019-12-26 | Discharge: 2019-12-26 | Disposition: A | Discharge status: Home / Self Care | Payer: BC Managed Care – PPO | Source: Ambulatory Visit | Attending: Family Medicine | Admitting: Family Medicine

## 2019-12-26 DIAGNOSIS — E041 Nontoxic single thyroid nodule: Secondary | ICD-10-CM | POA: Insufficient documentation

## 2019-12-27 ENCOUNTER — Telehealth: Payer: Self-pay | Admitting: Family Medicine

## 2019-12-27 LAB — CYTOLOGY - NON PAP

## 2019-12-27 NOTE — Telephone Encounter (Signed)
Call patient to go over recent lab results.  She had her repeat thyroid biopsy yesterday, results are pending Patient reports she continues to feel basically well.  Her LFTs are trending down, though not back to normal  We decided to visit in clinic next week to check on how she is doing and repeat liver tests-made an appointment for Wednesday the 29th

## 2019-12-31 ENCOUNTER — Telehealth: Payer: Self-pay | Admitting: Family Medicine

## 2019-12-31 DIAGNOSIS — C73 Malignant neoplasm of thyroid gland: Secondary | ICD-10-CM

## 2019-12-31 NOTE — Telephone Encounter (Signed)
Called her to let her know that unfortunately she does have thyroid cancer  CYTOLOGY - NON PAP  CASE: [id number]  PATIENT: Lauren Mcclure  Non-Gynecological Cytology Report   Clinical History: Left; Inferior this nodule correlates with the  questioned nodule seen within the left lobe of the thyroid on preceding  chest ct, Maximum Size: 1.6 cm; Other 2 dimensions: 1.1 x 0.9 cm,  solid/almost completely solid (2), Hypoechoic (2), ACR TI-RADS Total 5  Specimen Submitted: A. 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

## 2019-12-31 NOTE — Progress Notes (Addendum)
[facility] [address] [place], [place] [address] [phone]  Date:  01/02/2020   Name:  Lauren Mcclure   DOB:  28-Mar-1971   MRN:  [id number]  PCP:  Darreld Mclean, MD    Chief Complaint: Follow-up (hypertension follow up)   History of Present Illness:  Lauren Mcclure is a 49 y.o. very pleasant female patient who presents with the following:  Patient today for short-term follow-up-history of hypertension, prediabetes, dyslipidemia She was seen in my office on August 26 due to presyncope a few days prior She also complained of shortness of breath and a sensation of her body tingling Her blood pressure was elevated at that time.  We added losartan to her regimen obtained work-up including a D-dimer which was positive, elevated AST and ALT  Positive D-dimer led to a CT angiogram-no DVT but she was found to have a thyroid nodule Thyroid biopsy September 22 unfortunately revealed cancer.  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  September 3 IMPRESSION: 1. At least moderate hepatic steatosis. 2. Indeterminate solid 8 mm x 7 mm x 7 mm lesion within the right hepatic lobe. Further evaluation with contrast enhanced liver protocol MRI may be helpful for further characterization. 3. No evidence of cholelithiasis or acute cholecystitis.  Recheck liver function tests on September 21 showed some improvement of AST and ALT  Pt notes that she is generally feeling ok, but she may get a bit dizzy or have a headache off and on, she may get tired  She is back playing tennis again which she enjoys  We discussed her liver US- she would like to go ahead and liver MRI which I will order   Patient Active Problem List   Diagnosis Date Noted  . Dyslipidemia  11/28/2019  . Prediabetes 11/28/2019  . Essential hypertension 05/26/2019    Past Medical History:  Diagnosis Date  . Asthma    as a teenager  . Elevated blood pressure reading   . Headache   . History of hyperlipidemia   . Kidney stones   . 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 LITHOTRIPSY    . NASAL SEPTUM SURGERY  2007  . TONSILLECTOMY      Social History   Tobacco Use  . Smoking status: Never Smoker  . Smokeless tobacco: Never Used  Substance Use Topics  . Alcohol use: Yes    Comment: Occ wine or beer  . Drug use: No    Family History  Problem Relation Age of Onset  . Hypertension Mother   . Hyperlipidemia Mother   . Pancreatic cancer Father        deceased age 26  . Cancer - Other Paternal Grandmother   . Skin cancer Paternal Grandfather   . Heart disease Paternal Grandfather   . Hyperlipidemia Maternal Aunt     Allergies  Allergen Reactions  . Rocephin [Ceftriaxone Sodium In Dextrose] Rash    Medication list has been reviewed and updated.  Current Outpatient Medications on File Prior to Visit  Medication Sig Dispense Refill  . amLODipine (NORVASC) 10 MG tablet TAKE 1 TABLET(10 MG) BY MOUTH DAILY 90 tablet 3  . Ascorbic Acid (  VITAMIN C PO) Take 1 tablet by mouth daily.    . Calcium Carbonate-Vit D-Min (CALCIUM 1200 PO) Take by mouth.    . celecoxib (CELEBREX) 200 MG capsule Take 1 capsule (200 mg total) by mouth daily. Use as needed for knee pain 30 capsule 0  . CRANBERRY PO Take 1 tablet by mouth daily.    . Multiple Vitamin (MULTIVITAMIN) tablet Take 1 tablet by mouth daily.    . rosuvastatin (CRESTOR) 20 MG tablet Take 1 tablet (20 mg total) by mouth daily. 90 tablet 3  . traZODone (DESYREL) 50 MG tablet Take 0.5-1 tablets (25-50 mg total) by mouth at bedtime as needed for sleep. 30 tablet 3  . TURMERIC PO Take 1 tablet by mouth daily.     Marland Kitchen losartan (COZAAR) 25 MG tablet Take 25 mg by mouth daily.     No current facility-administered medications on file prior to visit.    Review of Systems:  As per HPI- otherwise negative.   Physical Examination: Vitals:   01/02/20 1423  BP: 128/90  Pulse: 81  Resp: 15  SpO2: 97%   Vitals:   01/02/20 1423  Weight: 175 lb (79.4 kg)  Height: 5\' 6"  (1.676 m)   Body mass index is 28.25 kg/m. Ideal Body Weight: Weight in (lb) to have BMI = 25: 154.6  GEN: no acute distress.  Mild overweight, looks well  HEENT: Atraumatic, Normocephalic. Healing thyroid bx site Ears and Nose: No external deformity. CV: RRR, No M/G/R. No JVD. No thrill. No extra heart sounds. PULM: CTA B, no wheezes, crackles, rhonchi. No retractions. No resp. distress. No accessory muscle use. ABD: S, NT, ND, +BS. No rebound. No HSM. EXTR: No c/c/e PSYCH: Normally interactive. Conversant.    Assessment and Plan: Liver lesion  Liver disease - Plan: Bilirubin, neonatal (fractionated - tot/dir/indir), Hepatic function panel, MR LIVER W CONTRAST  Transaminitis - Plan: Bilirubin, neonatal (fractionated - tot/dir/indir), Hepatic function panel  Essential hypertension  Thyroid cancer [facility])  Referral for hepatic MRI Repeat LFTs today Referral in to general surgery and endocrinology for her thyroid cancer Will plan further follow- up pending labs. She will contact me if any concerns BP under good control currently  This visit occurred during the SARS-CoV-2 public health emergency.  Safety protocols were in place, including screening questions prior to the visit, additional usage of staff PPE, and extensive cleaning of exam room while observing appropriate contact time as indicated for disinfecting solutions.    Signed Lamar Blinks, MD  Called pt to go over labs  Results for orders placed or performed in visit on 01/02/20  Hepatic function panel  Result Value Ref Range   Total Protein 7.3 6.1 -  8.1 g/dL   Albumin 4.7 3.6 - 5.1 g/dL   Globulin 2.6 1.9 - 3.7 g/dL (calc)   AG Ratio 1.8 1.0 - 2.5 (calc)   Total Bilirubin 1.7 (H) 0.2 - 1.2 mg/dL   Bilirubin, Direct 0.3 (H) 0.0 - 0.2 mg/dL   Indirect Bilirubin 1.4 (H) 0.2 - 1.2 mg/dL (calc)   Alkaline phosphatase (APISO) 97 37 - 153 U/L   AST 59 (H) 10 - 35 U/L   ALT 77 (H) 6 - 29 U/L   Hepatic function continuing to gradually improve Liver MRI is ordered  From liver US: IMPRESSION: 1. At least moderate hepatic steatosis. 2. Indeterminate solid 8 mm x 7 mm x 7 mm lesion within the right hepatic lobe. Further evaluation with contrast enhanced liver protocol MRI  may be helpful for further characterization. 3. No evidence of cholelithiasis or acute cholecystitis.  LFTS may be due to fatty liver She is instructed to call [facility] and request appt for her thyroid if no call by next week

## 2020-01-02 ENCOUNTER — Encounter: Payer: Self-pay | Admitting: Family Medicine

## 2020-01-02 ENCOUNTER — Other Ambulatory Visit: Payer: Self-pay

## 2020-01-02 ENCOUNTER — Ambulatory Visit (INDEPENDENT_AMBULATORY_CARE_PROVIDER_SITE_OTHER): Payer: BC Managed Care – PPO | Admitting: Family Medicine

## 2020-01-02 VITALS — BP 128/90 | HR 81 | Resp 15 | Ht 66.0 in | Wt 175.0 lb

## 2020-01-02 DIAGNOSIS — R7401 Elevation of levels of liver transaminase levels: Secondary | ICD-10-CM | POA: Diagnosis not present

## 2020-01-02 DIAGNOSIS — K769 Liver disease, unspecified: Secondary | ICD-10-CM | POA: Diagnosis not present

## 2020-01-02 DIAGNOSIS — I10 Essential (primary) hypertension: Secondary | ICD-10-CM | POA: Diagnosis not present

## 2020-01-02 DIAGNOSIS — C73 Malignant neoplasm of thyroid gland: Secondary | ICD-10-CM | POA: Diagnosis not present

## 2020-01-02 NOTE — Patient Instructions (Signed)
Great to see you again today!  I will be in touch with your labs asap We will set you up for an MRI of the little spot seen on your liver Please let me know if you don't hear from your surgeon or endocrinologist by Monday

## 2020-01-03 LAB — HEPATIC FUNCTION PANEL
AG Ratio: 1.8 (calc) (ref 1.0–2.5)
ALT: 77 U/L — ABNORMAL HIGH (ref 6–29)
AST: 59 U/L — ABNORMAL HIGH (ref 10–35)
Albumin: 4.7 g/dL (ref 3.6–5.1)
Alkaline phosphatase (APISO): 97 U/L (ref 37–153)
Bilirubin, Direct: 0.3 mg/dL — ABNORMAL HIGH (ref 0.0–0.2)
Globulin: 2.6 g/dL (calc) (ref 1.9–3.7)
Indirect Bilirubin: 1.4 mg/dL (calc) — ABNORMAL HIGH (ref 0.2–1.2)
Total Bilirubin: 1.7 mg/dL — ABNORMAL HIGH (ref 0.2–1.2)
Total Protein: 7.3 g/dL (ref 6.1–8.1)

## 2020-01-04 ENCOUNTER — Other Ambulatory Visit: Payer: Self-pay | Admitting: Family Medicine

## 2020-01-04 MED ORDER — ZOLPIDEM TARTRATE 5 MG PO TABS: 2.5000 mg | ORAL_TABLET | Freq: Every evening | ORAL | 1 refills | Status: DC | PRN

## 2020-01-04 NOTE — Progress Notes (Signed)
Pt is having insomnia, not using trazodone as it makes her too hung over the next day.  Admits she  Is taking some ambien from her husband.  Will write her an rx of her own, urge to use sparingly

## 2020-01-08 ENCOUNTER — Encounter: Payer: Self-pay | Admitting: Internal Medicine

## 2020-01-08 ENCOUNTER — Other Ambulatory Visit: Payer: Self-pay

## 2020-01-08 ENCOUNTER — Encounter: Payer: Self-pay | Admitting: Family Medicine

## 2020-01-08 ENCOUNTER — Ambulatory Visit (INDEPENDENT_AMBULATORY_CARE_PROVIDER_SITE_OTHER): Payer: BC Managed Care – PPO | Admitting: Internal Medicine

## 2020-01-08 VITALS — BP 122/88 | HR 81 | Ht 66.0 in | Wt 178.0 lb

## 2020-01-08 DIAGNOSIS — C73 Malignant neoplasm of thyroid gland: Secondary | ICD-10-CM | POA: Diagnosis not present

## 2020-01-08 LAB — TSH: TSH: 0.8 mIU/L

## 2020-01-08 LAB — T4, FREE: Free T4: 1.1 ng/dL (ref 0.8–1.8)

## 2020-01-08 NOTE — Patient Instructions (Signed)
-   Stop by the lab today

## 2020-01-08 NOTE — Progress Notes (Signed)
Name: Lauren Mcclure  MRN/ DOB: [id number], 1970/05/19    Age/ Sex: 49 y.o., female    PCP: Mcclure, Lauren Filler, MD   Reason for Endocrinology Evaluation: MNG     Date of Initial Endocrinology Evaluation: 01/08/2020     HPI: [name] is a 49 y.o. female with a past medical history .  The patient presented for initial endocrinology clinic visit on 01/08/2020 for consultative assistance with her MNG.   Pt was noted to have an incidental finding of MNG on CT scan of the neck during evaluation of chest pain in 11/2019. She is S/P FNA of the left inferior nodule 1.6 cm on 12/18/2019 with scan cellularity ( 12/18/2019) ( Bethesda Category I), repeat FNA on 12/26/2019 revealed papillary carcinoma ( Bethesda category VI)   Approximately 10 yrs ago she was told during a physical that she had local neck swelling NO local neck symptoms Weight is stable  She has a pending referral to Dr. Harlow Mcclure  Denies constipation    No  FH of thyroid disease  Father passed with pancreatitic cancer     HISTORY:  Past Medical History:  Past Medical History:  Diagnosis Date  . Asthma    as a teenager  . Elevated blood pressure reading   . Headache   . History of hyperlipidemia   . Kidney stones   . 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 LITHOTRIPSY    . NASAL SEPTUM SURGERY  2007  . TONSILLECTOMY        Social History:  reports that she has never smoked. She has never used smokeless tobacco. She reports current alcohol use. She reports that she does not use drugs.  Family History: family history includes Cancer - Other in her paternal grandmother; Heart disease in her paternal grandfather; Hyperlipidemia in her maternal aunt and mother; Hypertension in her mother; Pancreatic cancer in her father; Skin cancer in her paternal grandfather.   HOME  MEDICATIONS: Allergies as of 01/08/2020      Reactions   Rocephin [ceftriaxone Sodium In Dextrose] Rash      Medication List       Accurate as of January 08, 2020 10:09 AM. If you have any questions, ask your nurse or doctor.        amLODipine 10 MG tablet Commonly known as: NORVASC TAKE 1 TABLET(10 MG) BY MOUTH DAILY   CALCIUM 1200 PO Take by mouth.   celecoxib 200 MG capsule Commonly known as: CELEBREX Take 1 capsule (200 mg total) by mouth daily. Use as needed for knee pain   CRANBERRY PO Take 1 tablet by mouth daily.   losartan 25 MG tablet Commonly known as: COZAAR Take 25 mg by mouth daily.   multivitamin tablet Take 1 tablet by mouth daily.   rosuvastatin 20 MG tablet Commonly known as: Crestor Take 1 tablet (20 mg total) by mouth daily.   TURMERIC PO Take 1 tablet by mouth daily.   VITAMIN C PO Take 1 tablet by mouth daily.   zolpidem 5 MG tablet Commonly known as: AMBIEN Take 0.5-1 tablets (2.5-5 mg total) by mouth at bedtime as needed for sleep.         REVIEW OF SYSTEMS: A comprehensive ROS was conducted with the patient and is negative except as per HPI    OBJECTIVE:  VS: BP 122/88  Pulse 81   Ht 5\' 6"  (1.676 m)   Wt 178 lb (80.7 kg)   LMP 09/04/2018 Comment: possible perimenopausal  SpO2 96%   BMI 28.73 kg/m    Wt Readings from Last 3 Encounters:  01/08/20 178 lb (80.7 kg)  01/02/20 175 lb (79.4 kg)  11/29/19 177 lb (80.3 kg)     EXAM: General: Pt appears well and is in NAD  Neck: General: Supple without adenopathy. Thyroid: Thyroid size normal.  Right thyroid nodule appreciated.   Lungs: Clear with good BS bilat with no rales, rhonchi, or wheezes  Heart: Auscultation: RRR.  Abdomen: Normoactive bowel sounds, soft, nontender, without masses or organomegaly palpable  Extremities:  BL LE: No pretibial edema normal ROM and strength.  Skin: Hair: Texture and amount normal with gender appropriate distribution Skin Inspection:  No rashes Skin Palpation: Skin temperature, texture, and thickness normal to palpation  Neuro: Cranial nerves: II - XII grossly intact  Motor: Normal strength throughout DTRs: 2+ and symmetric in UE without delay in relaxation phase  Mental Status: Judgment, insight: Intact Orientation: Oriented to time, place, and person Mood and affect: No depression, anxiety, or agitation     DATA REVIEWED:  Results for Lauren Mcclure, Lauren Mcclure (MRN [id number]) as of 01/09/2020 13:39  Ref. Range 01/08/2020 09:28  TSH Latest Units: mIU/L 0.80  T4,Free(Direct) Latest Ref Range: 0.8 - 1.8 ng/dL 1.1     Thyroid ULtrasound :12/07/2019  There is an approximately 0.7 x 0.6 x 0.4 cm minimally complex cyst within the superior pole of the right lobe of the thyroid (labeled 1), which does not meet criteria to recommend percutaneous sampling or continued dedicated follow-up.  There is an approximately 3.1 x 2.2 x 1.8 cm minimally complex cyst within the right lobe of the thyroid (labeled 2), which correlates with the dominant nodule seen on preceding chest CT, however does not meet criteria to recommend percutaneous sampling or continued dedicated follow-up.  _________________________________________________________  Nodule # 3:  Location: Left; Inferior-this nodule correlates with the questioned nodule seen within the left lobe of the thyroid on preceding chest CT  Maximum size: 1.6 cm; Other 2 dimensions: 1.1 x 0.9 cm  Composition: solid/almost completely solid (2)  Echogenicity: hypoechoic (2)  Shape: not taller-than-wide (0)  Margins: ill-defined (0)  Echogenic foci: macrocalcifications (1)  ACR TI-RADS total points: 5.  ACR TI-RADS risk category: TR4 (4-6 points).  ACR TI-RADS recommendations:  **Given size (>/= 1.5 cm) and appearance, fine needle aspiration of this moderately suspicious nodule should be considered based on TI-RADS  criteria.  _________________________________________________________  There is a punctate (approximately 0.7 cm) spongiform/benign-appearing nodule within the inferior pole the left lobe of the thyroid (labeled 4), which does not meet criteria to recommend percutaneous sampling or continued dedicated follow-up.  IMPRESSION: 1. Findings suggestive of multinodular goiter. 2. Nodule #3, correlating with the left-sided nodule seen on preceding chest CT, meets imaging criteria to recommend percutaneous sampling as indicated. 3. None of the remaining discretely measured thyroid nodules, including the dominant right-sided nodule seen on preceding chest CT, meet imaging criteria to recommend percutaneous sampling or continued dedicated follow-up.  CYTOLOGY - NON PAP  CASE: [id number]  PATIENT: Lauren Mcclure  Non-Gynecological Cytology Report    FNA 12/26/2019  Clinical History: Left; Inferior this nodule correlates with the  questioned nodule seen within the left lobe of the thyroid on preceding  chest ct, Maximum Size: 1.6 cm; Other 2 dimensions: 1.1 x 0.9 cm,  solid/almost completely solid (2), Hypoechoic (2),  ACR TI-RADS Total 5  Specimen Submitted: A. 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 Carcinoma determined on FNA of left thyroid nodule:   - Pt is clinically euthyroid  - No local neck symptoms  - She has a pending referral to general surgery - Pt will need to follow up with [place] 6 weeks post-op    Signed electronically by: Mack Guise, MD  [facility] Endocrinology  [facility] [address] [place], [place] [address] Phone: [phone] FAX: [phone]   CC: Darreld Mclean, [address] [place] [address] Phone: [phone] Fax:  [phone]   Return to Endocrinology clinic as below: Future Appointments  Date Time Provider [place]  01/26/2020 10:20 AM GI-[id number] MR 2 [id number] [id number]

## 2020-01-09 ENCOUNTER — Encounter: Payer: Self-pay | Admitting: Internal Medicine

## 2020-01-10 ENCOUNTER — Telehealth: Payer: Self-pay | Admitting: Family Medicine

## 2020-01-10 NOTE — Telephone Encounter (Signed)
Patient calling in reference to last message sent .  Patient states she has a question in reference to lab results of T4, patient states she has thyroid cancer.  And FMLA paperwork

## 2020-01-10 NOTE — Telephone Encounter (Signed)
Patient is requesting a return call from our office in reference to [place] paper work.

## 2020-01-10 NOTE — Telephone Encounter (Signed)
Patient requests to speak to PCP regarding lab work and what it means. She would like FMLA filled out for job-upcoming surgery. We do not have ppwk yet as far as I know.

## 2020-01-10 NOTE — Telephone Encounter (Signed)
She spoke to HR today- she needs to get FMLA and STD.   Her job is physically demanding and she is worried about covid due to her recent cancer dx. She would like to be out of work until she completes her cancer treatment and w/u for LFTS. She is seeing Dr Catalina Antigua on 10/19 She was sent home from work today as she was not feeling well She is getting her liver MRI on 10/21 I will watch for her STD and FMLA paperwork and am glad to complete for her.  Start leave today 10/7

## 2020-01-21 ENCOUNTER — Telehealth: Payer: Self-pay | Admitting: Family Medicine

## 2020-01-21 NOTE — Telephone Encounter (Signed)
Pt dropped of FMLA forms to be filled out by [place]  Forms put into bin up front  Pt would like to be called when papers are ready to be picked up

## 2020-01-22 NOTE — Telephone Encounter (Signed)
Filled out portion I could, placed in provider folder for signature.

## 2020-01-23 ENCOUNTER — Ambulatory Visit: Payer: Self-pay | Admitting: Surgery

## 2020-01-24 NOTE — Telephone Encounter (Signed)
Patient would like to know the status of form.

## 2020-01-24 NOTE — Telephone Encounter (Signed)
Patient aware forms up front to be picked up. She would like to ask what she can take for pain in her lymph nodes. She is having throat pain and swelling of her lymph nodes. It is keeping her up at night even with taking ambien. She is requesting guidance.

## 2020-01-24 NOTE — Telephone Encounter (Signed)
Called her back- she has noted discomfort with swallowing and tender nodes in her neck It has been quite sore for about a week She tried tylenol, drinking warm water, etc but it does not seem to help No fever, she does not feel ill in general   She has some celebrex on hand that she will try.  If this does not help she will let me know and we can try tylenol #3 or similar

## 2020-01-26 ENCOUNTER — Ambulatory Visit
Admission: RE | Admit: 2020-01-26 | Discharge: 2020-01-26 | Disposition: A | Discharge status: Home / Self Care | Payer: BC Managed Care – PPO | Source: Ambulatory Visit | Attending: Family Medicine | Admitting: Family Medicine

## 2020-01-26 ENCOUNTER — Other Ambulatory Visit: Payer: Self-pay

## 2020-01-26 DIAGNOSIS — K769 Liver disease, unspecified: Secondary | ICD-10-CM

## 2020-01-26 MED ORDER — GADOBENATE DIMEGLUMINE 529 MG/ML IV SOLN
15.0000 mL | Freq: Once | INTRAVENOUS | Status: AC | PRN
  Administered 2020-01-26: 15 mL via INTRAVENOUS

## 2020-01-28 ENCOUNTER — Telehealth: Payer: Self-pay | Admitting: Family Medicine

## 2020-01-28 DIAGNOSIS — K769 Liver disease, unspecified: Secondary | ICD-10-CM

## 2020-01-28 DIAGNOSIS — R7401 Elevation of levels of liver transaminase levels: Secondary | ICD-10-CM

## 2020-01-28 DIAGNOSIS — R195 Other fecal abnormalities: Secondary | ICD-10-CM

## 2020-01-28 NOTE — Progress Notes (Signed)
DUE TO COVID-19 ONLY ONE VISITOR IS ALLOWED TO COME WITH YOU AND STAY IN THE WAITING ROOM ONLY DURING PRE OP AND PROCEDURE DAY OF SURGERY. THE 1 VISITOR  MAY VISIT WITH YOU AFTER SURGERY IN YOUR PRIVATE ROOM DURING VISITING HOURS ONLY!  YOU NEED TO HAVE A COVID 19 TEST ON___10/30/21 ____ @_1105am______ , THIS TEST MUST BE DONE BEFORE SURGERY,  COVID TESTING SITE [address] [place] [id number], IT IS ON THE RIGHT GOING OUT [address] APPROXIMATELY  2 MINUTES PAST [employer] ON THE RIGHT. ONCE YOUR COVID TEST IS COMPLETED,  PLEASE BEGIN THE QUARANTINE INSTRUCTIONS AS OUTLINED IN YOUR HANDOUT.                Lauren Mcclure  01/28/2020   Your procedure is scheduled on: 02/05/2020    Report to [facility] Main  Entrance   Report to admitting at    [place] AM     Call this number if you have problems the morning of surgery [phone]    REMEMBER: NO  SOLID FOOD CANDY OR GUM AFTER MIDNIGHT. CLEAR LIQUIDS UNTIL   0715am        . NOTHING BY MOUTH EXCEPT CLEAR LIQUIDS UNTIL    . PLEASE FINISH ENSURE DRINK PER SURGEON ORDER  WHICH NEEDS TO BE COMPLETED AT   0715am   .      CLEAR LIQUID DIET   Foods Allowed                                                                    Coffee and tea, regular and decaf                            Fruit ices (not with fruit pulp)                                      Iced Popsicles                                    Carbonated beverages, regular and diet                                    Cranberry, grape and apple juices Sports drinks like Gatorade Lightly seasoned clear broth or consume(fat free) Sugar, honey syrup ___________________________________________________________________      BRUSH YOUR TEETH MORNING OF SURGERY AND RINSE YOUR MOUTH OUT, NO CHEWING GUM CANDY OR MINTS.     Take these medicines the morning of surgery with A SIP OF WATER: amlodipine   DO NOT TAKE ANY DIABETIC MEDICATIONS DAY OF YOUR  SURGERY                               You may not have any metal on your body including hair pins and              piercings  Do not wear  jewelry, make-up, lotions, powders or perfumes, deodorant             Do not wear nail polish on your fingernails.  Do not shave  48 hours prior to surgery.              Men may shave face and neck.   Do not bring valuables to the hospital. [place].  Contacts, dentures or bridgework may not be worn into surgery.  Leave suitcase in the car. After surgery it may be brought to your room.     Patients discharged the day of surgery will not be allowed to drive home. IF YOU ARE HAVING SURGERY AND GOING HOME THE SAME DAY, YOU MUST HAVE AN ADULT TO DRIVE YOU HOME AND BE WITH YOU FOR 24 HOURS. YOU MAY GO HOME BY TAXI OR UBER OR ORTHERWISE, BUT AN ADULT MUST ACCOMPANY YOU HOME AND STAY WITH YOU FOR 24 HOURS.  Name and phone number of your driver:  Special Instructions: N/A              Please read over the following fact sheets you were given: _____________________________________________________________________  [facility] - Preparing for Surgery Before surgery, you can play an important role.  Because skin is not sterile, your skin needs to be as free of germs as possible.  You can reduce the number of germs on your skin by washing with CHG (chlorahexidine gluconate) soap before surgery.  CHG is an antiseptic cleaner which kills germs and bonds with the skin to continue killing germs even after washing. Please DO NOT use if you have an allergy to CHG or antibacterial soaps.  If your skin becomes reddened/irritated stop using the CHG and inform your nurse when you arrive at Short Stay. Do not shave (including legs and underarms) for at least 48 hours prior to the first CHG shower.  You may shave your face/neck. Please follow these instructions carefully:  1.  Shower with CHG Soap the night before surgery and the   morning of Surgery.  2.  If you choose to wash your hair, wash your hair first as usual with your  normal  shampoo.  3.  After you shampoo, rinse your hair and body thoroughly to remove the  shampoo.                           4.  Use CHG as you would any other liquid soap.  You can apply chg directly  to the skin and wash                       Gently with a scrungie or clean washcloth.  5.  Apply the CHG Soap to your body ONLY FROM THE NECK DOWN.   Do not use on face/ open                           Wound or open sores. Avoid contact with eyes, ears mouth and genitals (private parts).                       Wash face,  Genitals (private parts) with your normal soap.             6.  Wash thoroughly, paying special  attention to the area where your surgery  will be performed.  7.  Thoroughly rinse your body with warm water from the neck down.  8.  DO NOT shower/wash with your normal soap after using and rinsing off  the CHG Soap.                9.  Pat yourself dry with a clean towel.            10.  Wear clean pajamas.            11.  Place clean sheets on your bed the night of your first shower and do not  sleep with pets. Day of Surgery : Do not apply any lotions/deodorants the morning of surgery.  Please wear clean clothes to the hospital/surgery center.  FAILURE TO FOLLOW THESE INSTRUCTIONS MAY RESULT IN THE CANCELLATION OF YOUR SURGERY PATIENT SIGNATURE_________________________________  NURSE SIGNATURE__________________________________  ________________________________________________________________________

## 2020-01-28 NOTE — Telephone Encounter (Signed)
Called pt regarding her recent liver MRI It looks like steatosis is probably why her LFTs are high.  She also still needs to follow-up her + cologaurd- with everything going on she did not contact GI yet.  I will place a new referral for her now  Thyroid surgery date 11/2

## 2020-01-29 ENCOUNTER — Encounter (HOSPITAL_COMMUNITY)
Admission: RE | Admit: 2020-01-29 | Discharge: 2020-01-29 | Disposition: A | Discharge status: Home / Self Care | Payer: BC Managed Care – PPO | Source: Ambulatory Visit | Attending: Surgery | Admitting: Surgery

## 2020-01-29 ENCOUNTER — Other Ambulatory Visit: Payer: Self-pay

## 2020-01-29 ENCOUNTER — Encounter (HOSPITAL_COMMUNITY): Payer: Self-pay

## 2020-01-29 ENCOUNTER — Ambulatory Visit (HOSPITAL_COMMUNITY)
Admission: RE | Admit: 2020-01-29 | Discharge: 2020-01-29 | Disposition: A | Discharge status: Home / Self Care | Payer: BC Managed Care – PPO | Source: Ambulatory Visit | Attending: Anesthesiology | Admitting: Anesthesiology

## 2020-01-29 DIAGNOSIS — Z01810 Encounter for preprocedural cardiovascular examination: Secondary | ICD-10-CM | POA: Diagnosis present

## 2020-01-29 HISTORY — DX: Personal history of urinary calculi: Z87.442

## 2020-01-29 HISTORY — DX: Anxiety disorder, unspecified: F41.9

## 2020-01-29 HISTORY — DX: Essential (primary) hypertension: I10

## 2020-01-29 HISTORY — DX: Unspecified osteoarthritis, unspecified site: M19.90

## 2020-01-29 LAB — BASIC METABOLIC PANEL
Anion gap: 13 (ref 5–15)
BUN: 11 mg/dL (ref 6–20)
CO2: 25 mmol/L (ref 22–32)
Calcium: 10 mg/dL (ref 8.9–10.3)
Chloride: 104 mmol/L (ref 98–111)
Creatinine, Ser: 0.82 mg/dL (ref 0.44–1.00)
GFR, Estimated: >60 mL/min (ref >60)
Glucose, Bld: 119 mg/dL — ABNORMAL HIGH (ref 70–99)
Potassium: 3.6 mmol/L (ref 3.5–5.1)
Sodium: 142 mmol/L (ref 135–145)

## 2020-01-29 LAB — CBC
HCT: 48.2 % — ABNORMAL HIGH (ref 36.0–46.0)
Hemoglobin: 16.6 g/dL — ABNORMAL HIGH (ref 12.0–15.0)
MCH: 28.6 pg (ref 26.0–34.0)
MCHC: 34.4 g/dL (ref 30.0–36.0)
MCV: 83 fL (ref 80.0–100.0)
Platelets: 268 10*3/uL (ref 150–400)
RBC: 5.81 MIL/uL — ABNORMAL HIGH (ref 3.87–5.11)
RDW: 12.7 % (ref 11.5–15.5)
WBC: 8.8 10*3/uL (ref 4.0–10.5)
nRBC: 0 % (ref 0.0–0.2)

## 2020-02-02 ENCOUNTER — Other Ambulatory Visit (HOSPITAL_COMMUNITY)
Admission: RE | Admit: 2020-02-02 | Discharge: 2020-02-02 | Disposition: A | Discharge status: Home / Self Care | Payer: BC Managed Care – PPO | Source: Ambulatory Visit | Attending: Surgery | Admitting: Surgery

## 2020-02-02 DIAGNOSIS — Z01812 Encounter for preprocedural laboratory examination: Secondary | ICD-10-CM | POA: Insufficient documentation

## 2020-02-02 DIAGNOSIS — Z20822 Contact with and (suspected) exposure to covid-19: Secondary | ICD-10-CM | POA: Insufficient documentation

## 2020-02-03 ENCOUNTER — Encounter (HOSPITAL_COMMUNITY): Payer: Self-pay | Admitting: Surgery

## 2020-02-03 DIAGNOSIS — E041 Nontoxic single thyroid nodule: Secondary | ICD-10-CM | POA: Diagnosis present

## 2020-02-03 DIAGNOSIS — C73 Malignant neoplasm of thyroid gland: Secondary | ICD-10-CM | POA: Diagnosis present

## 2020-02-03 LAB — SARS CORONAVIRUS 2 (TAT 6-24 HRS): SARS Coronavirus 2: NEGATIVE

## 2020-02-03 NOTE — H&P (Signed)
General Surgery [facility]  Lauren Mcclure DOB: 07-Dec-1970 Married / Language: English / Race: Asian Female   History of Present Illness  The patient is a 49 year old female who presents with thyroid cancer.  CHIEF COMPLAINT: papillary thyroid carcinoma  Patient is referred by Dr. Janett Billow Copland for surgical evaluation and management of newly diagnosed papillary thyroid carcinoma. Patient's endocrinologist is Dr. Vivia Ewing. Patient has a long-standing history of thyroid disease dating back approximately 10 years when she underwent an employment physical for the [employer]. It is noted to have a right-sided thyroid mass. Recently the patient had undergone a CT scan of the chest. Incidental finding was made of a left-sided thyroid nodule. Patient underwent ultrasound examination on December 08, 2019. This showed a dominant cystic lesions in the right thyroid lobe with the largest measuring 3.1 cm. However there was a 1.6 cm nodule in the inferior left thyroid lobe which was felt to be moderately suspicious by ultrasound criteria and biopsy was recommended. Patient underwent fine-needle aspiration biopsy on December 18, 2019. This returned as inadequate for evaluation. Patient subsequently underwent a repeat biopsy on December 26, 2019. This demonstrated findings consistent with papillary thyroid carcinoma, Bethesda category VI. Patient has had no prior history of head or neck surgery. There is no family history of thyroid cancer or other endocrine neoplasm. Patient has never been on thyroid medication. She presents today for surgical assessment and recommendations for management.   Past Surgical History  Tonsillectomy   Diagnostic Studies History  Colonoscopy  never Mammogram  within last year  Allergies Rocephin *CEPHALOSPORINS*  Allergies Reconciled   Medication History Zolpidem Tartrate (5MG  Tablet, Oral) Active. Losartan  Potassium (25MG  Tablet, Oral) Active. Celecoxib (200MG  Capsule, Oral) Active. Rosuvastatin Calcium (20MG  Tablet, Oral) Active. amLODIPine Besylate (10MG  Tablet, Oral) Active. Losartan Potassium (50MG  Tablet, Oral) Active. Medications Reconciled  Social History  Alcohol use  Occasional alcohol use. Caffeine use  Coffee. No drug use  Tobacco use  Never smoker.  Family History  Arthritis  Mother. Bleeding disorder  Mother. Hypertension  Mother. Malignant Neoplasm Of Pancreas  Father.  Pregnancy / Birth History  Age at menarche  27 years. Age of menopause  12-50 Gravida  0 Para  0  Other Problems  High blood pressure  Kidney Stone  Thyroid Cancer   Review of Systems General Present- Weight Gain. Not Present- Appetite Loss, Chills, Fatigue, Fever, Night Sweats and Weight Loss. Skin Present- Change in Wart/Mole. Not Present- Dryness, Hives, Jaundice, New Lesions, Non-Healing Wounds, Rash and Ulcer. HEENT Present- Wears glasses/contact lenses. Not Present- Earache, Hearing Loss, Hoarseness, Nose Bleed, Oral Ulcers, Ringing in the Ears, Seasonal Allergies, Sinus Pain, Sore Throat, Visual Disturbances and Yellow Eyes. Gastrointestinal Present- Bloating. Not Present- Abdominal Pain, Bloody Stool, Change in Bowel Habits, Chronic diarrhea, Constipation, Difficulty Swallowing, Excessive gas, Gets full quickly at meals, Hemorrhoids, Indigestion, Nausea, Rectal Pain and Vomiting. Female Genitourinary Not Present- Frequency, Nocturia, Painful Urination, Pelvic Pain and Urgency. Musculoskeletal Present- Joint Pain. Not Present- Back Pain, Joint Stiffness, Muscle Pain, Muscle Weakness and Swelling of Extremities. Neurological Present- Headaches. Not Present- Decreased Memory, Fainting, Numbness, Seizures, Tingling, Tremor, Trouble walking and Weakness. Psychiatric Present- Anxiety. Not Present- Bipolar, Change in Sleep Pattern, Depression, Fearful and Frequent  crying. Endocrine Present- Hot flashes. Not Present- Cold Intolerance, Excessive Hunger, Hair Changes, Heat Intolerance and New Diabetes.  Vitals  Weight: 179.25 lb Height: 66in Body Surface Area: 1.91 m Body Mass Index: 28.93 kg/m  Temp.:  98.38F  Pulse: 93 (Regular)  P.OX: 97% (Room air) BP: 120/78(Sitting, Left Arm, Standard)  Physical Exam   GENERAL APPEARANCE Development: normal Nutritional status: normal Gross deformities: none  SKIN Rash, lesions, ulcers: none Induration, erythema: none Nodules: none palpable  EYES Conjunctiva and lids: normal Pupils: equal and reactive Iris: normal bilaterally  EARS, NOSE, MOUTH, THROAT External ears: no lesion or deformity External nose: no lesion or deformity Hearing: grossly normal Due to Covid-19 pandemic, patient is wearing a mask.  NECK Symmetric: no Trachea: midline Thyroid: Palpation of the right thyroid lobe shows a dominant smooth relatively firm mass located inferiorly and anteriorly. This coincides with the cyst seen on recent ultrasound. Palpation of the left thyroid lobe shows no dominant or discrete masses to palpation. There is no associated lymphadenopathy.  CHEST Respiratory effort: normal Retraction or accessory muscle use: no Breath sounds: normal bilaterally Rales, rhonchi, wheeze: none  CARDIOVASCULAR Auscultation: regular rhythm, normal rate Murmurs: none Pulses: radial pulse 2+ palpable Lower extremity edema: none  MUSCULOSKELETAL Station and gait: normal Digits and nails: no clubbing or cyanosis Muscle strength: grossly normal all extremities Range of motion: grossly normal all extremities Deformity: none  LYMPHATIC Cervical: none palpable Supraclavicular: none palpable  PSYCHIATRIC Oriented to person, place, and time: yes Mood and affect: normal for situation Judgment and insight: appropriate for situation    Assessment & Plan   PAPILLARY THYROID CARCINOMA  (C73) THYROID CYST (E04.1)  Patient is referred by her primary care physician and her endocrinologist for surgical evaluation and management of newly diagnosed papillary thyroid carcinoma.  Patient provided with a copy of "The Thyroid Book: Medical and Surgical Treatment of Thyroid Problems", published by [employer], 16 pages. Book reviewed and explained to patient during visit today.  Patient has a 1.6 cm papillary thyroid cancer in the left thyroid lobe. There are at least 2 cyst present within the right thyroid lobe, the largest measuring 3.1 cm in size. I have recommended proceeding with total thyroidectomy in order to treat the underlying papillary thyroid carcinoma and to remove the dominant cystic mass from the right thyroid lobe. We discussed the risk and benefits of total thyroidectomy. We discussed the risk of recurrent laryngeal nerve injury and injury to parathyroid glands. We discussed the need for lifelong thyroid hormone replacement. We discussed the potential need for radioactive iodine treatment. We discussed the size and location of the surgical incision. We discussed the hospital stay to be anticipated. The patient understands and wishes to proceed with surgery in the near future.  The risks and benefits of the procedure have been discussed at length with the patient. The patient understands the proposed procedure, potential alternative treatments, and the course of recovery to be expected. All of the patient's questions have been answered at this time. The patient wishes to proceed with surgery.  Armandina Gemma, MD [facility] Office: [phone]

## 2020-02-05 ENCOUNTER — Ambulatory Visit (HOSPITAL_COMMUNITY): Payer: BC Managed Care – PPO | Admitting: Emergency Medicine

## 2020-02-05 ENCOUNTER — Encounter (HOSPITAL_COMMUNITY): Payer: Self-pay | Admitting: Surgery

## 2020-02-05 ENCOUNTER — Encounter (HOSPITAL_COMMUNITY)
Admission: RE | Disposition: A | Discharge status: Home / Self Care | Payer: Self-pay | Source: Ambulatory Visit | Attending: Surgery

## 2020-02-05 ENCOUNTER — Other Ambulatory Visit: Payer: Self-pay

## 2020-02-05 ENCOUNTER — Ambulatory Visit (HOSPITAL_COMMUNITY)
Admission: RE | Admit: 2020-02-05 | Discharge: 2020-02-06 | Disposition: A | Discharge status: Home / Self Care | Payer: BC Managed Care – PPO | Source: Ambulatory Visit | Attending: Surgery | Admitting: Surgery

## 2020-02-05 ENCOUNTER — Ambulatory Visit (HOSPITAL_COMMUNITY): Payer: BC Managed Care – PPO | Admitting: Anesthesiology

## 2020-02-05 DIAGNOSIS — Z832 Family history of diseases of the blood and blood-forming organs and certain disorders involving the immune mechanism: Secondary | ICD-10-CM | POA: Insufficient documentation

## 2020-02-05 DIAGNOSIS — Z8249 Family history of ischemic heart disease and other diseases of the circulatory system: Secondary | ICD-10-CM | POA: Diagnosis not present

## 2020-02-05 DIAGNOSIS — I10 Essential (primary) hypertension: Secondary | ICD-10-CM | POA: Diagnosis not present

## 2020-02-05 DIAGNOSIS — E041 Nontoxic single thyroid nodule: Secondary | ICD-10-CM | POA: Insufficient documentation

## 2020-02-05 DIAGNOSIS — Z881 Allergy status to other antibiotic agents status: Secondary | ICD-10-CM | POA: Insufficient documentation

## 2020-02-05 DIAGNOSIS — Z79899 Other long term (current) drug therapy: Secondary | ICD-10-CM | POA: Insufficient documentation

## 2020-02-05 DIAGNOSIS — Z8261 Family history of arthritis: Secondary | ICD-10-CM | POA: Insufficient documentation

## 2020-02-05 DIAGNOSIS — Z800 Family history of malignant neoplasm of digestive organs: Secondary | ICD-10-CM | POA: Insufficient documentation

## 2020-02-05 DIAGNOSIS — C73 Malignant neoplasm of thyroid gland: Secondary | ICD-10-CM | POA: Diagnosis present

## 2020-02-05 HISTORY — PX: THYROIDECTOMY: SHX17

## 2020-02-05 SURGERY — THYROIDECTOMY
Anesthesia: General

## 2020-02-05 MED ORDER — SUGAMMADEX SODIUM 500 MG/5ML IV SOLN
INTRAVENOUS | Status: AC
  Filled 2020-02-05: qty 5

## 2020-02-05 MED ORDER — FENTANYL CITRATE (PF) 250 MCG/5ML IJ SOLN
INTRAMUSCULAR | Status: AC
  Filled 2020-02-05: qty 5

## 2020-02-05 MED ORDER — ACETAMINOPHEN 650 MG RE SUPP: 650.0000 mg | Freq: Four times a day (QID) | RECTAL | Status: DC | PRN

## 2020-02-05 MED ORDER — HYDROMORPHONE HCL 1 MG/ML IJ SOLN
1.0000 mg | INTRAMUSCULAR | Status: DC | PRN
  Administered 2020-02-05: 1 mg via INTRAVENOUS
  Filled 2020-02-05: qty 1

## 2020-02-05 MED ORDER — MEPERIDINE HCL 50 MG/ML IJ SOLN: 6.2500 mg | INTRAMUSCULAR | Status: DC | PRN

## 2020-02-05 MED ORDER — PHENYLEPHRINE 40 MCG/ML (10ML) SYRINGE FOR IV PUSH (FOR BLOOD PRESSURE SUPPORT)
PREFILLED_SYRINGE | INTRAVENOUS | Status: AC
  Filled 2020-02-05: qty 20

## 2020-02-05 MED ORDER — KETOROLAC TROMETHAMINE 30 MG/ML IJ SOLN
30.0000 mg | Freq: Once | INTRAMUSCULAR | Status: AC | PRN
  Administered 2020-02-05: 30 mg via INTRAVENOUS

## 2020-02-05 MED ORDER — 0.9 % SODIUM CHLORIDE (POUR BTL) OPTIME
TOPICAL | Status: DC | PRN
  Administered 2020-02-05: 1000 mL

## 2020-02-05 MED ORDER — ONDANSETRON HCL 4 MG/2ML IJ SOLN
INTRAMUSCULAR | Status: DC | PRN
  Administered 2020-02-05: 4 mg via INTRAVENOUS

## 2020-02-05 MED ORDER — PROPOFOL 10 MG/ML IV BOLUS
INTRAVENOUS | Status: AC
  Filled 2020-02-05: qty 20

## 2020-02-05 MED ORDER — HYDROMORPHONE HCL 1 MG/ML IJ SOLN
0.2500 mg | INTRAMUSCULAR | Status: DC | PRN
  Administered 2020-02-05 (×2): 0.25 mg via INTRAVENOUS
  Administered 2020-02-05: 0.5 mg via INTRAVENOUS

## 2020-02-05 MED ORDER — CHLORHEXIDINE GLUCONATE 0.12 % MT SOLN
15.0000 mL | Freq: Once | OROMUCOSAL | Status: AC
  Administered 2020-02-05: 15 mL via OROMUCOSAL

## 2020-02-05 MED ORDER — CHLORHEXIDINE GLUCONATE CLOTH 2 % EX PADS: 6.0000 | MEDICATED_PAD | Freq: Once | CUTANEOUS | Status: DC

## 2020-02-05 MED ORDER — TRAMADOL HCL 50 MG PO TABS: 50.0000 mg | ORAL_TABLET | Freq: Four times a day (QID) | ORAL | Status: DC | PRN

## 2020-02-05 MED ORDER — AMLODIPINE BESYLATE 10 MG PO TABS
10.0000 mg | ORAL_TABLET | Freq: Every day | ORAL | Status: DC
  Administered 2020-02-06: 10 mg via ORAL
  Filled 2020-02-05: qty 1

## 2020-02-05 MED ORDER — ACETAMINOPHEN 325 MG PO TABS
650.0000 mg | ORAL_TABLET | Freq: Four times a day (QID) | ORAL | Status: DC | PRN
  Administered 2020-02-05: 650 mg via ORAL
  Filled 2020-02-05: qty 2

## 2020-02-05 MED ORDER — SCOPOLAMINE 1 MG/3DAYS TD PT72
1.0000 | MEDICATED_PATCH | TRANSDERMAL | Status: DC
  Administered 2020-02-05: 1.5 mg via TRANSDERMAL
  Filled 2020-02-05: qty 1

## 2020-02-05 MED ORDER — SUCCINYLCHOLINE CHLORIDE 200 MG/10ML IV SOSY
PREFILLED_SYRINGE | INTRAVENOUS | Status: AC
  Filled 2020-02-05: qty 30

## 2020-02-05 MED ORDER — DEXAMETHASONE SODIUM PHOSPHATE 10 MG/ML IJ SOLN
INTRAMUSCULAR | Status: DC | PRN
  Administered 2020-02-05: 10 mg via INTRAVENOUS

## 2020-02-05 MED ORDER — ORAL CARE MOUTH RINSE: 15.0000 mL | Freq: Once | OROMUCOSAL | Status: AC

## 2020-02-05 MED ORDER — ROCURONIUM BROMIDE 10 MG/ML (PF) SYRINGE
PREFILLED_SYRINGE | INTRAVENOUS | Status: DC | PRN
  Administered 2020-02-05: 10 mg via INTRAVENOUS
  Administered 2020-02-05: 60 mg via INTRAVENOUS

## 2020-02-05 MED ORDER — HYDROMORPHONE HCL 1 MG/ML IJ SOLN
INTRAMUSCULAR | Status: AC
  Filled 2020-02-05: qty 1

## 2020-02-05 MED ORDER — LIDOCAINE 2% (20 MG/ML) 5 ML SYRINGE
INTRAMUSCULAR | Status: AC
  Filled 2020-02-05: qty 10

## 2020-02-05 MED ORDER — PROMETHAZINE HCL 25 MG/ML IJ SOLN: 6.2500 mg | INTRAMUSCULAR | Status: DC | PRN

## 2020-02-05 MED ORDER — FENTANYL CITRATE (PF) 100 MCG/2ML IJ SOLN
INTRAMUSCULAR | Status: AC
  Filled 2020-02-05: qty 2

## 2020-02-05 MED ORDER — ACETAMINOPHEN 500 MG PO TABS
1000.0000 mg | ORAL_TABLET | Freq: Once | ORAL | Status: AC
  Administered 2020-02-05: 1000 mg via ORAL
  Filled 2020-02-05: qty 2

## 2020-02-05 MED ORDER — MIDAZOLAM HCL 2 MG/2ML IJ SOLN
INTRAMUSCULAR | Status: DC | PRN
  Administered 2020-02-05: 2 mg via INTRAVENOUS

## 2020-02-05 MED ORDER — LOSARTAN POTASSIUM 25 MG PO TABS
25.0000 mg | ORAL_TABLET | Freq: Every evening | ORAL | Status: DC
  Administered 2020-02-05: 25 mg via ORAL
  Filled 2020-02-05: qty 1

## 2020-02-05 MED ORDER — PROPOFOL 10 MG/ML IV BOLUS
INTRAVENOUS | Status: DC | PRN
  Administered 2020-02-05: 150 mg via INTRAVENOUS

## 2020-02-05 MED ORDER — MENTHOL 3 MG MT LOZG
1.0000 | LOZENGE | OROMUCOSAL | Status: DC | PRN
  Filled 2020-02-05: qty 9

## 2020-02-05 MED ORDER — FENTANYL CITRATE (PF) 250 MCG/5ML IJ SOLN
INTRAMUSCULAR | Status: DC | PRN
  Administered 2020-02-05 (×4): 50 ug via INTRAVENOUS
  Administered 2020-02-05 (×2): 25 ug via INTRAVENOUS
  Administered 2020-02-05: 50 ug via INTRAVENOUS

## 2020-02-05 MED ORDER — DEXAMETHASONE SODIUM PHOSPHATE 10 MG/ML IJ SOLN
INTRAMUSCULAR | Status: AC
  Filled 2020-02-05: qty 2

## 2020-02-05 MED ORDER — ONDANSETRON HCL 4 MG/2ML IJ SOLN
INTRAMUSCULAR | Status: AC
  Filled 2020-02-05: qty 4

## 2020-02-05 MED ORDER — VANCOMYCIN HCL IN DEXTROSE 1-5 GM/200ML-% IV SOLN
1000.0000 mg | Freq: Once | INTRAVENOUS | Status: AC
  Administered 2020-02-05: 1000 mg via INTRAVENOUS
  Filled 2020-02-05: qty 200

## 2020-02-05 MED ORDER — LACTATED RINGERS IV SOLN: INTRAVENOUS | Status: DC

## 2020-02-05 MED ORDER — ONDANSETRON HCL 4 MG/2ML IJ SOLN: 4.0000 mg | Freq: Four times a day (QID) | INTRAMUSCULAR | Status: DC | PRN

## 2020-02-05 MED ORDER — PHENYLEPHRINE HCL (PRESSORS) 10 MG/ML IV SOLN
INTRAVENOUS | Status: AC
  Filled 2020-02-05: qty 2

## 2020-02-05 MED ORDER — PHENYLEPHRINE HCL-NACL 10-0.9 MG/250ML-% IV SOLN
INTRAVENOUS | Status: DC | PRN
  Administered 2020-02-05: 10 ug/min via INTRAVENOUS
  Administered 2020-02-05: 80 ug/min via INTRAVENOUS

## 2020-02-05 MED ORDER — SUGAMMADEX SODIUM 200 MG/2ML IV SOLN
INTRAVENOUS | Status: DC | PRN
  Administered 2020-02-05: 200 mg via INTRAVENOUS
  Administered 2020-02-05: 100 mg via INTRAVENOUS

## 2020-02-05 MED ORDER — ESMOLOL HCL 100 MG/10ML IV SOLN
INTRAVENOUS | Status: AC
  Filled 2020-02-05: qty 10

## 2020-02-05 MED ORDER — FENTANYL CITRATE (PF) 100 MCG/2ML IJ SOLN
INTRAMUSCULAR | Status: DC | PRN
Start: 2020-02-05 — End: 2020-02-05
  Administered 2020-02-05: 50 ug via INTRAVENOUS

## 2020-02-05 MED ORDER — MIDAZOLAM HCL 2 MG/2ML IJ SOLN
INTRAMUSCULAR | Status: AC
  Filled 2020-02-05: qty 2

## 2020-02-05 MED ORDER — SODIUM CHLORIDE 0.45 % IV SOLN: INTRAVENOUS | Status: DC

## 2020-02-05 MED ORDER — OXYCODONE HCL 5 MG PO TABS: 5.0000 mg | ORAL_TABLET | Freq: Once | ORAL | Status: DC | PRN

## 2020-02-05 MED ORDER — LIDOCAINE 2% (20 MG/ML) 5 ML SYRINGE
INTRAMUSCULAR | Status: DC | PRN
  Administered 2020-02-05: 60 mg via INTRAVENOUS

## 2020-02-05 MED ORDER — OXYCODONE HCL 5 MG PO TABS
5.0000 mg | ORAL_TABLET | ORAL | Status: DC | PRN
  Administered 2020-02-05 – 2020-02-06 (×3): 10 mg via ORAL
  Filled 2020-02-05 (×3): qty 2

## 2020-02-05 MED ORDER — PHENYLEPHRINE HCL (PRESSORS) 10 MG/ML IV SOLN
INTRAVENOUS | Status: DC | PRN
  Administered 2020-02-05: 40 ug via INTRAVENOUS

## 2020-02-05 MED ORDER — KETOROLAC TROMETHAMINE 30 MG/ML IJ SOLN
INTRAMUSCULAR | Status: AC
  Filled 2020-02-05: qty 1

## 2020-02-05 MED ORDER — CALCIUM CARBONATE 1250 (500 CA) MG PO TABS
2.0000 | ORAL_TABLET | Freq: Three times a day (TID) | ORAL | Status: DC
  Administered 2020-02-05 – 2020-02-06 (×2): 1000 mg via ORAL
  Filled 2020-02-05 (×3): qty 1

## 2020-02-05 MED ORDER — OXYCODONE HCL 5 MG/5ML PO SOLN: 5.0000 mg | Freq: Once | ORAL | Status: DC | PRN

## 2020-02-05 MED ORDER — EPHEDRINE 5 MG/ML INJ
INTRAVENOUS | Status: AC
  Filled 2020-02-05: qty 20

## 2020-02-05 MED ORDER — ONDANSETRON 4 MG PO TBDP: 4.0000 mg | ORAL_TABLET | Freq: Four times a day (QID) | ORAL | Status: DC | PRN

## 2020-02-05 MED ORDER — ROCURONIUM BROMIDE 10 MG/ML (PF) SYRINGE
PREFILLED_SYRINGE | INTRAVENOUS | Status: AC
  Filled 2020-02-05: qty 20

## 2020-02-05 SURGICAL SUPPLY — 28 items
ATTRACTOMAT 16X20 MAGNETIC DRP (DRAPES) ×2 IMPLANT
BLADE SURG 15 STRL LF DISP TIS (BLADE) ×1 IMPLANT
BLADE SURG 15 STRL SS (BLADE) ×1
CHLORAPREP W/TINT 26 (MISCELLANEOUS) ×2 IMPLANT
CLIP VESOCCLUDE MED 6/CT (CLIP) ×6 IMPLANT
CLIP VESOCCLUDE SM WIDE 6/CT (CLIP) ×4 IMPLANT
COVER SURGICAL LIGHT HANDLE (MISCELLANEOUS) ×2 IMPLANT
COVER WAND RF STERILE (DRAPES) ×2 IMPLANT
DERMABOND ADVANCED (GAUZE/BANDAGES/DRESSINGS) ×1
DERMABOND ADVANCED .7 DNX12 (GAUZE/BANDAGES/DRESSINGS) ×1 IMPLANT
DRAPE LAPAROTOMY T 98X78 PEDS (DRAPES) ×2 IMPLANT
ELECT PENCIL ROCKER SW 15FT (MISCELLANEOUS) ×2 IMPLANT
ELECT REM PT RETURN 15FT ADLT (MISCELLANEOUS) ×2 IMPLANT
GAUZE 4X4 16PLY RFD (DISPOSABLE) ×2 IMPLANT
GLOVE SURG ORTHO 8.0 STRL STRW (GLOVE) ×2 IMPLANT
GOWN STRL REUS W/TWL XL LVL3 (GOWN DISPOSABLE) ×4 IMPLANT
HEMOSTAT SURGICEL 2X4 FIBR (HEMOSTASIS) ×2 IMPLANT
ILLUMINATOR WAVEGUIDE N/F (MISCELLANEOUS) ×2 IMPLANT
KIT BASIN OR (CUSTOM PROCEDURE TRAY) ×2 IMPLANT
KIT TURNOVER KIT A (KITS) IMPLANT
PACK BASIC VI WITH GOWN DISP (CUSTOM PROCEDURE TRAY) ×2 IMPLANT
SHEARS HARMONIC 9CM CVD (BLADE) ×2 IMPLANT
SUT MNCRL AB 4-0 PS2 18 (SUTURE) ×2 IMPLANT
SUT VIC AB 3-0 SH 18 (SUTURE) ×4 IMPLANT
SYR BULB IRRIG 60ML STRL (SYRINGE) ×2 IMPLANT
TOWEL OR 17X26 10 PK STRL BLUE (TOWEL DISPOSABLE) ×2 IMPLANT
TOWEL OR NON WOVEN STRL DISP B (DISPOSABLE) ×2 IMPLANT
TUBING CONNECTING 10 (TUBING) ×2 IMPLANT

## 2020-02-05 NOTE — Anesthesia Postprocedure Evaluation (Signed)
Anesthesia Post Note  Patient: Lauren Mcclure  Procedure(s) Performed: TOTAL THYROIDECTOMY (N/A )     Patient location during evaluation: PACU Anesthesia Type: General Level of consciousness: awake and alert, oriented and patient cooperative Pain management: pain level controlled Vital Signs Assessment: post-procedure vital signs reviewed and stable Respiratory status: spontaneous breathing, nonlabored ventilation and respiratory function stable Cardiovascular status: blood pressure returned to baseline and stable Postop Assessment: no apparent nausea or vomiting Anesthetic complications: no   No complications documented.  Last Vitals:  Vitals:   02/05/20 1230 02/05/20 1245  BP: (!) 159/94 (!) 149/89  Pulse: 80 81  Resp: 12 15  Temp:    SpO2: 95% 94%    Last Pain:  Vitals:   02/05/20 1245  TempSrc:   PainSc: [place]

## 2020-02-05 NOTE — Anesthesia Preprocedure Evaluation (Addendum)
Anesthesia Evaluation  Patient identified by MRN, date of birth, ID band Patient awake    Reviewed: Allergy & Precautions, NPO status , Patient's Chart, lab work & pertinent test results  Airway Mallampati: III  TM Distance: >3 FB Neck ROM: Full    Dental no notable dental hx. (+) Teeth Intact, Dental Advisory Given   Pulmonary neg pulmonary ROS,    Pulmonary exam normal breath sounds clear to auscultation       Cardiovascular hypertension, Pt. on medications Normal cardiovascular exam Rhythm:Regular Rate:Normal     Neuro/Psych  Headaches, PSYCHIATRIC DISORDERS Anxiety    GI/Hepatic negative GI ROS, Neg liver ROS,   Endo/Other  Papillary thyroid carcinoma   Renal/GU negative Renal ROS  negative genitourinary   Musculoskeletal  (+) Arthritis , Osteoarthritis,    Abdominal   Peds  Hematology negative hematology ROS (+) hct 48.2, plt 268   Anesthesia Other Findings   Reproductive/Obstetrics negative OB ROS                            Anesthesia Physical Anesthesia Plan  ASA: II  Anesthesia Plan: General   Post-op Pain Management:    Induction: Intravenous  PONV Risk Score and Plan: 4 or greater and Ondansetron, Dexamethasone, Midazolam, Treatment may vary due to age or medical condition and Scopolamine patch - Pre-op  Airway Management Planned: Oral ETT  Additional Equipment: None  Intra-op Plan:   Post-operative Plan: Extubation in OR  Informed Consent: I have reviewed the patients History and Physical, chart, labs and discussed the procedure including the risks, benefits and alternatives for the proposed anesthesia with the patient or authorized representative who has indicated his/her understanding and acceptance.     Dental advisory given  Plan Discussed with: CRNA  Anesthesia Plan Comments:        Anesthesia Quick Evaluation

## 2020-02-05 NOTE — Transfer of Care (Signed)
Immediate Anesthesia Transfer of Care Note  Patient: Lauren Mcclure  Procedure(s) Performed: TOTAL THYROIDECTOMY (N/A )  Patient Location: PACU  Anesthesia Type:General  Level of Consciousness: sedated  Airway & Oxygen Therapy: Patient Spontanous Breathing and Patient connected to face mask oxygen  Post-op Assessment: Report given to RN and Post -op Vital signs reviewed and stable  Post vital signs: Reviewed and stable  Last Vitals:  Vitals Value Taken Time  BP 158/101 02/05/20 1156  Temp    Pulse 88 02/05/20 1158  Resp 16 02/05/20 1158  SpO2 100 % 02/05/20 1158  Vitals shown include unvalidated device data.  Last Pain:  Vitals:   02/05/20 0705  TempSrc: Oral         Complications: No complications documented.

## 2020-02-05 NOTE — Op Note (Signed)
Procedure Note  Pre-operative Diagnosis:  Papillary thyroid carcinoma  Post-operative Diagnosis:  same  Surgeon:  Armandina Gemma, MD  Assistant:  none   Procedure:  Total thyroidectomy  Anesthesia:  General  Estimated Blood Loss:  minimal  Drains: none         Specimen: thyroid to pathology  Indications:  Patient is referred by Dr. Janett Billow Copland for surgical evaluation and management of newly diagnosed papillary thyroid carcinoma. Patient's endocrinologist is Dr. Vivia Ewing. Patient has a long-standing history of thyroid disease dating back approximately 10 years when she underwent an employment physical for the [employer]. It is noted to have a right-sided thyroid mass. Recently the patient had undergone a CT scan of the chest. Incidental finding was made of a left-sided thyroid nodule. Patient underwent ultrasound examination on December 08, 2019. This showed a dominant cystic lesions in the right thyroid lobe with the largest measuring 3.1 cm. However there was a 1.6 cm nodule in the inferior left thyroid lobe which was felt to be moderately suspicious by ultrasound criteria and biopsy was recommended. Patient underwent fine-needle aspiration biopsy on December 18, 2019. This returned as inadequate for evaluation. Patient subsequently underwent a repeat biopsy on December 26, 2019. This demonstrated findings consistent with papillary thyroid carcinoma, Bethesda category VI. Patient now presents for thyroidectomy.  Procedure Details: Procedure was done in OR #4 at the [facility]. The patient was brought to the operating room and placed in a supine position on the operating room table. Following administration of general anesthesia, the patient was positioned and then prepped and draped in the usual aseptic fashion. After ascertaining that an adequate level of anesthesia had been achieved, a small Kocher incision was made with #15 blade. Dissection was carried  through subcutaneous tissues and platysma.Hemostasis was achieved with the electrocautery. Skin flaps were elevated cephalad and caudad from the thyroid notch to the sternal notch. A Mahorner self-retaining retractor was placed for exposure. Strap muscles were incised in the midline and dissection was begun on the left side.  Strap muscles were reflected laterally.  Left thyroid lobe was normal in size with a dominant nodule in the superior pole.  The left lobe was gently mobilized with blunt dissection. Superior pole vessels were dissected out and divided individually between small and medium ligaclips with the harmonic scalpel. The thyroid lobe was rolled anteriorly. Branches of the inferior thyroid artery were divided between small ligaclips with the harmonic scalpel. Inferior venous tributaries were divided between ligaclips. Both the superior and inferior parathyroid glands were identified and preserved on their vascular pedicles. The recurrent laryngeal nerve was identified and preserved along its course. The ligament of Gwenlyn Found was released with the electrocautery and the gland was mobilized onto the anterior trachea. Isthmus was mobilized across the midline. There was a moderate sized pyramidal lobe present which was dissected off of the thyroid cartilage and resected with the isthmus. Dry pack was placed in the left neck.  The right thyroid lobe was gently mobilized with blunt dissection. Right thyroid lobe was moderately enlarged with a central cystic mass. Superior pole vessels were dissected out and divided between small and medium ligaclips with the Harmonic scalpel. Superior parathyroid was identified and preserved. Inferior venous tributaries were divided between medium ligaclips with the harmonic scalpel. The right thyroid lobe was rolled anteriorly and the branches of the inferior thyroid artery divided between small ligaclips. The right recurrent laryngeal nerve was identified and preserved along  its course. The ligament of  Gwenlyn Found was released with the electrocautery. The right thyroid lobe was mobilized onto the anterior trachea and the remainder of the thyroid was dissected off the anterior trachea and the thyroid was completely excised. A suture was used to mark the left lobe. The entire thyroid gland was submitted to pathology for review.  The neck was irrigated with warm saline. Fibrillar was placed throughout the operative field. Strap muscles were approximated in the midline with interrupted 3-0 Vicryl sutures. Platysma was closed with interrupted 3-0 Vicryl sutures. Skin was closed with a running 4-0 Monocryl subcuticular suture. Wound was washed and Dermabond was applied. The patient was awakened from anesthesia and brought to the recovery room. The patient tolerated the procedure well.   Armandina Gemma, MD [facility] Office: [phone]

## 2020-02-05 NOTE — Anesthesia Procedure Notes (Addendum)
Procedure Name: Intubation Date/Time: 02/05/2020 10:04 AM Performed by: Cynda Familia, CRNA Pre-anesthesia Checklist: Patient identified, Emergency Drugs available, Suction available and Patient being monitored Patient Re-evaluated:Patient Re-evaluated prior to induction Oxygen Delivery Method: Circle System Utilized Preoxygenation: Pre-oxygenation with 100% oxygen Induction Type: IV induction Ventilation: Mask ventilation without difficulty Laryngoscope Size: 2 and Miller Grade View: Grade II Tube type: Oral Number of attempts: 1 Airway Equipment and Method: Stylet Placement Confirmation: ETT inserted through vocal cords under direct vision,  positive ETCO2 and breath sounds checked- equal and bilateral Secured at: 22 cm Tube secured with: Tape Dental Injury: Teeth and Oropharynx as per pre-operative assessment  Comments: Smooth Iv induction Finucane-- intubation AM CRNA atraumatic-- teeth and mouth as preop- slight chipping left front tooth present preop and unchanged with laryngoscopy- bilat BS Finucane

## 2020-02-05 NOTE — Plan of Care (Signed)
Discussed with patient about plan of care for post-op day 0. ° ° °Will continue to monitor patient.  ° ° °SWhittemore, RN ° °

## 2020-02-05 NOTE — Anesthesia Procedure Notes (Signed)
Date/Time: 02/05/2020 11:51 AM Performed by: Cynda Familia, CRNA Oxygen Delivery Method: Simple face mask Placement Confirmation: positive ETCO2 and breath sounds checked- equal and bilateral Dental Injury: Teeth and Oropharynx as per pre-operative assessment

## 2020-02-05 NOTE — Interval H&P Note (Signed)
History and Physical Interval Note:  02/05/2020 8:26 AM  Lauren Mcclure  has presented today for surgery, with the diagnosis of papillary thyroid carcinoma.  The various methods of treatment have been discussed with the patient and family. After consideration of risks, benefits and other options for treatment, the patient has consented to    Procedure(s): TOTAL THYROIDECTOMY (N/A) as a surgical intervention.    The patient's history has been reviewed, patient examined, no change in status, stable for surgery.  I have reviewed the patient's chart and labs.  Questions were answered to the patient's satisfaction.    Armandina Gemma, MD [facility] Office: [place]

## 2020-02-06 ENCOUNTER — Other Ambulatory Visit: Payer: BC Managed Care – PPO

## 2020-02-06 ENCOUNTER — Encounter (HOSPITAL_COMMUNITY): Payer: Self-pay | Admitting: Surgery

## 2020-02-06 DIAGNOSIS — C73 Malignant neoplasm of thyroid gland: Secondary | ICD-10-CM | POA: Diagnosis not present

## 2020-02-06 LAB — BASIC METABOLIC PANEL
Anion gap: 10 (ref 5–15)
BUN: 15 mg/dL (ref 6–20)
CO2: 23 mmol/L (ref 22–32)
Calcium: 9.3 mg/dL (ref 8.9–10.3)
Chloride: 103 mmol/L (ref 98–111)
Creatinine, Ser: 0.98 mg/dL (ref 0.44–1.00)
GFR, Estimated: >60 mL/min (ref >60)
Glucose, Bld: 185 mg/dL — ABNORMAL HIGH (ref 70–99)
Potassium: 4 mmol/L (ref 3.5–5.1)
Sodium: 136 mmol/L (ref 135–145)

## 2020-02-06 LAB — SURGICAL PATHOLOGY

## 2020-02-06 MED ORDER — OXYCODONE HCL 5 MG PO TABS
5.0000 mg | ORAL_TABLET | Freq: Four times a day (QID) | ORAL | 0 refills | Status: DC | PRN
Start: 2020-02-06 — End: 2020-03-26

## 2020-02-06 MED ORDER — LEVOTHYROXINE SODIUM 100 MCG PO TABS
100.0000 ug | ORAL_TABLET | Freq: Every day | ORAL | 2 refills | Status: DC
Start: 2020-02-06 — End: 2020-05-09

## 2020-02-06 MED ORDER — CALCIUM CARBONATE ANTACID 500 MG PO CHEW: 2.0000 | CHEWABLE_TABLET | Freq: Two times a day (BID) | ORAL | 1 refills | Status: AC

## 2020-02-06 NOTE — Discharge Summary (Signed)
Physician Discharge Summary [facility]  Patient ID: Lauren Mcclure MRN: [id number] DOB/AGE: 50-02-1971 50 y.o.  Admit date: 02/05/2020 Discharge date: 02/06/2020  Admission Diagnoses:  Papillary thyroid carcinoma  Discharge Diagnoses:  Principal Problem:   Papillary thyroid carcinoma [facility]) Active Problems:   Thyroid cyst   Discharged Condition: good  Hospital Course: Patient was admitted for observation following thyroid surgery.  Post op course was uncomplicated.  Pain was well controlled.  Tolerated diet.  Post op calcium level on morning following surgery was 9.3 mg/dl.  Patient was prepared for discharge home on POD#1.  Consults: None  Treatments: surgery: total thyroidectomy  Discharge Exam: Blood pressure 102/69, pulse 81, temperature 97.6 F (36.4 C), temperature source Oral, resp. rate 18, height 5\' 6"  (1.676 m), weight 80.4 kg, last menstrual period 09/04/2018, SpO2 98 %. HEENT - clear Neck - wound dry and intact; Dermabond in place; mild STS; voice moderately hoarse Chest - clear bilaterally Cor - RRR  Disposition: Home  Discharge Instructions    Diet - low sodium heart healthy   Complete by: As directed    Discharge instructions   Complete by: As directed    [facility]  THYROID & PARATHYROID SURGERY:  POST-OP INSTRUCTIONS  Always review your discharge instruction sheet from the facility where your surgery was performed.  A prescription for pain medication may be given to you upon discharge.  Take your pain medication as prescribed.  If narcotic pain medicine is not needed, then you may take acetaminophen (Tylenol) or ibuprofen (Advil) as needed.  Take your usually prescribed medications unless otherwise directed.  If you need a refill on your pain medication, please contact our office during regular business hours.  Prescriptions cannot be processed by our office after 5 pm or on weekends.  Start with a light  diet upon arrival home, such as soup and crackers or toast.  Be sure to drink plenty of fluids daily.  Resume your normal diet the day after surgery.  Most patients will experience some swelling and bruising on the chest and neck area.  Ice packs will help.  Swelling and bruising can take several days to resolve.   It is common to experience some constipation after surgery.  Increasing fluid intake and taking a stool softener (Colace) will usually help or prevent this problem.  A mild laxative (Milk of Magnesia or Miralax) should be taken according to package directions if there has been no bowel movement after 48 hours.  You have steri-strips and a gauze dressing over your incision.  You may remove the gauze bandage on the second day after surgery, and you may shower at that time.  Leave your steri-strips (small skin tapes) in place directly over the incision.  These strips should remain on the skin for 5-7 days and then be removed.  You may get them wet in the shower and pat them dry.  You may resume regular (light) daily activities beginning the next day (such as daily self-care, walking, climbing stairs) gradually increasing activities as tolerated.  You may have sexual intercourse when it is comfortable.  Refrain from any heavy lifting or straining until approved by your doctor.  You may drive when you no longer are taking prescription pain medication, you can comfortably wear a seatbelt, and you can safely maneuver your car and apply brakes.  You should see your doctor in the office for a follow-up appointment approximately three weeks after your surgery.  Make  sure that you call for this appointment within a day or two after you arrive home to insure a convenient appointment time.  WHEN TO CALL YOUR DOCTOR: -- Fever greater than 101.5 -- Inability to urinate -- Nausea and/or vomiting - persistent -- Extreme swelling or bruising -- Continued bleeding from incision -- Increased pain, redness,  or drainage from the incision -- Difficulty swallowing or breathing -- Muscle cramping or spasms -- Numbness or tingling in hands or around lips  The clinic staff is available to answer your questions during regular business hours.  Please don't hesitate to call and ask to speak to one of the nurses if you have concerns.  Armandina Gemma, MD [facility] Office: [phone]   Increase activity slowly   Complete by: As directed    No dressing needed   Complete by: As directed      Allergies as of 02/06/2020      Reactions   Rocephin [ceftriaxone Sodium In Dextrose] Rash      Medication List    TAKE these medications   amLODipine 10 MG tablet Commonly known as: NORVASC TAKE 1 TABLET(10 MG) BY MOUTH DAILY What changed:   how much to take  how to take this  when to take this  additional instructions   CALCIUM 1200 PO Take 1 tablet by mouth daily.   calcium carbonate 500 MG chewable tablet Commonly known as: Tums Chew 2 tablets (400 mg of elemental calcium total) by mouth 2 (two) times daily.   celecoxib 200 MG capsule Commonly known as: CELEBREX Take 1 capsule (200 mg total) by mouth daily. Use as needed for knee pain What changed:   when to take this  reasons to take this  additional instructions   CRANBERRY PO Take 1 tablet by mouth daily.   levothyroxine 100 MCG tablet Commonly known as: Synthroid Take 1 tablet (100 mcg total) by mouth daily.   losartan 25 MG tablet Commonly known as: COZAAR Take 25 mg by mouth every evening.   multivitamin tablet Take 1 tablet by mouth daily.   oxyCODONE 5 MG immediate release tablet Commonly known as: Oxy IR/ROXICODONE Take 1-2 tablets (5-10 mg total) by mouth every 6 (six) hours as needed for moderate pain.   rosuvastatin 20 MG tablet Commonly known as: Crestor Take 1 tablet (20 mg total) by mouth daily.   TURMERIC PO Take 1 tablet by mouth daily as needed (inflammation).   VITAMIN C  PO Take 1 tablet by mouth daily.   zolpidem 5 MG tablet Commonly known as: AMBIEN Take 0.5-1 tablets (2.5-5 mg total) by mouth at bedtime as needed for sleep.            Discharge Care Instructions  (From admission, onward)         Start     Ordered   02/06/20 0000  No dressing needed        02/06/20 3267          Follow-up Information    Armandina Gemma, MD. Schedule an appointment as soon as possible for a visit in 3 week(s).   Specialty: General Surgery Contact information: [address] [place] [address] [phone]               Earnstine Regal, MD, [facility] Office: [phone]   Signed: Armandina Gemma 02/06/2020, 8:30 AM

## 2020-02-06 NOTE — Plan of Care (Signed)
Pt was discharged home today. Instructions were reviewed with patient, and questions were answered. Pt was taken to main entrance via wheelchair by NT.  

## 2020-02-20 ENCOUNTER — Ambulatory Visit
Admission: RE | Admit: 2020-02-20 | Discharge: 2020-02-20 | Disposition: A | Discharge status: Home / Self Care | Payer: BC Managed Care – PPO | Source: Ambulatory Visit | Attending: Family Medicine | Admitting: Family Medicine

## 2020-02-20 ENCOUNTER — Other Ambulatory Visit: Payer: Self-pay

## 2020-02-20 DIAGNOSIS — R200 Anesthesia of skin: Secondary | ICD-10-CM

## 2020-02-20 DIAGNOSIS — R42 Dizziness and giddiness: Secondary | ICD-10-CM

## 2020-02-22 ENCOUNTER — Telehealth: Payer: Self-pay | Admitting: Family Medicine

## 2020-02-22 DIAGNOSIS — K769 Liver disease, unspecified: Secondary | ICD-10-CM

## 2020-02-22 DIAGNOSIS — R195 Other fecal abnormalities: Secondary | ICD-10-CM

## 2020-02-22 DIAGNOSIS — R7401 Elevation of levels of liver transaminase levels: Secondary | ICD-10-CM

## 2020-02-22 NOTE — Telephone Encounter (Signed)
Called pt to go over her brain MRI Non specific findings, no acute CVA Offered neurology eval- for now she prefers to think about it and will let me know if she desires referral  She needs referral to GI for positive cologuard and diffuse fatty liver- referral was placed earlier this year but she did not end up being seen as of yet She also has a irritating mole on her back- I will look at this at her convenience and remove if I am able

## 2020-02-29 ENCOUNTER — Other Ambulatory Visit: Payer: Self-pay | Admitting: Family Medicine

## 2020-03-01 NOTE — Telephone Encounter (Signed)
Requesting: ambien Contract:n/a UDS:n/a Last Visit:01/02/2020 Next Visit:n/a Last Refill:10/21  Please Advise

## 2020-03-18 ENCOUNTER — Encounter: Payer: Self-pay | Admitting: Internal Medicine

## 2020-03-18 ENCOUNTER — Ambulatory Visit (INDEPENDENT_AMBULATORY_CARE_PROVIDER_SITE_OTHER): Payer: BC Managed Care – PPO | Admitting: Internal Medicine

## 2020-03-18 ENCOUNTER — Other Ambulatory Visit: Payer: Self-pay

## 2020-03-18 VITALS — BP 128/86 | HR 102 | Ht 66.0 in | Wt 179.0 lb

## 2020-03-18 DIAGNOSIS — E890 Postprocedural hypothyroidism: Secondary | ICD-10-CM | POA: Diagnosis not present

## 2020-03-18 DIAGNOSIS — C73 Malignant neoplasm of thyroid gland: Secondary | ICD-10-CM | POA: Diagnosis not present

## 2020-03-18 LAB — BASIC METABOLIC PANEL
BUN: 11 mg/dL (ref 6–23)
CO2: 26 mEq/L (ref 19–32)
Calcium: 9.9 mg/dL (ref 8.4–10.5)
Chloride: 103 mEq/L (ref 96–112)
Creatinine, Ser: 0.9 mg/dL (ref 0.40–1.20)
GFR: 74.35 mL/min (ref >60.00)
Glucose, Bld: 117 mg/dL — ABNORMAL HIGH (ref 70–99)
Potassium: 3.9 mEq/L (ref 3.5–5.1)
Sodium: 139 mEq/L (ref 135–145)

## 2020-03-18 LAB — TSH: TSH: 0.14 u[IU]/mL — ABNORMAL LOW (ref 0.35–4.50)

## 2020-03-18 LAB — VITAMIN D 25 HYDROXY (VIT D DEFICIENCY, FRACTURES): VITD: 23.32 ng/mL — ABNORMAL LOW (ref 30.00–100.00)

## 2020-03-18 NOTE — Patient Instructions (Signed)

## 2020-03-18 NOTE — Progress Notes (Signed)
Name: Lauren Mcclure  MRN/ DOB: [id number], 1971-02-20    Age/ Sex: 49 y.o., female     PCP: Copland, Gay Filler, MD   Reason for Endocrinology Evaluation: [facility]     Initial Endocrinology Clinic Visit: 01/08/2020    PATIENT IDENTIFIER: Lauren Mcclure is a 49 y.o., female with a past medical history of PTC. She has followed with [facility] since 01/08/2020 for consultative assistance with management of her PTC.   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 The tumor was unifocal , 1.2 cm in diameter on the left. Margins uninvolved . One L.N submitted with no involvement.    No  FH of thyroid disease   SUBJECTIVE:    Today (03/18/2020):  Lauren Mcclure is here for a follow up on PTC.    She has  Been on LT-4 replacement since her surgery.  She is recovering well , she does recall having perioral numbness after her sx , she has been off calcium  Does not recall hand cramps but noted hand tremors as well as dizziness.  Denies diarrhea  Has occasional palpitations   Has been noted with fluctuating BP's   She stopped eating fried foods and has been eating fresh fruits and vegetables  She continues with voice hoarseness      HISTORY:  Past Medical History:  Past Medical History:  Diagnosis Date  . Anxiety   . Arthritis   . Elevated blood pressure reading   . Headache   . History of hyperlipidemia   . History of kidney stones   . Hypertension   . Kidney stones   . 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 LITHOTRIPSY    . NASAL SEPTUM SURGERY  2007  . THYROIDECTOMY N/A 02/05/2020   Procedure: TOTAL THYROIDECTOMY;  Surgeon: Armandina Gemma, MD;  Location: [facility];  Service: General;  Laterality: N/A;  . TONSILLECTOMY     Social History:  reports that she has never smoked. She has never used smokeless tobacco. She reports current alcohol use. She reports that she does not use drugs. Family History:  Family History  Problem Relation Age of Onset  . Hypertension Mother   . Hyperlipidemia Mother   . Pancreatic cancer Father        deceased age 50  . Cancer - Other Paternal Grandmother   . Skin cancer Paternal Grandfather   . Heart disease Paternal Grandfather   . Hyperlipidemia Maternal Aunt      HOME MEDICATIONS: Allergies as of 03/18/2020      Reactions   Rocephin [ceftriaxone Sodium In Dextrose] Rash      Medication List       Accurate as of March 18, 2020 10:32 AM. If you have any questions, ask your nurse or doctor.        amLODipine 10 MG tablet Commonly known as: NORVASC TAKE 1 TABLET(10 MG) BY MOUTH DAILY What changed:  how much to take how to take this when to take this  additional instructions   CALCIUM 1200 PO Take 1 tablet by mouth daily.   calcium carbonate 500 MG chewable tablet Commonly known as: Tums Chew 2 tablets (400 mg of elemental calcium total) by mouth 2 (two) times daily.   celecoxib 200 MG capsule Commonly known as: CELEBREX Take 1 capsule (200 mg total) by mouth daily. Use as needed for knee pain What changed:  when to take this reasons to take this additional instructions   CRANBERRY PO Take 1 tablet by mouth daily.   levothyroxine 100 MCG tablet Commonly known as: Synthroid Take 1 tablet (100 mcg total) by mouth daily.   losartan 25 MG tablet Commonly known as: COZAAR Take 25 mg by mouth every evening.   multivitamin tablet Take 1 tablet by mouth daily.   oxyCODONE 5 MG immediate release tablet Commonly  known as: Oxy IR/ROXICODONE Take 1-2 tablets (5-10 mg total) by mouth every 6 (six) hours as needed for moderate pain.   rosuvastatin 20 MG tablet Commonly known as: Crestor Take 1 tablet (20 mg total) by mouth daily.   TURMERIC PO Take 1 tablet by mouth daily as needed (inflammation).   VITAMIN C PO Take 1 tablet by mouth daily.   zolpidem 5 MG tablet Commonly known as: AMBIEN TAKE 1/2 TO 1 TABLET(2.5 TO 5 MG) BY MOUTH AT BEDTIME AS NEEDED FOR SLEEP         OBJECTIVE:   PHYSICAL EXAM: VS: BP 128/86   Pulse (!) 102   Ht '5\' 6"'  (1.676 m)   Wt 179 lb (81.2 kg)   LMP 09/04/2018 Comment: possible perimenopausal  SpO2 98%   BMI 28.89 kg/m    EXAM: General: Pt appears well and is in NAD  Neck: General: Supple without adenopathy. Thyroid: Thyroid size normal.  No goiter or nodules appreciated. No thyroid bruit.  Lungs: Clear with good BS bilat with no rales, rhonchi, or wheezes  Heart: Auscultation: RRR.  Abdomen: Normoactive bowel sounds, soft, nontender, without masses or organomegaly palpable  Extremities:  BL LE: No pretibial edema normal ROM and strength.  Mental Status: Judgment, insight: Intact Memory: Intact for recent and remote events Mood and affect: No depression, anxiety, or agitation     DATA REVIEWED: Results for Lauren, Mcclure (MRN [id number]) as of 03/19/2020 12:10  Ref. Range 03/18/2020 11:00  Sodium Latest Ref Range: 135 - 145 mEq/L 139  Potassium Latest Ref Range: 3.5 - 5.1 mEq/L 3.9  Chloride Latest Ref Range: 96 - 112 mEq/L 103  CO2 Latest Ref Range: 19 - 32 mEq/L 26  Glucose Latest Ref Range: 70 - 99 mg/dL 117 (H)  BUN Latest Ref Range: 6 - 23 mg/dL 11  Creatinine Latest Ref Range: 0.40 - 1.20 mg/dL 0.90  Calcium Latest Ref Range: 8.4 - 10.5 mg/dL 9.9  GFR Latest Ref Range: >60.00 mL/min 74.35  VITD Latest Ref Range: 30.00 - 100.00 ng/mL 23.32 (L)  Results for Lauren, Mcclure (MRN [id number]) as of 03/19/2020 12:10  Ref. Range 03/18/2020  11:00  TSH Latest Ref Range: 0.35 - 4.50 uIU/mL 0.14 (L)    Thyroid Pathology 02/05/2020  FINAL MICROSCOPIC DIAGNOSIS:   A. 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)  - Benign cyst (2.4 cm)  - See oncology table and comment below   ONCOLOGY TABLE:   THYROID GLAND:   Procedure: Thyroidectomy  Tumor Focality: Unifocal  Tumor Site: Left lobe  Tumor Size: 1.2 cm  Histologic Type: Papillary carcinoma, classic  Margins: Uninvolved by tumor  Angioinvasion: Not identified  Lymphatic Invasion: Not identified  Extrathyroidal extension: Not identified  Regional Lymph Nodes:    Number of Lymph Nodes Involved: 0    Nodal Levels Involved: 0    Size of Largest Metastatic Deposit: N/A    Extranodal Extension (ENE): N/A    Number of Lymph Nodes Examined: 1  Pathologic Stage Classification (pTNM, AJCC8th Edition): pT1b, pN0   ASSESSMENT / PLAN / RECOMMENDATIONS:   Papillary Thyroid Carcinoma, Stage I :   - S/P total thyroidectomy on 02/05/2020 - She is at low risk for recurrence - She is clinically hyperthyroid , TSH goal 0.5-2.0 uIU/mL if nonstimultated Tg is undetectable.  - Tg, Tg Ab - pending  - Given her poor quality with hyperthyroid symptoms, I am going to slightly reduce her levothyroxine as below   Medications  Decrease Levothyroxine 100 mcg, half a tablet on Friday and 1 tablet rest of the week   2. Postoperative Hypothyroidism :  - As Above      3. Vitamin D Insufficiency :   - Pt advised to start OTC Vitamin D 10000 iu daily   F/U in 6 months , pt will come back in April, as she is traveling to the [place] for 3 months to visit mother    Signed electronically by: Mack Guise, MD  [facility]  [address] [place], [place] [address] Phone: [phone] FAX:  [phone]      CC: [address], [address] [place] [address] Phone: [phone]  Fax: [phone]   Return to Endocrinology clinic as below: No future appointments.

## 2020-03-19 LAB — THYROGLOBULIN LEVEL: Thyroglobulin: 0.1 ng/mL — ABNORMAL LOW

## 2020-03-19 LAB — THYROGLOBULIN ANTIBODY: Thyroglobulin Ab: 1 IU/mL (ref <=1)

## 2020-03-21 NOTE — Progress Notes (Addendum)
[place] [facility] [address] [place], [place] [address] [phone]  Date:  03/26/2020   Name:  Lauren Mcclure   DOB:  12-18-1970   MRN:  [id number]  PCP:  Darreld Mclean, MD    Chief Complaint: Nevus (On back, itching, change in size and shape, dark /)   History of Present Illness:  Lauren Mcclure is a 49 y.o. very pleasant female patient who presents with the following:  Pt with history of HTN, thyroid cancer, prediabetes Here today with concern of a mole on her back that needs to be removed  We actually removed a lesion from this area in November 2017, pathology showed a seborrheic keratosis.  Patient reports it has come back and is tender and irritated by clothing  However the patient also contacted me yesterday, she is concerned about return to work  Over the last several months she was found to have thyroid cancer, and also elevated LFTs-this led to an MRI of her liver which showed significant fatty infiltration She underwent total thyroidectomy on November 2 MRI liver IMPRESSION: 1. Area of concern in the RIGHT hepatic lobe corresponds to a cyst within a markedly steatotic liver. Given the degree of steatosis the patient would be at risk for steatohepatitis. 2. Small hyperenhancing focus in the peripheral RIGHT hepatic lobe is compatible with small hemangioma or benign vascular shunt. 3. Sludge in the dependent gallbladder.  I placed a referral to GI for her October 25 for steatosis and also positive Cologuard She has an appointment to see GI on 04/23/19  Most recent visit with endocrinology December 14, adjustment made to levothyroxine Pt is feeling concerned about trying to return to work. She feels shaky, will feel hot and cold, she is having headaches Her TSH was low at that time- they adjusted her levothyroxine She was low on vitamin D also and is taking a supplement  Lauren Mcclure has been out of work  since 10/25 in preparation for her thyroid surgery- she does not feel like she is ready to get back to work right now She work is physically strenuous- she lifts up to 50 lbs at a time She does not feel that she is yet strong enough to return to this work She notes that she may feel dizzy with standing and walking  COVID-19 booster will be due later this month Positive Cologuard-needs colonoscopy, GI appointment is scheduled  Patient Active Problem List   Diagnosis Date Noted  . Papillary thyroid carcinoma ([place]) 02/03/2020  . 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 Dyslipidemia 11/28/2019  . Prediabetes 11/28/2019  . Essential hypertension 05/26/2019    Past Medical History:  Diagnosis Date  . Anxiety   . Arthritis   . Elevated blood pressure reading   . Headache   . History of hyperlipidemia   . History of kidney stones   . Hypertension   . Kidney stones   . 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 LITHOTRIPSY    . NASAL SEPTUM SURGERY  2007  . THYROIDECTOMY N/A 02/05/2020   Procedure: TOTAL THYROIDECTOMY;  Surgeon: Armandina Gemma, MD;  Location: [facility];  Service: General;  Laterality: N/A;  . TONSILLECTOMY      Social History   Tobacco Use  . Smoking status: Never Smoker  . Smokeless tobacco: Never Used  Vaping Use  .  Vaping Use: Never used  Substance Use Topics  . Alcohol use: Yes    Comment: Occ wine or beer  . Drug use: No    Family History  Problem Relation Age of Onset  . Hypertension Mother   . Hyperlipidemia Mother   . Pancreatic cancer Father        deceased age 15  . Cancer - Other Paternal Grandmother   . Skin cancer Paternal Grandfather   . Heart disease Paternal Grandfather   . Hyperlipidemia Maternal Aunt     Allergies  Allergen Reactions  . Rocephin [Ceftriaxone Sodium In Dextrose] Rash    Medication list has been reviewed and  updated.  Current Outpatient Medications on File Prior to Visit  Medication Sig Dispense Refill  . amLODipine (NORVASC) 10 MG tablet TAKE 1 TABLET(10 MG) BY MOUTH DAILY (Patient taking differently: Take 10 mg by mouth daily.) 90 tablet 3  . Ascorbic Acid (VITAMIN C PO) Take 1 tablet by mouth daily.    . calcium carbonate (TUMS) 500 MG chewable tablet Chew 2 tablets (400 mg of elemental calcium total) by mouth 2 (two) times daily. 90 tablet 1  . Calcium Carbonate-Vit D-Min (CALCIUM 1200 PO) Take 1 tablet by mouth daily.     . celecoxib (CELEBREX) 200 MG capsule Take 1 capsule (200 mg total) by mouth daily. Use as needed for knee pain (Patient taking differently: Take 200 mg by mouth daily as needed for moderate pain.) 30 capsule 0  . CRANBERRY PO Take 1 tablet by mouth daily.    Marland Kitchen levothyroxine (SYNTHROID) 100 MCG tablet Take 1 tablet (100 mcg total) by mouth daily. 30 tablet 2  . losartan (COZAAR) 25 MG tablet Take 25 mg by mouth every evening.     . Multiple Vitamin (MULTIVITAMIN) tablet Take 1 tablet by mouth daily.    Marland Kitchen zolpidem (AMBIEN) 5 MG tablet TAKE 1/2 TO 1 TABLET(2.5 TO 5 MG) BY MOUTH AT BEDTIME AS NEEDED FOR SLEEP 15 tablet 2  . rosuvastatin (CRESTOR) 20 MG tablet Take 1 tablet (20 mg total) by mouth daily. (Patient not taking: Reported on 03/26/2020) 90 tablet 3  . TURMERIC PO Take 1 tablet by mouth daily as needed (inflammation).      No current facility-administered medications on file prior to visit.    Review of Systems:  As per HPI- otherwise negative.   Physical Examination: Vitals:   03/26/20 1009  BP: 128/80  Pulse: (!) 104  Resp: 17  SpO2: 97%   Vitals:   03/26/20 1009  Weight: 174 lb (78.9 kg)  Height: 5\' 6"  (1.676 m)   Body mass index is 28.08 kg/m. Ideal Body Weight: Weight in (lb) to have BMI = 25: 154.6  GEN: no acute distress.  Overweight, generally looks well HEENT: Atraumatic, Normocephalic.  Healing thyroidectomy scar Bilateral TM wnl,  oropharynx normal.  PEERL,EOMI.   Ears and Nose: No external deformity. CV: RRR, No M/G/R. No JVD. No thrill. No extra heart sounds. PULM: CTA B, no wheezes, crackles, rhonchi. No retractions. No resp. distress. No accessory muscle use. ABD: S, NT, ND, +BS. No rebound. No HSM. EXTR: No c/c/e PSYCH: Normally interactive. Conversant.  The patient has a nickel sized, verrucous appearing skin lesion on her left lower back as pictured below    She would like me to shave this lesion off for her.  I advised her that surgical excision is also an option.  For the time being she would like shave option which we  can do in the office today  Verbal consent given for procedure Area scrubbed with Betadine, anesthesia achieved with 3 cc of 1% lidocaine with epinephrine Performed partial shave of skin lesion-it is more embedded in the dermis than anticipated and is not at all pedunculated.  I removed the medial portion of the skin lesion, but then elected to stop; I feel this lesion is too large for me to shave here in the office- sample to pathology  Hemostasis obtained with silver nitrate sticks and direct pressure Bandage applied   Assessment and Plan: Suspicious nevus - Plan: Surgical pathology( [place]/ [facility])  Papillary thyroid carcinoma (HCC)  Post-operative hypothyroidism  Transaminitis - Plan: Comprehensive metabolic panel  Post-operative state     Patient here today with a couple of concerns. She recently underwent thyroidectomy, is still in the midst of work-up for transaminitis.  She has a physically demanding job, does not currently feel that she is up to the demands of her work.  I do think it is reasonable for her to take a bit more time to heal after surgery and get her thyroid dose adjusted correctly.  I gave her a note to be out of work until February 1, at that point she will reassess if she is able to return to this particular job  She has plans to see GI for  transaminitis and positive Cologuard  Skin lesion on her left lower back as above, partially removed but I decided it is too large to completely shave here in the office We will send specimen to path, refer patient to dermatology for further treatment, may need surgical excision Discussed wound care with patient, signs of infection and that she should call if any concern about her wound We will not charge patient for procedure is we were not able to complete treatment This visit occurred during the SARS-CoV-2 public health emergency.  Safety protocols were in place, including screening questions prior to the visit, additional usage of staff PPE, and extensive cleaning of exam room while observing appropriate contact time as indicated for disinfecting solutions.   addnd 1/25- I spoke with her husband Tim.  He notes that Linea continues to feel tired and will get winded/ dizzy with prolonged walking.  She is recovering now from removal of skin cancer from her back, and is seeing Gi to discuss her liver this week.  We plan for her to RTW estimate 06/03/20 Signed Lamar Blinks, MD  addnd 12/23- received her labs as below LFTS are elevated more again, visit with GI next month   Results for orders placed or performed in visit on 03/26/20  Comprehensive metabolic panel  Result Value Ref Range   Sodium 139 135 - 145 mEq/L   Potassium 4.0 3.5 - 5.1 mEq/L   Chloride 102 96 - 112 mEq/L   CO2 25 19 - 32 mEq/L   Glucose, Bld 128 (H) 70 - 99 mg/dL   BUN 10 6 - 23 mg/dL   Creatinine, Ser 0.86 0.40 - 1.20 mg/dL   Total Bilirubin 1.6 (H) 0.2 - 1.2 mg/dL   Alkaline Phosphatase 105 39 - 117 U/L   AST 80 (H) 0 - 37 U/L   ALT 142 (H) 0 - 35 U/L   Total Protein 7.9 6.0 - 8.3 g/dL   Albumin 4.8 3.5 - 5.2 g/dL   GFR 78.51 >60.00 mL/min   Calcium 10.3 8.4 - 10.5 mg/dL   Letter to pt

## 2020-03-24 ENCOUNTER — Telehealth: Payer: Self-pay | Admitting: Family Medicine

## 2020-03-24 NOTE — Telephone Encounter (Signed)
Patient husband would like to speak to Dr. Lorelei Pont in regards to Dr. Kelton Pillar releasing wife to go back to work.

## 2020-03-25 NOTE — Telephone Encounter (Signed)
Called and spoke with Lauren Mcclure- she seemed to want me to talk with her husband.  She is seeing me tomorrow but he is not able to come with her due to work.  I called him and LMOM on his phone- perhaps write down his concerns? [place]

## 2020-03-26 ENCOUNTER — Encounter: Payer: Self-pay | Admitting: Family Medicine

## 2020-03-26 ENCOUNTER — Other Ambulatory Visit: Payer: Self-pay | Admitting: Family Medicine

## 2020-03-26 ENCOUNTER — Ambulatory Visit (INDEPENDENT_AMBULATORY_CARE_PROVIDER_SITE_OTHER): Payer: BC Managed Care – PPO | Admitting: Family Medicine

## 2020-03-26 ENCOUNTER — Other Ambulatory Visit: Payer: Self-pay

## 2020-03-26 VITALS — BP 128/80 | HR 104 | Resp 17 | Ht 66.0 in | Wt 174.0 lb

## 2020-03-26 DIAGNOSIS — C73 Malignant neoplasm of thyroid gland: Secondary | ICD-10-CM

## 2020-03-26 DIAGNOSIS — D229 Melanocytic nevi, unspecified: Secondary | ICD-10-CM | POA: Diagnosis not present

## 2020-03-26 DIAGNOSIS — E890 Postprocedural hypothyroidism: Secondary | ICD-10-CM

## 2020-03-26 DIAGNOSIS — R7401 Elevation of levels of liver transaminase levels: Secondary | ICD-10-CM | POA: Diagnosis not present

## 2020-03-26 DIAGNOSIS — Z9889 Other specified postprocedural states: Secondary | ICD-10-CM

## 2020-03-26 LAB — COMPREHENSIVE METABOLIC PANEL
ALT: 142 U/L — ABNORMAL HIGH (ref 0–35)
AST: 80 U/L — ABNORMAL HIGH (ref 0–37)
Albumin: 4.8 g/dL (ref 3.5–5.2)
Alkaline Phosphatase: 105 U/L (ref 39–117)
BUN: 10 mg/dL (ref 6–23)
CO2: 25 mEq/L (ref 19–32)
Calcium: 10.3 mg/dL (ref 8.4–10.5)
Chloride: 102 mEq/L (ref 96–112)
Creatinine, Ser: 0.86 mg/dL (ref 0.40–1.20)
GFR: 78.51 mL/min (ref >60.00)
Glucose, Bld: 128 mg/dL — ABNORMAL HIGH (ref 70–99)
Potassium: 4 mEq/L (ref 3.5–5.1)
Sodium: 139 mEq/L (ref 135–145)
Total Bilirubin: 1.6 mg/dL — ABNORMAL HIGH (ref 0.2–1.2)
Total Protein: 7.9 g/dL (ref 6.0–8.3)

## 2020-03-26 NOTE — Patient Instructions (Signed)
It was good to see you again today- I will be in touch with your labs asap Please see Gi as planned I am going to refer you to dermatology to have the lesion from your back surgically excised it is too large for [place] to do here We will check on your liver tests today Please have your HR contact me about your work if need be

## 2020-03-27 ENCOUNTER — Other Ambulatory Visit: Payer: Self-pay | Admitting: Family Medicine

## 2020-04-02 ENCOUNTER — Telehealth: Payer: Self-pay | Admitting: Family Medicine

## 2020-04-02 NOTE — Telephone Encounter (Signed)
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 I have referred her to dermatology as this lesion seemed to invade the dermis and I was not able to completely remove it in clinic  Referral was sent to [facility] dermatology-patient reports she has an appointment coming up on January 18 I advised her that she does have a skin cancer, this will need to be surgically excised by dermatology We will fax a copy of her pathology report to [facility] dermatology, I will also mail a copy to her, asked her to take it with her to her appointment  Fax [phone] [facility] [place] derm, pt is seeing Albania Black PA-C

## 2020-04-08 HISTORY — PX: SKIN CANCER EXCISION: SHX779

## 2020-04-22 ENCOUNTER — Ambulatory Visit: Payer: BC Managed Care – PPO | Admitting: Gastroenterology

## 2020-04-29 ENCOUNTER — Telehealth: Payer: Self-pay | Admitting: Family Medicine

## 2020-04-29 NOTE — Telephone Encounter (Signed)
I did see this last week and placed in your folder. Do you still have the ppwk?

## 2020-04-29 NOTE — Telephone Encounter (Signed)
Patient husband calling to follow up on a fax that was sent from  [place] for his wife     Caller phone number [phone]  Please advice

## 2020-04-29 NOTE — Telephone Encounter (Signed)
Lauren Mcclure, I have ppw.  Called and discussed with her husband- the patient has some difficulty with communication over the phone. I apologized for the delay- working from home this week as my son has covid We discussed an estimated RTW date of 06/03/20.  Will fax in paperwork for her tomorrow when I am in the office

## 2020-04-30 NOTE — Telephone Encounter (Signed)
Paperwork has been faxed. Copy in faxed folder in desk drawer for patient until scanned in to media.

## 2020-05-02 ENCOUNTER — Other Ambulatory Visit (INDEPENDENT_AMBULATORY_CARE_PROVIDER_SITE_OTHER): Payer: BC Managed Care – PPO

## 2020-05-02 ENCOUNTER — Other Ambulatory Visit: Payer: Self-pay

## 2020-05-02 ENCOUNTER — Other Ambulatory Visit (HOSPITAL_BASED_OUTPATIENT_CLINIC_OR_DEPARTMENT_OTHER): Payer: Self-pay | Admitting: Family Medicine

## 2020-05-02 ENCOUNTER — Encounter: Payer: Self-pay | Admitting: Gastroenterology

## 2020-05-02 ENCOUNTER — Ambulatory Visit (INDEPENDENT_AMBULATORY_CARE_PROVIDER_SITE_OTHER): Payer: BC Managed Care – PPO | Admitting: Gastroenterology

## 2020-05-02 VITALS — BP 126/86 | HR 89 | Ht 66.0 in | Wt 180.1 lb

## 2020-05-02 DIAGNOSIS — Z1231 Encounter for screening mammogram for malignant neoplasm of breast: Secondary | ICD-10-CM

## 2020-05-02 DIAGNOSIS — R1011 Right upper quadrant pain: Secondary | ICD-10-CM

## 2020-05-02 DIAGNOSIS — Z800 Family history of malignant neoplasm of digestive organs: Secondary | ICD-10-CM | POA: Diagnosis not present

## 2020-05-02 DIAGNOSIS — R7401 Elevation of levels of liver transaminase levels: Secondary | ICD-10-CM

## 2020-05-02 DIAGNOSIS — K760 Fatty (change of) liver, not elsewhere classified: Secondary | ICD-10-CM

## 2020-05-02 DIAGNOSIS — R195 Other fecal abnormalities: Secondary | ICD-10-CM

## 2020-05-02 DIAGNOSIS — K828 Other specified diseases of gallbladder: Secondary | ICD-10-CM

## 2020-05-02 NOTE — Patient Instructions (Signed)
If you are age 51 or older, your body mass index should be between 23-30. Your Body mass index is 29.07 kg/m. If this is out of the aforementioned range listed, please consider follow up with your Primary Care Provider.  If you are age 51 or younger, your body mass index should be between 19-25. Your Body mass index is 29.07 kg/m. If this is out of the aformentioned range listed, please consider follow up with your Primary Care Provider.   You have been scheduled for an endoscopy. Please follow written instructions given to you at your visit today. If you use inhalers (even only as needed), please bring them with you on the day of your procedure.  You have been scheduled for a colonoscopy. Please follow written instructions given to you at your visit today.  Please pick up your prep supplies at the pharmacy within the next 1-3 days. If you use inhalers (even only as needed), please bring them with you on the day of your procedure.  We have sent the following medications to your pharmacy for you to pick up at your convenience: Clenpiq  You have been scheduled for a HIDA scan at [facility] (1st floor) on 05/[facility]  . Please arrive 15 minutes prior to your scheduled appointment at  [place] arrive at 5:10CH. Make certain not to have anything to eat /or drink at least 6 hours prior to your test./ Should this appointment date or time not work well for you, please call radiology scheduling at [phone].  _____________________________________________________________________ hepatobiliary (HIDA) scan is an imaging procedure used to diagnose problems in the liver, gallbladder and bile ducts. In the HIDA scan, a radioactive chemical or tracer is injected into a vein in your arm. The tracer is handled by the liver like bile. Bile is a fluid produced and excreted by your liver that helps your digestive system break down fats in the foods you eat. Bile is stored in your gallbladder and the gallbladder releases the  bile when you eat a meal. A special nuclear medicine scanner (gamma camera) tracks the flow of the tracer from your liver into your gallbladder and small intestine.  During your HIDA scan  You'll be asked to change into a hospital gown before your HIDA scan begins. Your health care team will position you on a table, usually on your back. The radioactive tracer is then injected into a vein in your arm.The tracer travels through your bloodstream to your liver, where it's taken up by the bile-producing cells. The radioactive tracer travels with the bile from your liver into your gallbladder and through your bile ducts to your small intestine.You may feel some pressure while the radioactive tracer is injected into your vein. As you lie on the table, a special gamma camera is positioned over your abdomen taking pictures of the tracer as it moves through your body. The gamma camera takes pictures continually for about an hour. You'll need to keep still during the HIDA scan. This can become uncomfortable, but you may find that you can lessen the discomfort by taking deep breaths and thinking about other things. Tell your health care team if you're uncomfortable. The radiologist will watch on a computer the progress of the radioactive tracer through your body. The HIDA scan may be stopped when the radioactive tracer is seen in the gallbladder and enters your small intestine. This typically takes about an hour. In some cases extra imaging will be performed if original images aren't satisfactory, if morphine is  given to help visualize the gallbladder or if the medication CCK is given to look at the contraction of the gallbladder. This test typically takes 2 hours to complete. ________________________________________________________________________   Lauren Mcclure have been referred to Genetics, you will be contacted with this appointment   It was a pleasure to see you today!  Vito Cirigliano, D.O.

## 2020-05-02 NOTE — Progress Notes (Signed)
Chief Complaint: Elevated liver enzymes positive Cologuard, abdominal pain   Referring Provider:     Darreld Mclean, MD    HPI:     Lauren Mcclure is a 50 y.o. 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  Cologuard was positive in 06/2019.  She is otherwise without hematochezia or melena.  No known family history of colon cancer.  No previous colonoscopy.  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  Previous peak AST/ALT 96/133 in 11/2019, then down trended for a few months, and back up again in 03/2020 at 80/142.  Otherwise been normal ALP and albumin.  Normal renal function -RUQ [place] (9/21): Hepatic steatosis, 8 mm x 7 mm x 7 mm indeterminate right hepatic lobe lesion.  1 cm right hepatic dome cyst.  No gallstones, normal CBD -MRI liver (10/21): Right hepatic lobe cyst, marked steatosis, small hemangioma vs benign vascular shunt in right hepatic lobe, GB sludge. -Vitamin D 23 -TSH 0.14 -Negative hepatitis C (11/2019)  Separately, has intermittent RUQ discomfort, but not described as pain. Started approx 6-8 months ago. No clear alleviating/exacerbating factors. Some associated nausea, but no emesis.   Also with recent LE edema that resolves with LE elevation.   Separately, had squamous cell carcinoma removed from back last month.  Has received Covid vaccine series plus booster.  Father died of Pancreatic CA at age 72. Brother with some liver issues, but she isnt sure the details. Neither of  them drank at all. No other known FHx of pancreaticobiliary disease. No known family history of CRC, GI malignancy, liver disease, or IBD.   CMP Latest Ref Rng & Units 03/26/2020 03/18/2020 02/06/2020  Glucose 70 - 99 mg/dL 128(H) 117(H) 185(H)  BUN 6 - 23 mg/dL 10 11 15   Creatinine 0.40 - 1.20 mg/dL 0.86 0.90 0.98  Sodium 135 - 145 mEq/L 139 139 136  Potassium 3.5 - 5.1 mEq/L 4.0 3.9 4.0  Chloride 96 - 112 mEq/L 102 103 103  CO2 19 - 32 mEq/L 25 26 23   Calcium 8.4 - 10.5 mg/dL 10.3 9.9 9.3  Total Protein 6.0 - 8.3 g/dL 7.9 - -  Total Bilirubin 0.2 - 1.2 mg/dL 1.6(H) - -  Alkaline Phos 39 - 117 U/L 105 - -  AST 0 - 37 U/L 80(H) - -  ALT 0 - 35 U/L 142(H) - -     Past Medical History:  Diagnosis Date  . Anxiety   . Arthritis   . Elevated blood pressure reading   . Headache   . History of hyperlipidemia   . History of kidney stones   . Hypertension   . Kidney stones   . 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 LITHOTRIPSY    . NASAL SEPTUM SURGERY  2007  . SKIN CANCER EXCISION  04/08/2020   Left lower back   . THYROIDECTOMY N/A 02/05/2020   Procedure: TOTAL THYROIDECTOMY;  Surgeon: Armandina Gemma,  MD;  Location: [facility];  Service: General;  Laterality: N/A;  . TONSILLECTOMY     Family History  Problem Relation Age of Onset  . Hypertension Mother   . Hyperlipidemia Mother   . Pancreatic cancer Father        deceased age 97  . Cancer - Other Paternal Grandmother   . Skin cancer Paternal Grandfather   . Heart disease Paternal Grandfather   . Hyperlipidemia Maternal Aunt   . Colon cancer Neg Hx   . Esophageal cancer Neg Hx    Social History   Tobacco Use  . Smoking status: Never Smoker  . Smokeless tobacco: Never Used  Vaping Use  . Vaping Use: Never used  Substance Use Topics  . Alcohol use: Yes    Comment: Occ wine or beer  . Drug use: No   Current  Outpatient Medications  Medication Sig Dispense Refill  . amLODipine (NORVASC) 10 MG tablet TAKE 1 TABLET(10 MG) BY MOUTH DAILY (Patient taking differently: Take 10 mg by mouth daily.) 90 tablet 3  . Ascorbic Acid (VITAMIN C PO) Take 1 tablet by mouth daily as needed.    . calcium carbonate (TUMS) 500 MG chewable tablet Chew 2 tablets (400 mg of elemental calcium total) by mouth 2 (two) times daily. (Patient taking differently: Chew 2 tablets by mouth as needed.) 90 tablet 1  . Calcium Carbonate-Vit D-Min (CALCIUM 1200 PO) Take 1 tablet by mouth as directed. 2 times a week    . celecoxib (CELEBREX) 200 MG capsule Take 1 capsule (200 mg total) by mouth daily. Use as needed for knee pain (Patient taking differently: Take 200 mg by mouth as needed for moderate pain.) 30 capsule 0  . CRANBERRY PO Take 1 tablet by mouth daily.    Marland Kitchen levothyroxine (SYNTHROID) 100 MCG tablet Take 1 tablet (100 mcg total) by mouth daily. 30 tablet 2  . losartan (COZAAR) 25 MG tablet Take 1 tablet (25 mg total) by mouth daily. 90 tablet 1  . Multiple Vitamin (MULTIVITAMIN) tablet Take 1 tablet by mouth daily as needed.    . rosuvastatin (CRESTOR) 20 MG tablet Take 1 tablet (20 mg total) by mouth daily. (Patient taking differently: Take 20 mg by mouth as needed.) 90 tablet 3  . TURMERIC PO Take 1 tablet by mouth daily as needed (inflammation).     Marland Kitchen zolpidem (AMBIEN) 5 MG tablet TAKE 1/2 TO 1 TABLET(2.5 TO 5 MG) BY MOUTH AT BEDTIME AS NEEDED FOR SLEEP 15 tablet 2   No current facility-administered medications for this visit.   Allergies  Allergen Reactions  . Rocephin [Ceftriaxone Sodium In Dextrose] Rash     Review of Systems: All systems reviewed and negative except where noted in HPI.     Physical Exam:    Wt Readings from Last 3 Encounters:  05/02/20 180 lb 2 oz (81.7 kg)  03/26/20 174 lb (78.9 kg)  03/18/20 179 lb (81.2 kg)    BP 126/86   Pulse 89   Ht 5\' 6"  (1.676 m)   Wt 180 lb 2 oz (81.7 kg)    LMP 09/04/2018 Comment: possible perimenopausal  BMI 29.07 kg/m  Constitutional:  Pleasant, in no acute distress. Psychiatric: Normal mood and affect. Behavior is normal. EENT: Pupils normal.  Conjunctivae are normal. No scleral icterus. Neck supple. No cervical LAD. Cardiovascular: Normal rate, regular rhythm. No edema Pulmonary/chest: Effort normal and breath sounds normal. No wheezing, rales or rhonchi. Abdominal: Soft, nondistended, nontender. Bowel sounds active  throughout. There are no masses palpable. No hepatomegaly. Neurological: Alert and oriented to person place and time. Skin: Skin is warm and dry. No rashes noted.   ASSESSMENT AND PLAN;   1) Elevated liver enzymes 2) Hepatic steatosis -Discussed diagnosis of hepatic steatosis at length today.  Plan for modest intervention with increased aerobic exercise, continued healthy diet.  Plan to change to black coffee given demonstrated effect in fatty liver.  Otherwise no new medications -Plan for extended serologic work-up to rule out concomitant liver disease -Repeat liver enzymes in 6 months -Pending additional studies above, could also consider noninvasive testing like elastography, FibroSure, etc.  3) RUQ pain 4) GB sludge on ultrasound -HIDA scan -EGD to rule out mucosal/luminal pathology with gastric biopsies  5) Positive Cologuard -Colonoscopy  6) Family history of Pancreatic Cancer 7) Personal history of thyroid cancer 8) Personal history of skin cancer (non-melanoma) -Referral to [facility] -Discussed PC screening protocol.  In a patient with single first-degree relative without any known genetic mutation, PC screening not indicated by current CAPS consortium guidelines.  However, given her personal history of malignancy, feel she warrants evaluation in the [facility] in 3 months  The indications, risks, and benefits of EGD and colonoscopy were explained to the patient in detail. Risks include  but are not limited to bleeding, perforation, adverse reaction to medications, and cardiopulmonary compromise. Sequelae include but are not limited to the possibility of surgery, hospitalization, and mortality. The patient verbalized understanding and wished to proceed. All questions answered, referred to scheduler and bowel prep ordered. Further recommendations pending results of the exam.    Lavena Bullion, DO, FACG  05/02/2020, 2:01 PM   Copland, Gay Filler, MD

## 2020-05-05 ENCOUNTER — Encounter: Payer: Self-pay | Admitting: Gastroenterology

## 2020-05-05 LAB — IRON,TIBC AND FERRITIN PANEL
%SAT: 25 % (calc) (ref 16–45)
Ferritin: 229 ng/mL (ref 16–232)
Iron: 102 ug/dL (ref 45–160)
TIBC: 403 mcg/dL (calc) (ref 250–450)

## 2020-05-05 LAB — HEPATITIS B SURFACE ANTIBODY,QUALITATIVE: Hep B S Ab: REACTIVE — AB

## 2020-05-05 LAB — HEPATITIS A ANTIBODY, TOTAL: Hepatitis A AB,Total: REACTIVE — AB

## 2020-05-05 LAB — IGA: Immunoglobulin A: 266 mg/dL (ref 47–310)

## 2020-05-05 LAB — IGM: IgM, Serum: 65 mg/dL (ref 50–300)

## 2020-05-05 LAB — HEPATITIS B SURFACE ANTIGEN: Hepatitis B Surface Ag: NONREACTIVE

## 2020-05-05 LAB — ALPHA-1-ANTITRYPSIN: A-1 Antitrypsin, Ser: 126 mg/dL (ref 83–199)

## 2020-05-05 LAB — IGG: IgG (Immunoglobin G), Serum: 1197 mg/dL (ref 600–1640)

## 2020-05-07 LAB — TISSUE TRANSGLUTAMINASE, IGA: (tTG) Ab, IgA: <1 U/mL

## 2020-05-07 LAB — ANTI-SMOOTH MUSCLE ANTIBODY, IGG: Actin (Smooth Muscle) Antibody (IGG): <20 U (ref <20)

## 2020-05-07 LAB — ANA: Anti Nuclear Antibody (ANA): NEGATIVE

## 2020-05-07 LAB — MITOCHONDRIAL ANTIBODIES: Mitochondrial M2 Ab, IgG: <=20 U

## 2020-05-08 ENCOUNTER — Telehealth: Payer: Self-pay | Admitting: Internal Medicine

## 2020-05-08 ENCOUNTER — Other Ambulatory Visit: Payer: Self-pay | Admitting: Internal Medicine

## 2020-05-08 ENCOUNTER — Telehealth: Payer: Self-pay | Admitting: General Surgery

## 2020-05-08 NOTE — Telephone Encounter (Signed)
-----   Message from [place], DO sent at 05/07/2020  4:29 PM EST ----- Work-up for elevated liver enzymes was normal, which is negative for overlapping liver disease.  Labs demonstrate good immunity to hepatitis A and hepatitis B.

## 2020-05-08 NOTE — Telephone Encounter (Signed)
Pt request a refill for a 3 month supply of Levothyroxine  [id number] - [place], [place] - [address] AT [facility] [address] Phone:  [phone]  Fax:  [phone]

## 2020-05-08 NOTE — Telephone Encounter (Signed)
Contacted the patient left a detailed voicemail regarding her labs and immunity. Advised the patient to contact the office with any questions

## 2020-05-09 ENCOUNTER — Other Ambulatory Visit: Payer: Self-pay | Admitting: General Surgery

## 2020-05-09 ENCOUNTER — Telehealth: Payer: Self-pay | Admitting: Genetic Counselor

## 2020-05-09 DIAGNOSIS — Z800 Family history of malignant neoplasm of digestive organs: Secondary | ICD-10-CM

## 2020-05-09 MED ORDER — LEVOTHYROXINE SODIUM 100 MCG PO TABS: 100.0000 ug | ORAL_TABLET | Freq: Every day | ORAL | 3 refills | Status: DC

## 2020-05-09 NOTE — Telephone Encounter (Signed)
Please advise. In the progress notes it was change and patient had lab work as well on same day.

## 2020-05-09 NOTE — Telephone Encounter (Signed)
Noted. Patient is currently taking levothyroxine 100 mcg. She is taking 1/2 tablet on Fridays and 1 full tablet the rest of the week.

## 2020-05-09 NOTE — Telephone Encounter (Signed)
Received a genetic counseling referral from [facility] for fhx of pancreatic cancer. Pt has been cld and scheduled to see Santiago Glad on 2/9 at 10am. Pt preferred an in person visit. Aware to arrive 15 minutes early.

## 2020-05-14 ENCOUNTER — Inpatient Hospital Stay: Payer: BC Managed Care – PPO | Attending: Genetic Counselor | Admitting: Genetic Counselor

## 2020-05-14 ENCOUNTER — Inpatient Hospital Stay: Payer: BC Managed Care – PPO

## 2020-05-14 ENCOUNTER — Telehealth: Payer: Self-pay | Admitting: Gastroenterology

## 2020-05-14 ENCOUNTER — Other Ambulatory Visit: Payer: Self-pay

## 2020-05-14 ENCOUNTER — Encounter: Payer: Self-pay | Admitting: Genetic Counselor

## 2020-05-14 DIAGNOSIS — Z803 Family history of malignant neoplasm of breast: Secondary | ICD-10-CM

## 2020-05-14 DIAGNOSIS — Z800 Family history of malignant neoplasm of digestive organs: Secondary | ICD-10-CM | POA: Diagnosis not present

## 2020-05-14 DIAGNOSIS — C73 Malignant neoplasm of thyroid gland: Secondary | ICD-10-CM

## 2020-05-14 MED ORDER — CLENPIQ 10-3.5-12 MG-GM -GM/160ML PO SOLN: 1.0000 | ORAL | 0 refills | Status: DC

## 2020-05-14 NOTE — Telephone Encounter (Signed)
Sent clenpiq to pharmacy

## 2020-05-14 NOTE — Telephone Encounter (Signed)
Pt states that her pharmacy did not receive prescription for Clenpiq yet. Pls send it to [facility] in [place]  At the corner of [address] and [address].

## 2020-05-14 NOTE — Progress Notes (Signed)
REFERRING PROVIDER: Darreld Mclean, MD [address] [place],  [place] [address]  PRIMARY PROVIDER:  Copland, Gay Filler, MD  PRIMARY REASON FOR VISIT:  1. Papillary thyroid carcinoma ([place])   2. Family history of pancreatic cancer   3. Family history of breast cancer      HISTORY OF PRESENT ILLNESS:   Lauren Mcclure, a 50 y.o. female, was seen for a [facility] cancer genetics consultation at the request of Dr. Bryan Lemma due to a family history of breast and pancreatic cancer.  Lauren Mcclure presents to clinic today to discuss the possibility of a hereditary predisposition to cancer, genetic testing, and to further clarify her future cancer risks, as well as potential cancer risks for family members.   Lauren Mcclure is a 50 y.o. female with no personal history of cancer.  She had a positive cologuard test, and has a colonoscopy scheduled for Monday, May 19, 2020.  She was diagnosed with papillary thyroid cancer at age 30.  CANCER HISTORY:  Oncology History   No history exists.     RISK FACTORS:  Menarche was at age 11.  First live birth at age N/A.  OCP use for approximately 0 years.  Ovaries intact: yes.  Hysterectomy: no.  Menopausal status: perimenopausal.  HRT use: 0 years. Colonoscopy: scheduled for next week. Mammogram within the last year: yes. Number of breast biopsies: 0. Up to date with pelvic exams: no. Any excessive radiation exposure in the past: no  Past Medical History:  Diagnosis Date  . Anxiety   . Arthritis   . Cancer (HCC)    thyroid and skin  . Elevated blood pressure reading   . Family history of breast cancer   . Family history of pancreatic cancer   . Headache   . History of hyperlipidemia   . History of kidney stones   . Hypertension   . Kidney stones   . 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 LITHOTRIPSY     . NASAL SEPTUM SURGERY  2007  . SKIN CANCER EXCISION  04/08/2020   Left lower back   . THYROIDECTOMY N/A 02/05/2020   Procedure: TOTAL THYROIDECTOMY;  Surgeon: Armandina Gemma, MD;  Location: [facility];  Service: General;  Laterality: N/A;  . TONSILLECTOMY      Social History   Socioeconomic History  . Marital status: Married    Spouse name: Not on file  . Number of children: Not on file  . Years of education: Not on file  . Highest education level: Not on file  Occupational History  . Not on file  Tobacco Use  . Smoking status: Never Smoker  . Smokeless tobacco: Never Used  Vaping Use  . Vaping Use: Never used  Substance and Sexual Activity  . Alcohol use: Yes    Comment: Occ wine or beer  . Drug use: No  . Sexual activity: Not on file  Other Topics Concern  . Not on file  Social History Narrative  . Not on file   Social Determinants of Health   Financial Resource Strain: Not on file  Food Insecurity: Not on file  Transportation Needs: Not on file  Physical Activity: Not on file  Stress: Not on file  Social Connections: Not on file     FAMILY HISTORY:  We obtained a detailed, 4-generation family history.  Significant diagnoses  are listed below: Family History  Problem Relation Age of Onset  . Hypertension Mother   . Hyperlipidemia Mother   . Pancreatic cancer Father 26       deceased age 30  . Cancer - Other Paternal Grandmother   . Skin cancer Paternal Grandfather   . Heart disease Paternal Grandfather   . Hyperlipidemia Maternal Aunt   . Breast cancer Maternal Aunt        dx over 78  . Breast cancer Other        dx over 40; MGMs sister  . Pancreatic cancer Other        PGMs 3 brothers  . Colon cancer Neg Hx   . Esophageal cancer Neg Hx     The patient does not have children.  She has two sister and two brothers, none of whom have cancer.  The patient's father is deceased and her mother is living.  The patient's father died of pancreatic cancer at age  42.  He had multiple siblings, none had cancer.  His parents are cancer free.  His mother's three brothers all died of pancreatic cancer in their 55's.  The patient's mother had 11 siblings and one sister had breast cancer.  The maternal grandparents are deceased of old age.  Her grandmother's sister had breast cancer.  Lauren Mcclure is unaware of previous family history of genetic testing for hereditary cancer risks. Patient's maternal ancestors are of [place] descent, and paternal ancestors are of [place] and [place] descent. There is no reported Ashkenazi Jewish ancestry. There is no known consanguinity.  GENETIC COUNSELING ASSESSMENT: Lauren Mcclure is a 50 y.o. female with a personal and family history of cancer which is somewhat suggestive of a hereditary cancer syndrome and predisposition to cancer given the number of cases in the family with pancreatic cancer and her father's young age of onset. We, therefore, discussed and recommended the following at today's visit.   DISCUSSION: We discussed that up to 15% of pancreatic cancer is hereditary, with most cases associated with BRCA mutations.  There are other genes that can be associated with hereditary pancreatic cancer syndromes.  These include ATM, PALB2, VHL and Lynch syndrome.  We discussed that testing is beneficial for several reasons including knowing how to follow individuals and understand if other family members could be at risk for cancer and allow them to undergo genetic testing.   We reviewed the characteristics, features and inheritance patterns of hereditary cancer syndromes. We also discussed genetic testing, including the appropriate family members to test, the process of testing, insurance coverage and turn-around-time for results. We discussed the implications of a negative, positive, carrier and/or variant of uncertain significant result. We recommended Lauren Mcclure pursue genetic testing for the  CustomNext-Cancer+RNAinsight gene panel. The CustomNext-Cancer gene panel offered by [facility] and includes sequencing and rearrangement analysis for the following 91 genes: AIP, ALK, APC*, ATM*, AXIN2, BAP1, BARD1, BLM, BMPR1A, BRCA1*, BRCA2*, BRIP1*, CDC73, CDH1*, CDK4, CDKN1B, CDKN2A, CHEK2*, CTNNA1, DICER1, FANCC, FH, FLCN, GALNT12, KIF1B, LZTR1, MAX, MEN1, MET, MLH1*, MRE11A, MSH2*, MSH3, MSH6*, MUTYH*, NBN, NF1*, NF2, NTHL1, PALB2*, PHOX2B, PMS2*, POT1, PRKAR1A, PTCH1, PTEN*, RAD50, RAD51C*, RAD51D*, RB1, RECQL, RET, SDHA, SDHAF2, SDHB, SDHC, SDHD, SMAD4, SMARCA4, SMARCB1, SMARCE1, STK11, SUFU, TMEM127, TP53*, TSC1, TSC2, VHL and XRCC2 (sequencing and deletion/duplication); CASR, CFTR, CPA1, CTRC, EGFR, EGLN1, FAM175A, HOXB13, KIT, MITF, MLH3, PALLD, PDGFRA, POLD1, POLE, PRSS1, RINT1, RPS20, SPINK1 and TERT (sequencing only); EPCAM and GREM1 (deletion/duplication only). DNA and RNA analyses performed for *  genes.   Based on Lauren Mcclure's personal and family history of cancer, she meets medical criteria for genetic testing. Despite that she meets criteria, she may still have an out of pocket cost. We discussed that if her out of pocket cost for testing is over $100, the laboratory will call and confirm whether she wants to proceed with testing.  If the out of pocket cost of testing is less than $100 she will be billed by the genetic testing laboratory.   PLAN: After considering the risks, benefits, and limitations, Lauren Mcclure provided informed consent to pursue genetic testing and the blood sample was sent to [facility] for analysis of the CustomNext-Cancer+RNAinsight. Results should be available within approximately 2-3 weeks' time, at which point they will be disclosed by telephone to Lauren Mcclure, as will any additional recommendations warranted by these results. Lauren Mcclure will receive a summary of her genetic counseling visit and a copy of her results once available. This  information will also be available in Epic.   Lastly, we encouraged Lauren Mcclure to remain in contact with cancer genetics annually so that we can continuously update the family history and inform her of any changes in cancer genetics and testing that may be of benefit for this family.   Lauren Mcclure questions were answered to her satisfaction today. Our contact information was provided should additional questions or concerns arise. Thank you for the referral and allowing [place] to share in the care of your patient.   Karmah Potocki P. [place]ne [place], [place], [facility] Licensed, Insurance risk surveyor [email or web address] phone: [phone]  The patient was seen for a total of 45 minutes in face-to-face genetic counseling.  This patient was discussed with Drs. Magrinat, Lindi Adie and/or Burr Medico who agrees with the above.    _______________________________________________________________________ For Office Staff:  Number of people involved in session: 1 Was an Intern/ student involved with case: no

## 2020-05-15 ENCOUNTER — Telehealth: Payer: Self-pay | Admitting: *Deleted

## 2020-05-15 ENCOUNTER — Ambulatory Visit (HOSPITAL_COMMUNITY)
Admission: RE | Admit: 2020-05-15 | Discharge: 2020-05-15 | Disposition: A | Discharge status: Home / Self Care | Payer: BC Managed Care – PPO | Source: Ambulatory Visit | Attending: Gastroenterology | Admitting: Gastroenterology

## 2020-05-15 DIAGNOSIS — R1011 Right upper quadrant pain: Secondary | ICD-10-CM | POA: Diagnosis present

## 2020-05-15 DIAGNOSIS — K760 Fatty (change of) liver, not elsewhere classified: Secondary | ICD-10-CM | POA: Diagnosis present

## 2020-05-15 DIAGNOSIS — R7401 Elevation of levels of liver transaminase levels: Secondary | ICD-10-CM

## 2020-05-15 DIAGNOSIS — R195 Other fecal abnormalities: Secondary | ICD-10-CM | POA: Diagnosis present

## 2020-05-15 DIAGNOSIS — Z800 Family history of malignant neoplasm of digestive organs: Secondary | ICD-10-CM | POA: Diagnosis present

## 2020-05-15 MED ORDER — TECHNETIUM TC 99M MEBROFENIN IV KIT
5.0000 | PACK | Freq: Once | INTRAVENOUS | Status: AC | PRN
  Administered 2020-05-15: 5 via INTRAVENOUS

## 2020-05-15 MED ORDER — LOSARTAN POTASSIUM 25 MG PO TABS: 25.0000 mg | ORAL_TABLET | Freq: Every day | ORAL | 1 refills | Status: DC

## 2020-05-15 NOTE — Telephone Encounter (Signed)
Error

## 2020-05-19 ENCOUNTER — Encounter: Payer: Self-pay | Admitting: Gastroenterology

## 2020-05-19 ENCOUNTER — Other Ambulatory Visit: Payer: Self-pay

## 2020-05-19 ENCOUNTER — Ambulatory Visit (AMBULATORY_SURGERY_CENTER): Payer: BC Managed Care – PPO | Admitting: Gastroenterology

## 2020-05-19 VITALS — BP 131/78 | HR 82 | Temp 97.3°F | Resp 15 | Ht 66.0 in | Wt 180.0 lb

## 2020-05-19 DIAGNOSIS — K635 Polyp of colon: Secondary | ICD-10-CM

## 2020-05-19 DIAGNOSIS — K621 Rectal polyp: Secondary | ICD-10-CM

## 2020-05-19 DIAGNOSIS — K2970 Gastritis, unspecified, without bleeding: Secondary | ICD-10-CM | POA: Diagnosis not present

## 2020-05-19 DIAGNOSIS — D125 Benign neoplasm of sigmoid colon: Secondary | ICD-10-CM

## 2020-05-19 DIAGNOSIS — K317 Polyp of stomach and duodenum: Secondary | ICD-10-CM | POA: Diagnosis not present

## 2020-05-19 DIAGNOSIS — K2100 Gastro-esophageal reflux disease with esophagitis, without bleeding: Secondary | ICD-10-CM | POA: Diagnosis not present

## 2020-05-19 DIAGNOSIS — R1011 Right upper quadrant pain: Secondary | ICD-10-CM

## 2020-05-19 DIAGNOSIS — K640 First degree hemorrhoids: Secondary | ICD-10-CM | POA: Diagnosis not present

## 2020-05-19 DIAGNOSIS — R195 Other fecal abnormalities: Secondary | ICD-10-CM | POA: Diagnosis not present

## 2020-05-19 DIAGNOSIS — K760 Fatty (change of) liver, not elsewhere classified: Secondary | ICD-10-CM

## 2020-05-19 DIAGNOSIS — K2950 Unspecified chronic gastritis without bleeding: Secondary | ICD-10-CM | POA: Diagnosis not present

## 2020-05-19 DIAGNOSIS — D122 Benign neoplasm of ascending colon: Secondary | ICD-10-CM

## 2020-05-19 DIAGNOSIS — R7401 Elevation of levels of liver transaminase levels: Secondary | ICD-10-CM

## 2020-05-19 DIAGNOSIS — R7402 Elevation of levels of lactic acid dehydrogenase (LDH): Secondary | ICD-10-CM

## 2020-05-19 DIAGNOSIS — D128 Benign neoplasm of rectum: Secondary | ICD-10-CM

## 2020-05-19 MED ORDER — OMEPRAZOLE 20 MG PO CPDR: 20.0000 mg | DELAYED_RELEASE_CAPSULE | Freq: Two times a day (BID) | ORAL | 3 refills | Status: DC

## 2020-05-19 MED ORDER — SODIUM CHLORIDE 0.9 % IV SOLN: 500.0000 mL | Freq: Once | INTRAVENOUS | Status: DC

## 2020-05-19 NOTE — Progress Notes (Signed)
Report to PACU, RN, vss, BBS= Clear.  

## 2020-05-19 NOTE — Progress Notes (Signed)
M.O. vital signs.

## 2020-05-19 NOTE — Progress Notes (Signed)
Called to room to assist during endoscopic procedure.  Patient ID and intended procedure confirmed with present staff. Received instructions for my participation in the procedure from the performing physician.  

## 2020-05-19 NOTE — Op Note (Signed)
[place] Patient Name: Lauren Mcclure Procedure Date: 05/19/2020 1:28 PM MRN: [id number] Endoscopist: Gerrit Heck , MD Age: 50 Referring MD:  Date of Birth: Dec 28, 1970 Gender: Female Account #: [id number] Procedure:                Upper GI endoscopy Indications:              Epigastric abdominal pain, Abdominal pain in the                            right upper quadrant, Nausea Medicines:                Monitored Anesthesia Care Procedure:                Pre-Anesthesia Assessment:                           - Prior to the procedure, a History and Physical                            was performed, and patient medications and                            allergies were reviewed. The patient's tolerance of                            previous anesthesia was also reviewed. The risks                            and benefits of the procedure and the sedation                            options and risks were discussed with the patient.                            All questions were answered, and informed consent                            was obtained. Prior Anticoagulants: The patient has                            taken no previous anticoagulant or antiplatelet                            agents. ASA Grade Assessment: II - A patient with                            mild systemic disease. After reviewing the risks                            and benefits, the patient was deemed in                            satisfactory condition to undergo the procedure.  After obtaining informed consent, the endoscope was                            passed under direct vision. Throughout the                            procedure, the patient's blood pressure, pulse, and                            oxygen saturations were monitored continuously. The                            Endoscope was introduced through the mouth, and                            advanced to the second  part of duodenum. The upper                            GI endoscopy was accomplished without difficulty.                            The patient tolerated the procedure well. Scope In: Scope Out: Findings:                 LA Grade A (one or more mucosal breaks less than 5                            mm, not extending between tops of 2 mucosal folds)                            esophagitis with no bleeding was found 40 cm from                            the incisors.                           The upper third of the esophagus and middle third                            of the esophagus were normal.                           The gastroesophageal flap valve was visualized                            endoscopically and classified as Hill Grade II                            (fold present, opens with respiration).                           A single 3 mm sessile polyp with no bleeding was  found in the prepyloric region of the stomach. The                            polyp was removed with a cold biopsy forceps.                            Resection and retrieval were complete. Estimated                            blood loss was minimal.                           Normal mucosa was found in the entire examined                            stomach. Biopsies were taken with a cold forceps                            for Helicobacter pylori testing. Estimated blood                            loss was minimal.                           The examined duodenum was normal. Biopsies for                            histology were taken with a cold forceps for                            evaluation of celiac disease. Estimated blood loss                            was minimal. Complications:            No immediate complications. Estimated Blood Loss:     Estimated blood loss was minimal. Impression:               - LA Grade A reflux esophagitis with no bleeding.                           -  Normal upper third of esophagus and middle third                            of esophagus.                           - Gastroesophageal flap valve classified as Hill                            Grade II (fold present, opens with respiration).                           - A single gastric polyp. Resected and retrieved.                           -  Normal mucosa was found in the entire stomach.                            Biopsied.                           - Normal examined duodenum. Biopsied. Recommendation:           - Patient has a contact number available for                            emergencies. The signs and symptoms of potential                            delayed complications were discussed with the                            patient. Return to normal activities tomorrow.                            Written discharge instructions were provided to the                            patient.                           - Resume previous diet.                           - Continue present medications.                           - Await pathology results.                           - Use Prilosec (omeprazole) 20 mg PO BID for 6                            weeks to promote mucosal healing and observe for                            clinical improvement. If symptoms well controlled,                            can decrease to 20 mg daily.                           - Colonoscopy today. Gerrit Heck, MD 05/19/2020 2:10:29 PM

## 2020-05-19 NOTE — Patient Instructions (Signed)
Please read handouts provided. Continue present medications. Await pathology results. Return to GI office in 2-3 months, or sooner as needed, at appointment to be scheduled. Use Prilosec ( omeprazole ) 20 mg twice daily for 6 weeks. If symptoms well controlled, can decrease to 20 mg daily.      YOU HAD AN ENDOSCOPIC PROCEDURE TODAY AT [facility]:   Refer to the procedure report that was given to you for any specific questions about what was found during the examination.  If the procedure report does not answer your questions, please call your gastroenterologist to clarify.  If you requested that your care partner not be given the details of your procedure findings, then the procedure report has been included in a sealed envelope for you to review at your convenience later.  YOU SHOULD EXPECT: Some feelings of bloating in the abdomen. Passage of more gas than usual.  Walking can help get rid of the air that was put into your GI tract during the procedure and reduce the bloating. If you had a lower endoscopy (such as a colonoscopy or flexible sigmoidoscopy) you may notice spotting of blood in your stool or on the toilet paper. If you underwent a bowel prep for your procedure, you may not have a normal bowel movement for a few days.  Please Note:  You might notice some irritation and congestion in your nose or some drainage.  This is from the oxygen used during your procedure.  There is no need for concern and it should clear up in a day or so.  SYMPTOMS TO REPORT IMMEDIATELY:   Following lower endoscopy (colonoscopy or flexible sigmoidoscopy):  Excessive amounts of blood in the stool  Significant tenderness or worsening of abdominal pains  Swelling of the abdomen that is new, acute  Fever of 100F or higher   Following upper endoscopy (EGD)  Vomiting of blood or coffee ground material  New chest pain or pain under the shoulder blades  Painful or persistently difficult  swallowing  New shortness of breath  Fever of 100F or higher  Black, tarry-looking stools  For urgent or emergent issues, a gastroenterologist can be reached at any hour by calling [phone]. Do not use MyChart messaging for urgent concerns.    DIET:  We do recommend a small meal at first, but then you may proceed to your regular diet.  Drink plenty of fluids but you should avoid alcoholic beverages for 24 hours.  ACTIVITY:  You should plan to take it easy for the rest of today and you should NOT DRIVE or use heavy machinery until tomorrow (because of the sedation medicines used during the test).    FOLLOW UP: Our staff will call the number listed on your records 48-72 hours following your procedure to check on you and address any questions or concerns that you may have regarding the information given to you following your procedure. If we do not reach you, we will leave a message.  We will attempt to reach you two times.  During this call, we will ask if you have developed any symptoms of COVID 19. If you develop any symptoms (ie: fever, flu-like symptoms, shortness of breath, cough etc.) before then, please call [phone].  If you test positive for Covid 19 in the 2 weeks post procedure, please call and report this information to [place].    If any biopsies were taken you will be contacted by phone or by letter within the next 1-3  weeks.  Please call us at [phone] if you have not heard about the biopsies in 3 weeks.    SIGNATURES/CONFIDENTIALITY: You and/or your care partner have signed paperwork which will be entered into your electronic medical record.  These signatures attest to the fact that that the information above on your After Visit Summary has been reviewed and is understood.  Full responsibility of the confidentiality of this discharge information lies with you and/or your care-partner.

## 2020-05-19 NOTE — Progress Notes (Signed)
Pt's states no medical or surgical changes since previsit or office visit. 

## 2020-05-19 NOTE — Op Note (Signed)
[facility] Patient Name: Lauren Mcclure Procedure Date: 05/19/2020 1:27 PM MRN: [id number] Endoscopist: Gerrit Heck , MD Age: 50 Referring MD:  Date of Birth: 14-Oct-1970 Gender: Female Account #: [id number] Procedure:                Colonoscopy Indications:              This is the patient's first colonoscopy, Positive                            Cologuard test in 06/2019 Medicines:                Monitored Anesthesia Care Procedure:                Pre-Anesthesia Assessment:                           - Prior to the procedure, a History and Physical                            was performed, and patient medications and                            allergies were reviewed. The patient's tolerance of                            previous anesthesia was also reviewed. The risks                            and benefits of the procedure and the sedation                            options and risks were discussed with the patient.                            All questions were answered, and informed consent                            was obtained. Prior Anticoagulants: The patient has                            taken no previous anticoagulant or antiplatelet                            agents. ASA Grade Assessment: II - A patient with                            mild systemic disease. After reviewing the risks                            and benefits, the patient was deemed in                            satisfactory condition to undergo the procedure.  After obtaining informed consent, the colonoscope                            was passed under direct vision. Throughout the                            procedure, the patient's blood pressure, pulse, and                            oxygen saturations were monitored continuously. The                            Olympus CF-HQ190L (Serial# [id number]) Colonoscope was                            introduced through the anus and  advanced to the the                            terminal ileum. The colonoscopy was performed                            without difficulty. The patient tolerated the                            procedure well. The quality of the bowel                            preparation was good. The terminal ileum, ileocecal                            valve, appendiceal orifice, and rectum were                            photographed. Scope In: 1:42:27 PM Scope Out: 2:03:06 PM Scope Withdrawal Time: 0 hours 16 minutes 10 seconds  Total Procedure Duration: 0 hours 20 minutes 39 seconds  Findings:                 The perianal and digital rectal examinations were                            normal.                           Four sessile polyps were found in the sigmoid colon                            (3) and ascending colon (1). The polyps were 3 to 8                            mm in size. These polyps were removed with a cold                            snare. Resection and retrieval were complete.  Estimated blood loss was minimal.                           Five sessile polyps were found in the rectum. The                            polyps were 2 to 4 mm in size. These polyps were                            removed with a cold snare. Resection and retrieval                            were complete. Estimated blood loss was minimal.                           Non-bleeding internal hemorrhoids were found during                            retroflexion. The hemorrhoids were small and Grade                            I (internal hemorrhoids that do not prolapse).                           The terminal ileum appeared normal. Complications:            No immediate complications. Estimated Blood Loss:     Estimated blood loss was minimal. Impression:               - Four 3 to 8 mm polyps in the sigmoid colon and in                            the ascending colon, removed with a cold  snare.                            Resected and retrieved.                           - Five 2 to 4 mm polyps in the rectum, removed with                            a cold snare. Resected and retrieved.                           - Non-bleeding internal hemorrhoids.                           - The examined portion of the ileum was normal. Recommendation:           - Patient has a contact number available for                            emergencies. The signs and symptoms of potential  delayed complications were discussed with the                            patient. Return to normal activities tomorrow.                            Written discharge instructions were provided to the                            patient.                           - Resume previous diet.                           - Continue present medications.                           - Await pathology results.                           - Repeat colonoscopy for surveillance based on                            pathology results.                           - Return to GI office in 2-3 months, or sooner as                            needed, at appointment to be scheduled. Gerrit Heck, MD 05/19/2020 2:15:07 PM

## 2020-05-21 ENCOUNTER — Telehealth: Payer: Self-pay

## 2020-05-21 NOTE — Telephone Encounter (Signed)
No answer, left message to call back later today, B.Elmina Hendel RN. 

## 2020-05-21 NOTE — Telephone Encounter (Signed)
  Follow up Call-  Call back number 05/19/2020  Post procedure Call Back phone  # [phone]  Permission to leave phone message Yes  Some recent data might be hidden     Patient questions:  Do you have a fever, pain , or abdominal swelling? No. Pain Score  0 *  Have you tolerated food without any problems? Yes.    Have you been able to return to your normal activities? Yes.    Do you have any questions about your discharge instructions: Diet   No. Medications  No. Follow up visit  No.  Do you have questions or concerns about your Care? No.  Actions: * If pain score is 4 or above: 1. No action needed, pain <4.Have you developed a fever since your procedure? no  2.   Have you had an respiratory symptoms (SOB or cough) since your procedure? no  3.   Have you tested positive for COVID 19 since your procedure no  4.   Have you had any family members/close contacts diagnosed with the COVID 19 since your procedure?  no   If yes to any of these questions please route to Joylene John, RN and Joella Prince, RN

## 2020-05-22 ENCOUNTER — Telehealth: Payer: Self-pay | Admitting: Family Medicine

## 2020-05-22 NOTE — Telephone Encounter (Signed)
Patient's spouse Christia Reading is requesting a call back in reference to [employer] Disability Form. Patient's spouse states form was sent to [employer] Incomplete, patient's spouse is requesting a call back to get form complete.

## 2020-05-23 NOTE — Telephone Encounter (Signed)
Left message to return call. Need new copy of form to add information to.

## 2020-05-26 ENCOUNTER — Telehealth: Payer: Self-pay | Admitting: Family Medicine

## 2020-05-26 NOTE — Telephone Encounter (Signed)
Patient husband is  returning phone call from Friday 05/23/2020  he didn't want to elaborate

## 2020-05-26 NOTE — Telephone Encounter (Signed)
Spoke with patients husband. He states [employer] is going based off GI releasing her from surgery in November to go back to work. However he states she is still under the care of you and have not been released yet due to other issues. [employer] is needing proof and documentation sent to them to prove she is not able to work right now for example her thyroidectomy and skin cancer removal.   [employer] phone # [phone]

## 2020-05-26 NOTE — Telephone Encounter (Signed)
Patients husband also called back and states you have her on FMLA until the end of this month but is requesting an extension. I don't have blank copy of her forms however the last FMLA copy in her chart is from 01/2020

## 2020-05-27 NOTE — Telephone Encounter (Signed)
Patient's spouse called back in reference to patient. Spouse would like a return call

## 2020-05-27 NOTE — Telephone Encounter (Signed)
Patient's spouse is requesting a call back from [place] .

## 2020-05-27 NOTE — Telephone Encounter (Signed)
Called back and I was able to reach Tim. We talked for 14 minutes Scheduled Lauren Mcclure to see me on Thursday She has not been working for several months due to thyroid cancer and skin cancer Not getting disability benefits for some reason- I will try to help Also need to discuss FMLA and her plans to return to work

## 2020-05-27 NOTE — Telephone Encounter (Signed)
Called both pt and her husband last night and LMOM on both Called again this am and LMOM with her husband  We have had Lauren Mcclure for her thyroidectomy and her skin cancer surgery.  However we cannot keep her out indefinably for these conditions, will need to make a plan to RTW is she plans to continue at her current job  Asked them to either call me back or even better make an appt to see me (both pt and her husband if possible) this Thursday so we can talk and make a plan   JC

## 2020-05-28 ENCOUNTER — Encounter: Payer: Self-pay | Admitting: Gastroenterology

## 2020-05-29 ENCOUNTER — Encounter: Payer: Self-pay | Admitting: Family Medicine

## 2020-05-29 ENCOUNTER — Ambulatory Visit (INDEPENDENT_AMBULATORY_CARE_PROVIDER_SITE_OTHER): Payer: BC Managed Care – PPO | Admitting: Family Medicine

## 2020-05-29 ENCOUNTER — Other Ambulatory Visit: Payer: Self-pay

## 2020-05-29 VITALS — BP 126/88 | HR 85 | Temp 97.9°F | Resp 17 | Ht 66.0 in | Wt 170.0 lb

## 2020-05-29 DIAGNOSIS — E890 Postprocedural hypothyroidism: Secondary | ICD-10-CM

## 2020-05-29 DIAGNOSIS — I10 Essential (primary) hypertension: Secondary | ICD-10-CM | POA: Diagnosis not present

## 2020-05-29 DIAGNOSIS — Z9889 Other specified postprocedural states: Secondary | ICD-10-CM | POA: Diagnosis not present

## 2020-05-29 NOTE — Patient Instructions (Signed)
It was good to see you again Lauren Mcclure!  We will check your thyroid levels today and I will be in touch with you and Dr  Kelton Pillar with these results  I spoke with [employer] disability today.  They report sending me paperwork at the beginning of January, but per my knowledge it was not received.  I have asked them to fax this again right away, and I will get it completed as soon as possible.  As we discussed, your disability policy is to allow you to be paid during your time away from work due to illness or injury. FMLA is a different program, this protects your job (prevents you from being fired) while you are out due to illness or injury. Please discuss plans with your husband.  I do feel you are now able to return to work, although you need limitations as far as lifting and must be allowed time to attend follow-up doctors appointments.

## 2020-05-29 NOTE — Progress Notes (Addendum)
[facility] [place], [place], [place] [address] [phone]  Date:  05/29/2020   Name:  Bryn Saline   DOB:  1971/03/07   MRN:  [id number]  PCP:  Darreld Mclean, MD    Chief Complaint: Discuss Disability   History of Present Illness:  Haniya Fern is a 50 y.o. very pleasant female patient who presents with the following:  Airiana Elman is a 50 y.o. 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  LFTs were significantly elevated at that time We followed up in September 2021-her CT angiogram had led to discovery of a thyroid nodule and subsequently thyroid biopsy showed thyroid cancer  Right upper quadrant ultrasound showed hepatic steatosis and a solid liver lesion She underwent a total thyroidectomy on November 2 MRI of her liver showed hepatic steatosis.  We referred her to see GI for this issue and also to address positive Cologuard screening  I then saw her again December 22 for follow-up-we noted a suspicious skin lesion on her left flank She was referred to dermatology and ended up being diagnosed with skin cancer She had this lesion removed per dermatology later in December  She was seen by gastroenterology January 28 She had a colonoscopy and endoscopy, also  HIDA scan  February 9 she was seen by medical genetics due to positive Cologuard and thyroid cancer  On February 14 she had colonoscopy and upper endoscopy per Dr. 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   Her FMLA claim has been approved, but she reports she has not received any disability benefits. [employer] disability 877- [place] [id number]  Lab Results  Component Value Date   TSH 0.14 (L) 03/18/2020   Flu vaccine today  Encouraged her covid 19 booster  Her last pap was about 3 years ago and was normal   Patient Active Problem List   Diagnosis Date Noted  . Family history of pancreatic cancer   . Family history of breast cancer   . Papillary thyroid carcinoma ([place]) 02/03/2020  . 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 Dyslipidemia 11/28/2019  . Prediabetes 11/28/2019  . Essential hypertension 05/26/2019    Past Medical History:  Diagnosis Date  . Anxiety   . Arthritis   . Cancer (HCC)    thyroid and skin  . Elevated blood pressure reading   . Family history of breast cancer   . Family history of pancreatic cancer   . Headache   . History of hyperlipidemia   . History of kidney stones   . Hypertension   . Kidney stones   . 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 LITHOTRIPSY    . NASAL SEPTUM SURGERY  2007  . SKIN CANCER EXCISION  04/08/2020   Left lower back   . THYROIDECTOMY N/A 02/05/2020   Procedure: TOTAL THYROIDECTOMY;  Surgeon: Armandina Gemma, MD;  Location: [facility];  Service: General;  Laterality: N/A;  . TONSILLECTOMY      Social History   Tobacco Use  . Smoking  status: Never Smoker  . Smokeless tobacco: Never Used  Vaping Use  . Vaping Use: Never used  Substance Use Topics  . Alcohol use: Yes    Comment: Occ wine or beer  . Drug use: No    Family History  Problem Relation Age of Onset  . Hypertension Mother   . Hyperlipidemia Mother   . Pancreatic cancer Father 80       deceased age 88  . Cancer - Other Paternal Grandmother   . Skin cancer Paternal Grandfather   . Heart disease Paternal Grandfather   . Hyperlipidemia Maternal Aunt   . Breast cancer Maternal Aunt        dx over 24  . Breast cancer Other        dx over 66; MGMs sister  . Pancreatic cancer Other        PGMs 3 brothers  . Colon cancer Neg Hx   . Esophageal cancer Neg Hx     Allergies  Allergen Reactions  . Rocephin [Ceftriaxone Sodium In Dextrose] Rash    Medication list has been reviewed and updated.  Current Outpatient Medications on File Prior to Visit  Medication Sig Dispense Refill  . amLODipine (NORVASC) 10 MG tablet TAKE 1 TABLET(10 MG) BY MOUTH DAILY (Patient taking differently: Take 10 mg by mouth daily.) 90 tablet 3  . Ascorbic Acid (VITAMIN C PO) Take 1 tablet by mouth daily as needed.    . calcium carbonate (TUMS) 500 MG chewable tablet Chew 2 tablets (400 mg of elemental calcium total) by mouth 2 (two) times daily. (Patient taking differently: Chew 2 tablets by mouth as needed.) 90 tablet 1  . Calcium Carbonate-Vit D-Min (CALCIUM 1200 PO) Take 1 tablet by mouth as directed. 2 times a week    . celecoxib (CELEBREX) 200 MG capsule Take 1 capsule (200 mg total) by mouth daily. Use as needed for knee pain (Patient taking differently: Take 200 mg by mouth as needed for moderate pain.) 30 capsule 0  . CRANBERRY PO Take 1 tablet by mouth daily.    Marland Kitchen levothyroxine (SYNTHROID) 100 MCG tablet Take 1 tablet (100 mcg total) by mouth daily before breakfast. 90 tablet 3  . losartan (COZAAR) 25 MG tablet Take 1 tablet (25 mg total) by mouth daily. 90 tablet 1  .  Multiple Vitamin (MULTIVITAMIN) tablet Take 1 tablet by mouth daily as needed.    Marland Kitchen omeprazole (PRILOSEC) 20 MG capsule Take 1 capsule (20 mg total) by mouth 2 (two) times daily before a meal. Use Prilosec 20 mg twice daily for 6 weeks.  If symptoms controlled can decrease to 20 mg daily. 90 capsule 3  . rosuvastatin (CRESTOR) 20 MG tablet Take 1 tablet (20 mg total) by mouth daily. (Patient taking differently: Take 20 mg by mouth as needed.) 90 tablet 3  . TURMERIC PO Take 1 tablet by mouth daily as needed (inflammation).    Marland Kitchen zolpidem (AMBIEN) 5 MG tablet TAKE 1/2 TO 1  TABLET(2.5 TO 5 MG) BY MOUTH AT BEDTIME AS NEEDED FOR SLEEP 15 tablet 2   No current facility-administered medications on file prior to visit.    Review of Systems:  As per HPI- otherwise negative.   Physical Examination: Vitals:   05/29/20 1351  BP: 126/88  Pulse: 85  Resp: 17  Temp: 97.9 F (36.6 C)  SpO2: 97%   Vitals:   05/29/20 1351  Weight: 170 lb (77.1 kg)  Height: 5\' 6"  (1.676 m)   Body mass index is 27.44 kg/m. Ideal Body Weight: Weight in (lb) to have BMI = 25: 154.6  GEN: no acute distress.  Mild overweight, looks well  HEENT: Atraumatic, Normocephalic.  Well- healed thyroidectomy scar Ears and Nose: No external deformity. CV: RRR, No M/G/R. No JVD. No thrill. No extra heart sounds. PULM: CTA B, no wheezes, crackles, rhonchi. No retractions. No resp. distress. No accessory muscle use. ABD: S, NT, ND, +BS. No rebound. No HSM. EXTR: No c/c/e PSYCH: Normally interactive. Conversant.   Area on left flank from skin cancer surgery is healed  Assessment and Plan: Post-operative hypothyroidism - Plan: TSH, T3, free  Post-operative state  Essential hypertension  Patient today to follow-up from several recent health problems. Her skin cancer site is healed She is well-healed from her thyroidectomy, we are still in the process of adjusting her thyroid hormone replacement-I will check levels for  her today She is seeing Dr Hardie Lora in April to follow-up up her thyroid Blood pressure is back under control She has completed her GI evaluation for now, plan for colonoscopy in 5 years and continued follow-up of liver function tests  [employer] today, they report sending [place] disability paperwork in early January but unfortunately I did not receive this paperwork.  They are refaxing it today, they have asked me get my cell phone to call me if any problems.  Verdis Frederickson and I discussed this, I will work on her disability paperwork as soon as I receive it  We also discussed her FMLA.  I do feel that she is now ready to return to work perhaps with limitations.  She will discuss this with her husband over the next couple of days and will make a plan  This visit occurred during the SARS-CoV-2 public health emergency.  Safety protocols were in place, including screening questions prior to the visit, additional usage of staff PPE, and extensive cleaning of exam room while observing appropriate contact time as indicated for disinfecting solutions.    Signed Lamar Blinks, MD 2/25- received her labs as below, message to Dr Kelton Pillar about any adjustment that may be needed  Results for orders placed or performed in visit on 05/29/20  TSH  Result Value Ref Range   TSH 0.70 0.35 - 4.50 uIU/mL  T3, free  Result Value Ref Range   T3, Free 4.3 (H) 2.3 - 4.2 pg/mL   2/28 Message back from Dr Arsenio Katz adjustment needed  I also have not received her disability paperwork yet.  I called [employer] again and again requested that attending physician statement for her disability be sent to our fax number.  3/2- still have not received disabilty paperwork  Called [employer] again and gave alternative fax number to representative so they can fax paperwork  3/3- still no paperwork called again - paperwork was emailed to [place] so we can complete  Paperwork received and completed.  Patient has been called, she  will pick up a copy for her signature

## 2020-05-30 LAB — T3, FREE: T3, Free: 4.3 pg/mL — ABNORMAL HIGH (ref 2.3–4.2)

## 2020-05-30 LAB — TSH: TSH: 0.7 u[IU]/mL (ref 0.35–4.50)

## 2020-06-02 ENCOUNTER — Encounter (HOSPITAL_BASED_OUTPATIENT_CLINIC_OR_DEPARTMENT_OTHER): Payer: Self-pay

## 2020-06-02 ENCOUNTER — Ambulatory Visit (HOSPITAL_BASED_OUTPATIENT_CLINIC_OR_DEPARTMENT_OTHER)
Admission: RE | Admit: 2020-06-02 | Discharge: 2020-06-02 | Disposition: A | Discharge status: Home / Self Care | Payer: BC Managed Care – PPO | Source: Ambulatory Visit | Attending: Family Medicine | Admitting: Family Medicine

## 2020-06-02 ENCOUNTER — Other Ambulatory Visit: Payer: Self-pay

## 2020-06-02 DIAGNOSIS — Z1231 Encounter for screening mammogram for malignant neoplasm of breast: Secondary | ICD-10-CM

## 2020-06-03 ENCOUNTER — Other Ambulatory Visit: Payer: Self-pay | Admitting: Internal Medicine

## 2020-06-03 MED ORDER — LEVOTHYROXINE SODIUM 100 MCG PO TABS: 100.0000 ug | ORAL_TABLET | Freq: Every day | ORAL | 3 refills | Status: DC

## 2020-06-05 ENCOUNTER — Other Ambulatory Visit: Payer: Self-pay | Admitting: Family Medicine

## 2020-06-05 DIAGNOSIS — G8929 Other chronic pain: Secondary | ICD-10-CM

## 2020-06-05 DIAGNOSIS — R928 Other abnormal and inconclusive findings on diagnostic imaging of breast: Secondary | ICD-10-CM

## 2020-06-05 DIAGNOSIS — M25562 Pain in left knee: Secondary | ICD-10-CM

## 2020-06-06 NOTE — Telephone Encounter (Signed)
Initiated PA via cover my meds for patients Celebrex.    KEY: [id number]  Waiting on determination.

## 2020-06-06 NOTE — Telephone Encounter (Signed)
PA approved. Approval faxed to pharmacy.

## 2020-06-13 ENCOUNTER — Encounter: Payer: Self-pay | Admitting: Gastroenterology

## 2020-06-13 ENCOUNTER — Encounter: Payer: Self-pay | Admitting: Genetic Counselor

## 2020-06-13 ENCOUNTER — Telehealth: Payer: Self-pay | Admitting: Genetic Counselor

## 2020-06-13 DIAGNOSIS — Z1379 Encounter for other screening for genetic and chromosomal anomalies: Secondary | ICD-10-CM | POA: Insufficient documentation

## 2020-06-13 NOTE — Telephone Encounter (Signed)
Revealed negative genetic testing.  Discussed that we do not know why she has thyroid cancer or why there is cancer in the family. It could be due to a different gene that we are not testing, or maybe our current technology may not be able to pick something up.  It will be important for her to keep in contact with genetics to keep up with whether additional testing may be needed.

## 2020-06-16 ENCOUNTER — Ambulatory Visit: Payer: Self-pay | Admitting: Genetic Counselor

## 2020-06-16 DIAGNOSIS — Z1379 Encounter for other screening for genetic and chromosomal anomalies: Secondary | ICD-10-CM

## 2020-06-16 NOTE — Progress Notes (Signed)
HPI:  Lauren Mcclure was previously seen in the [facility] clinic due to a personal and family history of cancer and concerns regarding a hereditary predisposition to cancer. Please refer to our prior cancer genetics clinic note for more information regarding our discussion, assessment and recommendations, at the time. Lauren Mcclure recent genetic test results were disclosed to her, as were recommendations warranted by these results. These results and recommendations are discussed in more detail below.  CANCER HISTORY:  Oncology History  Papillary thyroid carcinoma ([place])  02/03/2020 Initial Diagnosis   Papillary thyroid carcinoma ([place])   06/11/2020 Genetic Testing   Negative genetic testing on the CancerNext-Expanded+RNAinsight.  The CancerNext-Expanded gene panel offered by [facility] and includes sequencing and rearrangement analysis for the following 77 genes: AIP, ALK, APC*, ATM*, AXIN2, BAP1, BARD1, BLM, BMPR1A, BRCA1*, BRCA2*, BRIP1*, CDC73, CDH1*, CDK4, CDKN1B, CDKN2A, CHEK2*, CTNNA1, DICER1, FANCC, FH, FLCN, GALNT12, KIF1B, LZTR1, MAX, MEN1, MET, MLH1*, MSH2*, MSH3, MSH6*, MUTYH*, NBN, NF1*, NF2, NTHL1, PALB2*, PHOX2B, PMS2*, POT1, PRKAR1A, PTCH1, PTEN*, RAD51C*, RAD51D*, RB1, RECQL, RET, SDHA, SDHAF2, SDHB, SDHC, SDHD, SMAD4, SMARCA4, SMARCB1, SMARCE1, STK11, SUFU, TMEM127, TP53*, TSC1, TSC2, VHL and XRCC2 (sequencing and deletion/duplication); EGFR, EGLN1, HOXB13, KIT, MITF, PDGFRA, POLD1, and POLE (sequencing only); EPCAM and GREM1 (deletion/duplication only). DNA and RNA analyses performed for * genes. The report date is June 11, 2020.     FAMILY HISTORY:  We obtained a detailed, 4-generation family history.  Significant diagnoses are listed below: Family History  Problem Relation Age of Onset  . Hypertension Mother   . Hyperlipidemia Mother   . Pancreatic cancer Father 61       deceased age 54  . Cancer - Other Paternal Grandmother   . Skin cancer Paternal  Grandfather   . Heart disease Paternal Grandfather   . Hyperlipidemia Maternal Aunt   . Breast cancer Maternal Aunt        dx over 35  . Breast cancer Other        dx over 46; MGMs sister  . Pancreatic cancer Other        PGMs 3 brothers  . Colon cancer Neg Hx   . Esophageal cancer Neg Hx     The patient does not have children.  She has two sister and two brothers, none of whom have cancer.  The patient's father is deceased and her mother is living.  The patient's father died of pancreatic cancer at age 24.  He had multiple siblings, none had cancer.  His parents are cancer free.  His mother's three brothers all died of pancreatic cancer in their 75's.  The patient's mother had 11 siblings and one sister had breast cancer.  The maternal grandparents are deceased of old age.  Her grandmother's sister had breast cancer.  Lauren Mcclure is unaware of previous family history of genetic testing for hereditary cancer risks. Patient's maternal ancestors are of [place] descent, and paternal ancestors are of [place] and [place] descent. There is no reported Ashkenazi Jewish ancestry. There is no known consanguinity.  GENETIC TEST RESULTS: Genetic testing reported out on June 11, 2020 through the CancerNext-Expanded+RNAinsight cancer panel found no pathogenic mutations. The CancerNext-Expanded gene panel offered by [facility] and includes sequencing and rearrangement analysis for the following 77 genes: AIP, ALK, APC*, ATM*, AXIN2, BAP1, BARD1, BLM, BMPR1A, BRCA1*, BRCA2*, BRIP1*, CDC73, CDH1*, CDK4, CDKN1B, CDKN2A, CHEK2*, CTNNA1, DICER1, FANCC, FH, FLCN, GALNT12, KIF1B, LZTR1, MAX, MEN1, MET, MLH1*, MSH2*, MSH3, MSH6*, MUTYH*, NBN, NF1*, NF2,  NTHL1, PALB2*, PHOX2B, PMS2*, POT1, PRKAR1A, PTCH1, PTEN*, RAD51C*, RAD51D*, RB1, RECQL, RET, SDHA, SDHAF2, SDHB, SDHC, SDHD, SMAD4, SMARCA4, SMARCB1, SMARCE1, STK11, SUFU, TMEM127, TP53*, TSC1, TSC2, VHL and XRCC2 (sequencing and  deletion/duplication); EGFR, EGLN1, HOXB13, KIT, MITF, PDGFRA, POLD1, and POLE (sequencing only); EPCAM and GREM1 (deletion/duplication only). DNA and RNA analyses performed for * genes. The test report has been scanned into EPIC and is located under the Molecular Pathology section of the Results Review tab.  A portion of the result report is included below for reference.     We discussed with Lauren Mcclure that because current genetic testing is not perfect, it is possible there may be a gene mutation in one of these genes that current testing cannot detect, but that chance is small.  We also discussed, that there could be another gene that has not yet been discovered, or that we have not yet tested, that is responsible for the cancer diagnoses in the family. It is also possible there is a hereditary cause for the cancer in the family that Lauren Mcclure did not inherit and therefore was not identified in her testing.  Therefore, it is important to remain in touch with cancer genetics in the future so that we can continue to offer Lauren Mcclure the most up to date genetic testing.   ADDITIONAL GENETIC TESTING: We discussed with Lauren Mcclure that her genetic testing was fairly extensive.  If there are genes identified to increase cancer risk that can be analyzed in the future, we would be happy to discuss and coordinate this testing at that time.    CANCER SCREENING RECOMMENDATIONS: Lauren Mcclure test result is considered negative (normal).  This means that we have not identified a hereditary cause for her personal and family history of cancer at this time. Most cancers happen by chance and this negative test suggests that her cancer may fall into this category.    While reassuring, this does not definitively rule out a hereditary predisposition to cancer. It is still possible that there could be genetic mutations that are undetectable by current technology. There could be genetic mutations in genes that  have not been tested or identified to increase cancer risk.  Therefore, it is recommended she continue to follow the cancer management and screening guidelines provided by her oncology and primary healthcare provider.   An individual's cancer risk and medical management are not determined by genetic test results alone. Overall cancer risk assessment incorporates additional factors, including personal medical history, family history, and any available genetic information that may result in a personalized plan for cancer prevention and surveillance  RECOMMENDATIONS FOR FAMILY MEMBERS:  Individuals in this family might be at some increased risk of developing cancer, over the general population risk, simply due to the family history of cancer.  We recommended women in this family have a yearly mammogram beginning at age 87, or 31 years younger than the earliest onset of cancer, an annual clinical breast exam, and perform monthly breast self-exams. Women in this family should also have a gynecological exam as recommended by their primary provider. All family members should be referred for colonoscopy starting at age 26.  FOLLOW-UP: Lastly, we discussed with Lauren Mcclure that cancer genetics is a rapidly advancing field and it is possible that new genetic tests will be appropriate for her and/or her family members in the future. We encouraged her to remain in contact with cancer genetics on an annual basis so we can update her personal and  family histories and let her know of advances in cancer genetics that may benefit this family.   Our contact number was provided. Lauren Mcclure questions were answered to her satisfaction, and she knows she is welcome to call us at anytime with additional questions or concerns.   Roma Kayser, [place], [facility] Licensed, Certified Genetic Counselor [email or web address]

## 2020-06-20 ENCOUNTER — Ambulatory Visit
Admission: RE | Admit: 2020-06-20 | Discharge: 2020-06-20 | Disposition: A | Discharge status: Home / Self Care | Payer: BC Managed Care – PPO | Source: Ambulatory Visit | Attending: Family Medicine | Admitting: Family Medicine

## 2020-06-20 ENCOUNTER — Other Ambulatory Visit: Payer: Self-pay

## 2020-06-20 DIAGNOSIS — R928 Other abnormal and inconclusive findings on diagnostic imaging of breast: Secondary | ICD-10-CM

## 2020-06-28 ENCOUNTER — Other Ambulatory Visit: Payer: Self-pay | Admitting: Family Medicine

## 2020-06-28 DIAGNOSIS — I10 Essential (primary) hypertension: Secondary | ICD-10-CM

## 2020-07-07 ENCOUNTER — Ambulatory Visit: Payer: BC Managed Care – PPO | Admitting: Family Medicine

## 2020-07-08 ENCOUNTER — Ambulatory Visit (INDEPENDENT_AMBULATORY_CARE_PROVIDER_SITE_OTHER): Payer: BC Managed Care – PPO | Admitting: Internal Medicine

## 2020-07-08 ENCOUNTER — Encounter: Payer: Self-pay | Admitting: Internal Medicine

## 2020-07-08 ENCOUNTER — Other Ambulatory Visit: Payer: Self-pay

## 2020-07-08 VITALS — BP 126/86 | HR 81 | Ht 66.0 in | Wt 180.0 lb

## 2020-07-08 DIAGNOSIS — E890 Postprocedural hypothyroidism: Secondary | ICD-10-CM

## 2020-07-08 DIAGNOSIS — C73 Malignant neoplasm of thyroid gland: Secondary | ICD-10-CM

## 2020-07-08 LAB — TSH: TSH: 3.76 u[IU]/mL (ref 0.35–4.50)

## 2020-07-08 MED ORDER — LEVOTHYROXINE SODIUM 100 MCG PO TABS: 100.0000 ug | ORAL_TABLET | Freq: Every day | ORAL | 3 refills | Status: DC

## 2020-07-08 NOTE — Patient Instructions (Signed)
-   Continue Levothyroxine 100 mcg, HALF a tablet on Sundays and 1 tablet daily ( Monday- Saturday )week

## 2020-07-08 NOTE — Progress Notes (Signed)
 Name: Lauren Mcclure  MRN/ DOB: [id number], 01/03/1971    Age/ Sex: 50 y.o., female     PCP: Copland, Jessica C, MD   Reason for Endocrinology Evaluation: PTC     Initial Endocrinology Clinic Visit: 01/08/2020    PATIENT IDENTIFIER: Lauren Mcclure is a 50 y.o., female with a past medical history of PTC. She has followed with [facility] since 01/08/2020 for consultative assistance with management of her PTC.   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 The tumor was unifocal , 1.2 cm in diameter on the left. Margins uninvolved . One L.N submitted with no involvement.    Due to hyperthyroid symptoms that was interfering with daily activities, we had reduced her LT-4 replacement dose in 03/2020  No  FH of thyroid disease   SUBJECTIVE:    Today (07/08/2020):  [name] is here for a follow up on PTC.    She has  Been on LT-4 replacement since her surgery.  Denies local neck symptoms  Has been noted with weight gain  Denies diarrhea  Continue with  occasional palpitations    Levothyroxine 100 mcg , half a tablet on Sundays  and 1 tablet the rest of the week   HISTORY:  Past Medical History:  Past Medical History:  Diagnosis Date  . Anxiety   . Arthritis   . Cancer (HCC)    thyroid and skin  . Elevated blood pressure reading   . Family history of breast cancer   . Family history of pancreatic cancer   . Headache   . History of hyperlipidemia   . History of kidney stones   . Hypertension   . Kidney stones   . 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 LITHOTRIPSY    . NASAL SEPTUM SURGERY  2007  . SKIN CANCER EXCISION  04/08/2020   Left lower back   . THYROIDECTOMY N/A 02/05/2020   Procedure: TOTAL THYROIDECTOMY;  Surgeon: Gerkin, Todd, MD;  Location: [facility];  Service: General;  Laterality: N/A;  . TONSILLECTOMY      Social History:  reports that she has never smoked. She has never used smokeless tobacco. She reports current alcohol use. She reports that she does not use drugs. Family History:  Family History  Problem Relation Age of Onset  . Hypertension Mother   . Hyperlipidemia Mother   . Pancreatic cancer Father 49       deceased age 50  . Cancer - Other Paternal Grandmother   . Skin cancer Paternal Grandfather   . Heart disease Paternal Grandfather   . Hyperlipidemia Maternal Aunt   . Breast cancer Maternal Aunt        dx over 50  . Breast cancer Other        dx over 50; MGMs sister  . Pancreatic cancer Other        PGMs 3 brothers  . Colon cancer Neg Hx   . Esophageal cancer Neg Hx        HOME MEDICATIONS: Allergies as of 07/08/2020      Reactions   Rocephin [ceftriaxone Sodium In Dextrose] Rash      Medication List       Accurate as of July 08, 2020 10:06 AM. If you have any questions, ask your nurse or doctor.        amLODipine 10 MG tablet Commonly known as: NORVASC TAKE 1 TABLET(10 MG) BY MOUTH DAILY   CALCIUM 1200 PO Take 1 tablet by mouth as directed. 2 times a week   calcium carbonate 500 MG chewable tablet Commonly known as: Tums Chew 2 tablets (400 mg of elemental calcium total) by mouth 2 (two) times daily. What changed:   when to take this  reasons to take this   celecoxib 200 MG capsule Commonly known as: CELEBREX Take 1 capsule (200 mg total) by mouth daily as needed.   CRANBERRY PO Take 1 tablet by mouth daily.   levothyroxine 100 MCG tablet Commonly known as: SYNTHROID Take 1 tablet (100 mcg total) by  mouth daily before breakfast.   losartan 25 MG tablet Commonly known as: COZAAR Take 1 tablet (25 mg total) by mouth daily.   multivitamin tablet Take 1 tablet by mouth daily as needed.   omeprazole 20 MG capsule Commonly known as: PRILOSEC Take 1 capsule (20 mg total) by mouth 2 (two) times daily before a meal. Use Prilosec 20 mg twice daily for 6 weeks.  If symptoms controlled can decrease to 20 mg daily.   rosuvastatin 20 MG tablet Commonly known as: Crestor Take 1 tablet (20 mg total) by mouth daily. What changed:   when to take this  reasons to take this   TURMERIC PO Take 1 tablet by mouth daily as needed (inflammation).   VITAMIN C PO Take 1 tablet by mouth daily as needed.   zolpidem 5 MG tablet Commonly known as: AMBIEN TAKE 1/2 TO 1 TABLET(2.5 TO 5 MG) BY MOUTH AT BEDTIME AS NEEDED FOR SLEEP         OBJECTIVE:   PHYSICAL EXAM: VS: BP 126/86   Pulse 81   Ht 5' 6" (1.676 m)   Wt 180 lb (81.6 kg)   LMP 09/04/2018 Comment: possible perimenopausal  SpO2 98%   BMI 29.05 kg/m    EXAM: General: Pt appears well and is in NAD  Neck: General: Supple without adenopathy. Thyroid:  No goiter or nodules appreciated.   Lungs: Clear with good BS bilat with no rales, rhonchi, or wheezes  Heart: Auscultation: RRR.  Abdomen: Normoactive bowel sounds, soft, nontender, without masses or organomegaly palpable  Extremities:  BL LE: No pretibial edema normal ROM and strength.  Mental Status: Judgment, insight: Intact Memory: Intact for recent and remote events Mood and affect: No depression, anxiety, or agitation     DATA REVIEWED:  Results for ADEN, YOUNGMAN (MRN [id number]) as of 07/08/2020 16:21  Ref. Range 05/29/2020 14:48 07/08/2020 10:24  TSH Latest Ref Range: 0.35 - 4.50 uIU/mL 0.70 3.76   Thyroid Pathology 02/05/2020  FINAL MICROSCOPIC DIAGNOSIS:   A. 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)  - Benign cyst (2.4 cm)  - See oncology table and comment below   ONCOLOGY TABLE:   THYROID GLAND:   Procedure: Thyroidectomy  Tumor Focality: Unifocal  Tumor Site: Left lobe  Tumor Size: 1.2 cm  Histologic Type: Papillary carcinoma, classic  Margins:  Uninvolved by tumor  Angioinvasion: Not identified  Lymphatic Invasion: Not identified  Extrathyroidal extension: Not identified  Regional Lymph Nodes:    Number of Lymph Nodes Involved: 0    Nodal Levels Involved: 0    Size of Largest Metastatic Deposit: N/A    Extranodal Extension (ENE): N/A    Number of Lymph Nodes Examined: 1  Pathologic Stage Classification (pTNM, AJCC8th Edition): pT1b, pN0   ASSESSMENT / PLAN / RECOMMENDATIONS:   1. Papillary Thyroid Carcinoma, Stage I :   - S/P total thyroidectomy on 02/05/2020 - She is at low risk for recurrence - TSH goal 0.5-2.0 uIU/mL if nonstimultated Tg is undetectable.  - TSH elevated today, despite being on the same dose for months, she assures me compliance, will increase the dose as below  - Tg, Tg Ab pending  - Will proceed with thyroid bed ultrasound      2. Postoperative Hypothyroidism :  - She is clinically euthyroid  - TSh above goal , will increase levothyroxine  - Pt educated extensively on the correct way to take levothyroxine (first thing in the morning with water, 30 minutes before eating or taking other medications). - Pt encouraged to double dose the following day if she were to miss a dose given long half-life of levothyroxine.   Medications  Increase  Levothyroxine 100 mcg,  1 tablet daily including Sundays     F/U in 6 months   Addendum: Discussed TSH with the pt on 07/08/2020 at 1615   Signed electronically by: Abby Jaralla Shamleffer, MD  [facility]  [facility] [address] [place], [place] [address] Phone: [phone] FAX:  [phone]      CC: Copland, Jessica C, MD [address] [place] [address] Phone: [phone]  Fax: [phone]   Return to Endocrinology clinic as below: Future Appointments  Date Time Provider Department Center  07/08/2020 10:10 AM Shamleffer, Ibtehal Jaralla, MD [facility]  07/09/2020  9:40 AM Cirigliano, Vito V, DO [facility]     

## 2020-07-09 ENCOUNTER — Encounter: Payer: Self-pay | Admitting: Gastroenterology

## 2020-07-09 ENCOUNTER — Ambulatory Visit (INDEPENDENT_AMBULATORY_CARE_PROVIDER_SITE_OTHER): Payer: BC Managed Care – PPO | Admitting: Gastroenterology

## 2020-07-09 ENCOUNTER — Encounter: Payer: Self-pay | Admitting: Internal Medicine

## 2020-07-09 VITALS — BP 132/94 | HR 93 | Ht 66.0 in | Wt 181.2 lb

## 2020-07-09 DIAGNOSIS — K760 Fatty (change of) liver, not elsewhere classified: Secondary | ICD-10-CM | POA: Diagnosis not present

## 2020-07-09 DIAGNOSIS — Z86010 Personal history of colonic polyps: Secondary | ICD-10-CM | POA: Diagnosis not present

## 2020-07-09 DIAGNOSIS — K2100 Gastro-esophageal reflux disease with esophagitis, without bleeding: Secondary | ICD-10-CM | POA: Diagnosis not present

## 2020-07-09 DIAGNOSIS — Z800 Family history of malignant neoplasm of digestive organs: Secondary | ICD-10-CM

## 2020-07-09 DIAGNOSIS — R7401 Elevation of levels of liver transaminase levels: Secondary | ICD-10-CM

## 2020-07-09 LAB — THYROGLOBULIN LEVEL: Thyroglobulin: 0.4 ng/mL — ABNORMAL LOW

## 2020-07-09 LAB — THYROGLOBULIN ANTIBODY: Thyroglobulin Ab: <1 IU/mL (ref <=1)

## 2020-07-09 NOTE — Patient Instructions (Signed)
If you are age 51 or older, your body mass index should be between 23-30. Your Body mass index is 29.25 kg/m. If this is out of the aforementioned range listed, please consider follow up with your Primary Care Provider.  If you are age 87 or younger, your body mass index should be between 19-25. Your Body mass index is 29.25 kg/m. If this is out of the aformentioned range listed, please consider follow up with your Primary Care Provider.   Please go to the lab on the 2nd floor [address] in October 2022 for labwork.  Contact he office in September to schedule a 6 month appointment.  Start a diet and exercise plan to achieve a 10% weight loss.  Due to recent changes in healthcare laws, you may see the results of your imaging and laboratory studies on MyChart before your provider has had a chance to review them.  We understand that in some cases there may be results that are confusing or concerning to you. Not all laboratory results come back in the same time frame and the provider may be waiting for multiple results in order to interpret others.  Please give [place] 48 hours in order for your provider to thoroughly review all the results before contacting the office for clarification of your results.   Thank you for choosing me and [facility].  Vito Cirigliano, D.O.

## 2020-07-09 NOTE — Progress Notes (Signed)
P  Chief Complaint:    Procedure follow-up, fatty liver  GI History: 50 y.o. 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, postsurgical hypothyroidism, Squamous Cell Carcinoma on back s/p excision 03/2020, HTN, prediabetes, HLD, nephrolithiasis, initially seen in the GI clinic on 04/24/2020 for evaluation of following:  1) Positive Cologuard test in 06/2019.  Otherwise no GI symptoms. - Colonoscopy completed 05/2020 as below. Rpeeat in 5 years.   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  Previous peak AST/ALT 96/133 in 11/2019, then down trended for a few months, and back up again in 03/2020 at 80/142.  Otherwise been normal ALP and albumin.  Normal renal function -RUQ [place] (9/21): Hepatic steatosis, 8 mm x 7 mm x 7 mm indeterminate right hepatic lobe lesion.  1 cm right hepatic dome cyst.  No gallstones, normal CBD -MRI liver (10/21): Right hepatic lobe cyst, marked steatosis, small hemangioma vs benign vascular shunt in right hepatic lobe, GB sludge. -Vitamin D 23 -TSH 0.14 -Negative hepatitis C (11/2019) -04/2020: Negative/normal TTG, AMA, ANA, ASMA, IgM, IgG, IgA, A1 AT, viral hepatitis panel, iron panel  3) RUQ discomfort.  Not described as pain.  Started May 2021 without clear alleviating/exacerbating factors.  Some associated nausea, but no emesis. -HIDA (05/2020): Normal  4) Family history: Father died of Pancreatic CA at age 27. Brother with some liver issues, but she isnt sure the details. Neither of them drank at all. No other known FHx of pancreaticobiliary disease. No known family history of CRC, GI malignancy, liver disease, or IBD.  -Refer to genetics clinic.  Negative gene testing on 06/11/2020.  No recommendation for increased screening measures  5) GERD with reflux esophagitis.  Index sxs  of HB and MEG discomfort. No regurgitation or dysphagia. EE diagnosed at time of EGD 05/2020.  Started on Prilosec 20 mg bid x6 weeks, then reduce to 20 mg/day.  Endoscopic History: -EGD (05/2020, Dr. Bryan Lemma): LA Grade A esophagitis, Hill grade 2, 3 mm gastric hyperplastic polyp, Otherwise normal stomach and duodenum (path benign) -Colonoscopy (05/2020, Dr. Bryan Lemma): 4 polyps (SSP x1, HP x3), 5 rectal hyperplastic polyps, grade 1 internal hemorrhoids.  Normal TI.  Repeat in 5 years   HPI:     Patient is a 50 y.o. female presenting to the Gastroenterology Clinic for follow-up.   Has noticed some mild discomfort in MEG/subxiphoid, which has improved since starting the omeprazole BID. Will have breakthrough if she only takes Prilosec once/day.   She has stopped alcohol and coffee 2/2 to GERD, but has gained weight, mostly due to decreased activity.    No longer having RUQ pain/discomfort.    Review of systems:     No chest pain, no SOB, no fevers, no urinary sx   Past Medical History:  Diagnosis Date  . Anxiety   . Arthritis   . Cancer (HCC)    thyroid and skin  . Elevated blood pressure reading   . Family history of breast cancer   . Family history of pancreatic cancer   . Headache   . History of hyperlipidemia   . History of kidney stones   . Hypertension   . Kidney stones   . 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 amLODipine (NORVASC) 10 MG tablet TAKE 1 TABLET(10 MG) BY MOUTH DAILY 90 tablet 3  . Ascorbic Acid (VITAMIN C PO) Take 1 tablet by mouth daily as needed.    . calcium carbonate (TUMS) 500 MG chewable tablet Chew 2 tablets (400 mg of elemental calcium total) by mouth 2 (two) times daily. (Patient taking differently:  Chew 2 tablets by mouth as needed.) 90 tablet 1  . Calcium Carbonate-Vit D-Min (CALCIUM 1200 PO) Take 1 tablet by mouth as directed. 2 times a week    . celecoxib (CELEBREX) 200 MG capsule Take 1 capsule (200 mg total) by mouth daily as needed. 30 capsule 1  . CRANBERRY PO Take 1 tablet by mouth daily.    Marland Kitchen levothyroxine (SYNTHROID) 100 MCG tablet Take 1 tablet (100 mcg total) by mouth daily before breakfast. 90 tablet 3  . losartan (COZAAR) 25 MG tablet Take 1 tablet (25 mg total) by mouth daily. 90 tablet 1  . Multiple Vitamin (MULTIVITAMIN) tablet Take 1 tablet by mouth daily as needed.    Marland Kitchen omeprazole (PRILOSEC) 20 MG capsule Take 1 capsule (20 mg total) by mouth 2 (two) times daily before a meal. Use Prilosec 20 mg twice daily for 6 weeks.  If symptoms controlled can decrease to 20 mg daily. 90 capsule 3  . rosuvastatin (CRESTOR) 20 MG tablet Take 1 tablet (20 mg total) by mouth daily. (Patient taking differently: Take 20 mg by mouth as needed.) 90 tablet 3  . TURMERIC PO Take 1 tablet by mouth daily as needed (inflammation).    Marland Kitchen zolpidem (AMBIEN) 5 MG tablet TAKE 1/2 TO 1 TABLET(2.5 TO 5 MG) BY MOUTH AT BEDTIME AS NEEDED FOR SLEEP 15 tablet 2   No current facility-administered medications for this visit.    Physical Exam:     Ht 5\' 6"  (1.676 m)   Wt 181 lb 4 oz (82.2 kg)   LMP 09/04/2018 Comment: possible perimenopausal  BMI 29.25 kg/m   GENERAL:  Pleasant female in NAD PSYCH: : Cooperative, normal affect NEURO: Alert and oriented x 3, no focal neurologic deficits   IMPRESSION and PLAN:    1) Fatty liver disease: -Again discussed diagnosis and neurology today -Recommended diet/exercise for goal 10% body weight loss done slowly over weeks to months -Extended serologic work-up otherwise unrevealing for concomitant liver disease -Repeat liver enzymes in 6 months.  If still elevated, plan for additional work-up to include Fib-4 testing with decision regarding ultrasound  elastography based on score -Recently updated guidelines from the AGA recommends patients with steatosis on imaging and/or elevated aminotransferases undergo noninvasive testing for fibrosis.  Patients with Fib-4 score <1.3 or likely low risk for rapid progression of fibrosis can repeat noninvasive testing every 2-3 years - Recommended low-fat/cholesterol/carbohydrate diet, restrict calories  2) GERD with erosive esophagitis -Reflux symptoms well controlled with Prilosec 20 mg bid.  Tends to have breakthrough when she only takes once/day. -Continue twice daily dosing for now -Continue antireflux lifestyle/dietary modifications -Has limiting some exacerbating factors, to include EtOH and caffeine  3) Family history of pancreatic cancer -Underwent genetic testing 06/2020, unrevealing for any genetic mutations -Does not fit CAPS consortium guidelines for increased risk screening protocol  4) History of colon polyps -Repeat colonoscopy 2027 for ongoing polyp surveillance  RTC in 6 months or sooner as needed  I spent 30 minutes of time, including  in depth chart review, independent review of results as outlined above, communicating results with the patient directly, face-to-face time with the patient, coordinating care, and ordering studies and medications as appropriate, and documentation.       Lavena Bullion ,DO, FACG 07/09/2020, 9:47 AM

## 2020-07-11 ENCOUNTER — Encounter: Payer: Self-pay | Admitting: Internal Medicine

## 2020-07-11 ENCOUNTER — Ambulatory Visit (HOSPITAL_BASED_OUTPATIENT_CLINIC_OR_DEPARTMENT_OTHER)
Admission: RE | Admit: 2020-07-11 | Discharge: 2020-07-11 | Disposition: A | Discharge status: Home / Self Care | Payer: BC Managed Care – PPO | Source: Ambulatory Visit | Attending: Internal Medicine | Admitting: Internal Medicine

## 2020-07-11 ENCOUNTER — Other Ambulatory Visit: Payer: Self-pay

## 2020-07-11 DIAGNOSIS — C73 Malignant neoplasm of thyroid gland: Secondary | ICD-10-CM | POA: Diagnosis present

## 2020-07-25 ENCOUNTER — Telehealth: Payer: Self-pay | Admitting: Internal Medicine

## 2020-07-25 ENCOUNTER — Other Ambulatory Visit: Payer: Self-pay | Admitting: Internal Medicine

## 2020-07-25 DIAGNOSIS — C73 Malignant neoplasm of thyroid gland: Secondary | ICD-10-CM

## 2020-07-25 IMAGING — MG DIGITAL SCREENING BILAT W/ TOMO W/ CAD
6 of 10 series · 6 of 30 positions shown · non-contrast
Comparison: Previous exam(s).

CLINICAL DATA: Screening.

EXAM:
DIGITAL SCREENING BILATERAL MAMMOGRAM WITH TOMO AND CAD

[R CC synth-2D]
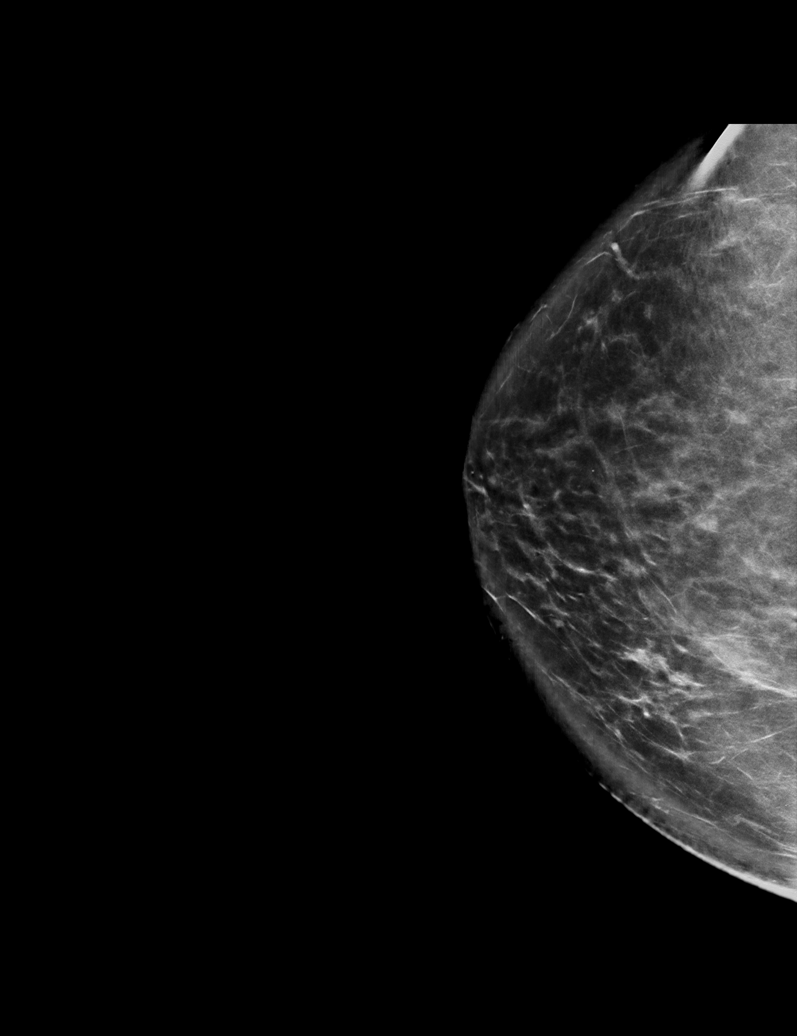

[L CC synth-2D]
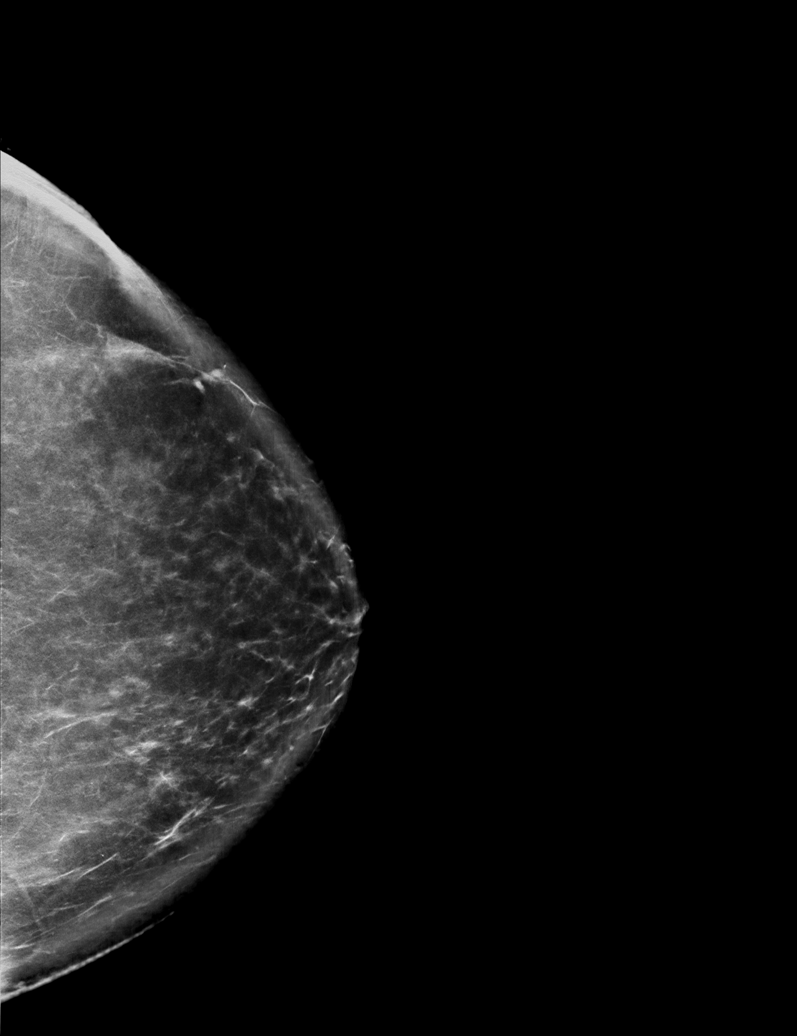

[L MLO synth-2D]
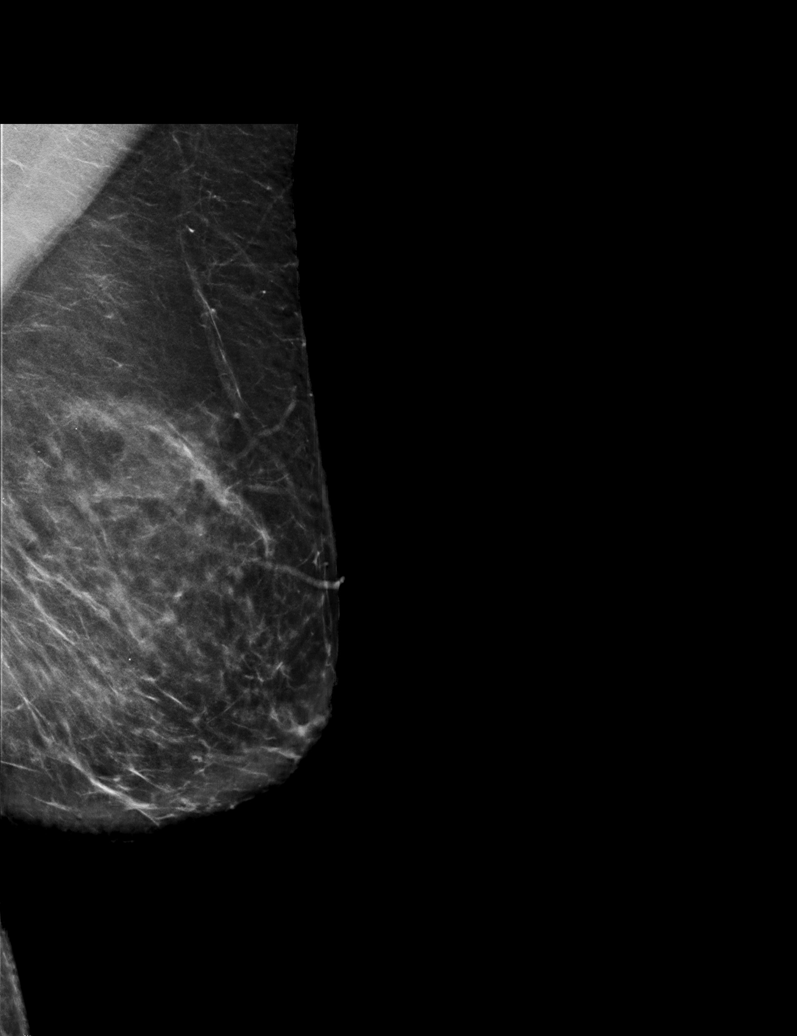

[R MLO synth-2D]
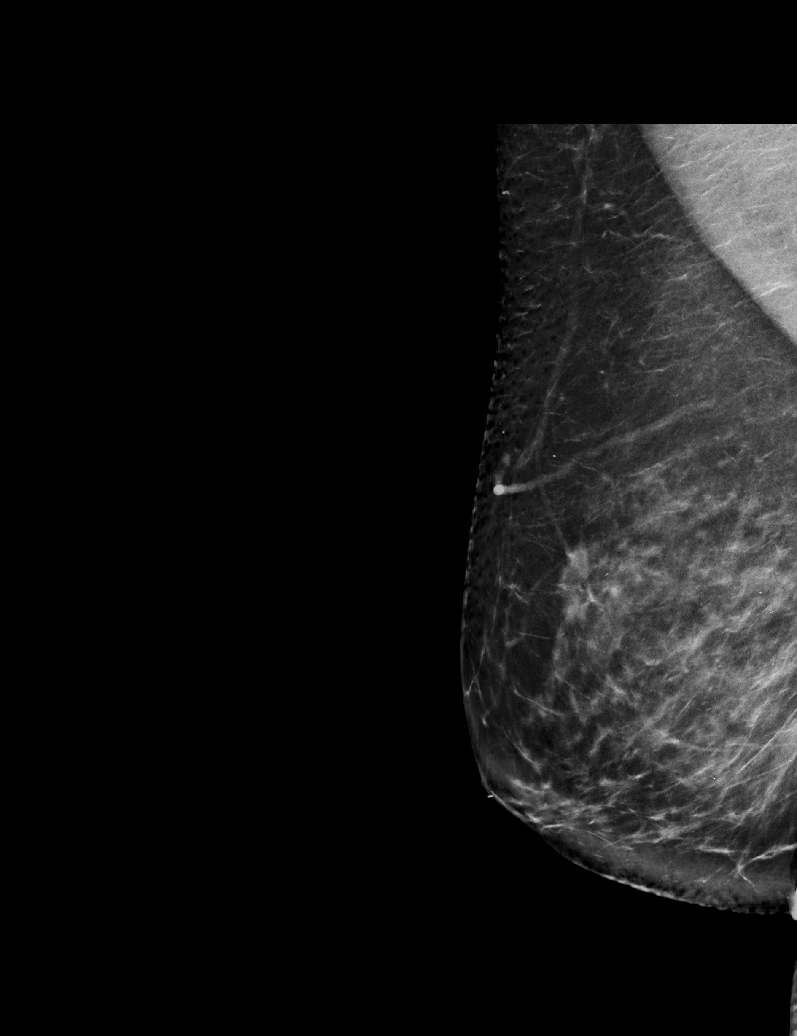

[R XCCL synth-2D]
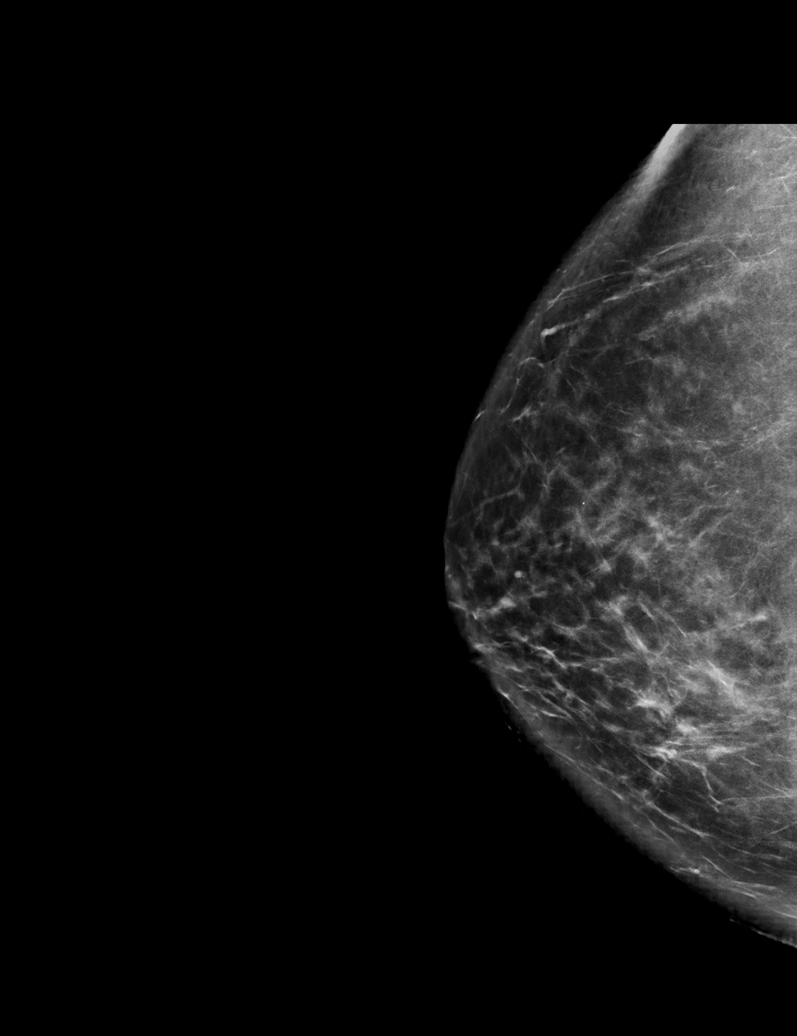

[R CC tomo · tomo slice 46/91.0]
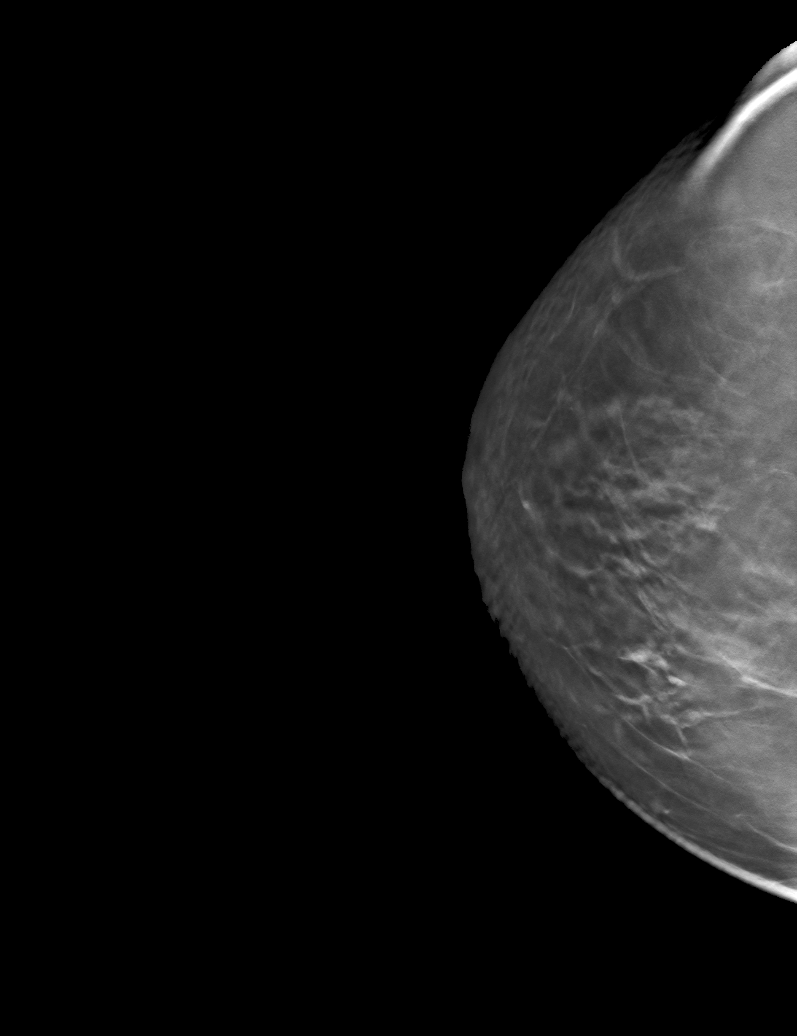

[6 of 30 positions shown; findings below may reference images not displayed]

ACR Breast Density Category b: There are scattered areas of
fibroglandular density.
FINDINGS: There are no findings suspicious for malignancy. Images were
processed with CAD.
IMPRESSION: No mammographic evidence of malignancy. A result letter of this
screening mammogram will be mailed directly to the patient.

RECOMMENDATION:
Screening mammogram in one year. (Code:[id number])

BI-RADS CATEGORY  1: Negative.

## 2020-07-25 NOTE — Telephone Encounter (Signed)
Please let the spouse /pt know that she can have her thyroid checked ( she usually sees me in [place] ) so she can pick where she wants her labs done. But let them know that her  thyroid was not that off a couple of weeks ago and I doubt that this is related to that and she will need to contact PCP for swelling of the legs.

## 2020-07-25 NOTE — Telephone Encounter (Signed)
Patient spouse [name] called to advise that patient is travelling [place] and has been having trouble contacting us so requested that he call and relay message to Dr Kelton Pillar for her.    Patient is not feeling well, has swelling bilaterally in feet and legs.  Feels like thyroid my be the cause.  Patients # [phone] (if we are not able to connect - call [name] (Spouse) at [phone]

## 2020-07-25 NOTE — Telephone Encounter (Signed)
Spoken to patient and notified Dr Shamleffer's comments. Verbalized understanding.   

## 2020-07-25 NOTE — Telephone Encounter (Signed)
Please advise 

## 2020-07-28 ENCOUNTER — Telehealth: Payer: Self-pay | Admitting: Family Medicine

## 2020-07-28 NOTE — Telephone Encounter (Signed)
Patient would like to let you know she has swelling on both of her legs. Patient states is probably due to new medication. Patient decline appt

## 2020-07-29 MED ORDER — LOSARTAN POTASSIUM 50 MG PO TABS: 50.0000 mg | ORAL_TABLET | Freq: Every day | ORAL | 3 refills | Status: DC

## 2020-07-29 NOTE — Telephone Encounter (Signed)
Patient seen on 06/30/2020 and was prescribed amlodipine 10 mg. She is now experiencing bilateral feet swelling.

## 2020-07-29 NOTE — Telephone Encounter (Signed)
Called and left detailed message Increase losartan to 50 mg (I sent in rx) and drop amlodipine to 5mg   Please let me know how this works for her, I am glad to see her in a couple of weeks and check her BP if she likes

## 2020-11-24 ENCOUNTER — Other Ambulatory Visit: Payer: Self-pay | Admitting: Family Medicine

## 2020-11-24 DIAGNOSIS — I10 Essential (primary) hypertension: Secondary | ICD-10-CM

## 2021-01-01 ENCOUNTER — Ambulatory Visit (INDEPENDENT_AMBULATORY_CARE_PROVIDER_SITE_OTHER): Payer: Managed Care, Other (non HMO) | Admitting: Family Medicine

## 2021-01-01 ENCOUNTER — Other Ambulatory Visit: Payer: Self-pay

## 2021-01-01 ENCOUNTER — Encounter: Payer: Self-pay | Admitting: Family Medicine

## 2021-01-01 ENCOUNTER — Ambulatory Visit (HOSPITAL_BASED_OUTPATIENT_CLINIC_OR_DEPARTMENT_OTHER)
Admission: RE | Admit: 2021-01-01 | Discharge: 2021-01-01 | Disposition: A | Discharge status: Home / Self Care | Payer: Managed Care, Other (non HMO) | Source: Ambulatory Visit | Attending: Family Medicine | Admitting: Family Medicine

## 2021-01-01 VITALS — BP 125/85 | HR 80 | Temp 98.4°F | Resp 12 | Ht 66.0 in | Wt 178.4 lb

## 2021-01-01 DIAGNOSIS — S161XXA Strain of muscle, fascia and tendon at neck level, initial encounter: Secondary | ICD-10-CM

## 2021-01-01 DIAGNOSIS — M25562 Pain in left knee: Secondary | ICD-10-CM | POA: Diagnosis not present

## 2021-01-01 DIAGNOSIS — M25561 Pain in right knee: Secondary | ICD-10-CM | POA: Diagnosis not present

## 2021-01-01 DIAGNOSIS — G8929 Other chronic pain: Secondary | ICD-10-CM | POA: Diagnosis not present

## 2021-01-01 MED ORDER — TIZANIDINE HCL 2 MG PO CAPS: 2.0000 mg | ORAL_CAPSULE | Freq: Three times a day (TID) | ORAL | 1 refills | Status: AC | PRN

## 2021-01-01 MED ORDER — CELECOXIB 200 MG PO CAPS: 200.0000 mg | ORAL_CAPSULE | Freq: Two times a day (BID) | ORAL | 1 refills | Status: DC | PRN

## 2021-01-01 NOTE — Patient Instructions (Signed)
Cervical Sprain A cervical sprain is a stretch or tear in one or more of the ligaments in the neck. Ligaments are the tissues that connect bones. Cervical sprains can range from mild to severe. Severe cervical sprains can cause the spinal bones (vertebrae) in the neck to be unstable. This can result in spinal cord damage and in serious nervous system problems. The time that it takes for a cervical sprain to heal depends on the cause and extent of the injury. Most cervical sprains heal in 4-6 weeks. What are the causes? Cervical sprains may be caused by trauma, such as an injury from a motor vehicle accident, a fall, or a sudden forward and backward whipping movement of the head and neck (whiplash injury). Mild cervical sprains may be caused by wear and tear over time. What increases the risk? The following factors may make you more likely to develop this condition: Participating in activities that have a high risk of trauma to the neck. These include contact sports, auto racing, gymnastics, and diving. Taking risks when driving or riding in a motor vehicle. Osteoarthritis of the spine. Poor strength and flexibility of the neck. A previous neck injury. Poor posture. Spending long periods in certain positions that put stress on the neck, such as sitting at a computer for a long time. What are the signs or symptoms? Symptoms of this condition include: Pain, soreness, stiffness, tenderness, swelling, or a burning sensation in the front, back, or sides of the neck, shoulders, or upper back. Sudden tightening of neck muscles (spasms). Limited ability to move the neck. Headache. Dizziness. Nausea or vomiting. Weakness, numbness, or tingling in a hand or an arm. Symptoms may develop right away after injury, or they may develop over a few days. In some cases, symptoms may go away with treatment and return (recur) over time. How is this diagnosed? This condition may be diagnosed based on: Your  medical history. Your symptoms. Any recent injuries or known neck problems that you have, such as arthritis in the neck. A physical exam. Imaging tests, such as X-rays, MRI, and CT scan. How is this treated? This condition is treated by resting and icing the injured area and doing physical therapy exercises. Heat therapy may be used 2-3 days after the injury occurred if there is no swelling. Depending on the severity of your condition, treatment may also include: Keeping your neck in place (immobilized) for periods of time. This may be done using: A cervical collar. This supports your chin and the back of your head. A cervical traction device. This is a sling that holds up your head. The device removes weight and pressure from your neck, and it may help to relieve pain. Medicines that help to relieve pain and inflammation. Medicines that help to relax your muscles (muscle relaxants). Surgery. This is rare. Follow these instructions at home: Medicines  Take over-the-counter and prescription medicines only as told by your health care provider. Ask your health care provider if the medicine prescribed to you: Requires you to avoid driving or using heavy machinery. Can cause constipation. You may need to take these actions to prevent or treat constipation: Drink enough fluid to keep your urine pale yellow. Take over-the-counter or prescription medicines. Eat foods that are high in fiber, such as beans, whole grains, and fresh fruits and vegetables. Limit foods that are high in fat and processed sugars, such as fried or sweet foods. If you have a cervical collar: Wear the collar as told by your   health care provider. Do not remove it unless told. Ask before making any adjustments to your collar. If you have long hair, keep it outside of the collar. Ask your health care provider if you may remove the collar for cleaning and bathing. If so: Follow instructions about how to remove it  safely. Clean it by hand with mild soap and water and air-dry it completely. If your collar has removable pads, remove them every 1-2 days and wash them by hand with soap and water. Let them air-dry completely before putting them back in the collar. Tell your health care provider if your skin under the collar has irritation or sores. Managing pain, stiffness, and swelling   If directed, use a cervical traction device as told. If directed, put ice on the affected area. To do this: Put ice in a plastic bag. Place a towel between your skin and the bag. Leave the ice on for 20 minutes, 2-3 times a day. If directed, apply heat to the affected area before you do your physical therapy or as often as told by your health care provider. Use the heat source that your health care provider recommends, such as a moist heat pack or a heating pad. Place a towel between your skin and the heat source. Leave the heat on for 20-30 minutes. Remove the heat if your skin turns bright red. This is especially important if you are unable to feel pain, heat, or cold. You may have a greater risk of getting burned. Activity Do not drive while wearing a cervical collar. If you do not have a cervical collar, ask if it is safe to drive while your neck heals. Do not lift anything that is heavier than 10 lb (4.5 kg), or the limit that you are told, until your health care provider says that it is safe. Rest as told by your health care provider. If physical therapy was prescribed, do exercises as told by your health care provider or physical therapist. Return to your normal activities as told by your health care provider. Avoid positions and activities that make your symptoms worse. Ask your health care provider what activities are safe for you. General instructions Do not use any products that contain nicotine or tobacco, such as cigarettes, e-cigarettes, and chewing tobacco. These can delay healing. If you need help quitting,  ask your health care provider. Keep all follow-up visits as told by your health care provider or physical therapist. This is important. How is this prevented? To prevent a cervical sprain from happening again: Use and maintain good posture. Make any needed adjustments to your workstation to help you do this. Exercise regularly as told by your health care provider or physical therapist. Avoid risky activities that may cause a cervical sprain. Contact a health care provider if you have: Symptoms that get worse or do not get better after 2 weeks of treatment. Pain that gets worse or does not get better with medicine. New, unexplained symptoms. Sores or irritated skin on your neck from wearing your cervical collar. Get help right away if: You have severe pain. You develop numbness, tingling, or weakness in any part of your body. You cannot move a part of your body (you have paralysis). You have neck pain along with severe dizziness or headache. Summary A cervical sprain is a stretch or tear in one or more of the ligaments in the neck. Cervical sprains may be caused by trauma, such as an injury from a motor vehicle accident, a   fall, or a sudden forward and backward whipping movement of the head and neck (whiplash injury). Symptoms may develop right away after injury, or they may develop over a few days. This condition may be treated with rest, ice, heat, medicines, physical therapy, and surgery. This information is not intended to replace advice given to you by your health care provider. Make sure you discuss any questions you have with your health care provider. Document Revised: 11/29/2018 Document Reviewed: 11/29/2018 Elsevier Patient Education  2022 [employer]  

## 2021-01-01 NOTE — Progress Notes (Signed)
Established Patient Office Visit  Subjective:  Patient ID: Lauren Mcclure, female    DOB: 06-03-1970  Age: 50 y.o. MRN: [id number]  CC:  Chief Complaint  Patient presents with   Shoulder Pain    LEFT   Neck Pain    LEFT SIDE    HPI Lauren Mcclure presents for L shoulder and neck pain x 8 months.  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 03/24/2016   Chest X-Ray done 03/24/16 and was negative    Past Surgical History:  Procedure Laterality Date   LITHOTRIPSY     NASAL SEPTUM SURGERY  2007   SKIN CANCER EXCISION  04/08/2020   Left lower back    THYROIDECTOMY N/A 02/05/2020   Procedure: TOTAL THYROIDECTOMY;  Surgeon: Armandina Gemma, MD;  Location: [facility];  Service: General;  Laterality: N/A;   TONSILLECTOMY      Family History  Problem Relation Age of Onset   Hypertension Mother    Hyperlipidemia Mother    Pancreatic cancer Father 27       deceased age 53   Cancer - Other Paternal Grandmother    Skin cancer Paternal Grandfather    Heart disease Paternal Grandfather    Hyperlipidemia Maternal Aunt    Breast cancer Maternal Aunt        dx over 84   Breast cancer Other        dx over 18; MGMs sister   Pancreatic cancer Other        PGMs 3 brothers   Colon cancer Neg Hx    Esophageal cancer Neg Hx     Social History   Socioeconomic History   Marital status: Married    Spouse name: Not on file   Number of children: Not on file   Years of education: Not on file   Highest education level: Not on file  Occupational History    Not on file  Tobacco Use   Smoking status: Never   Smokeless tobacco: Never  Vaping Use   Vaping Use: Never used  Substance and Sexual Activity   Alcohol use: Not Currently    Comment: Occ wine or beer   Drug use: No   Sexual activity: Not on file  Other Topics Concern   Not on file  Social History Narrative   Not on file   Social Determinants of Health   Financial Resource Strain: Not on file  Food Insecurity: Not on file  Transportation Needs: Not on file  Physical Activity: Not on file  Stress: Not on file  Social Connections: Not on file  Intimate Partner Violence: Not on file    Outpatient Medications Prior to Visit  Medication Sig Dispense Refill   amLODipine (NORVASC) 10 MG tablet TAKE 1 TABLET DAILY 90 tablet 3   Ascorbic Acid (VITAMIN C PO) Take 1 tablet by mouth daily as needed.     calcium carbonate (TUMS) 500  MG chewable tablet Chew 2 tablets (400 mg of elemental calcium total) by mouth 2 (two) times daily. (Patient taking differently: Chew 2 tablets by mouth as needed.) 90 tablet 1   Calcium Carbonate-Vit D-Min (CALCIUM 1200 PO) Take 1 tablet by mouth as directed. 2 times a week     CRANBERRY PO Take 1 tablet by mouth daily.     levothyroxine (SYNTHROID) 100 MCG tablet Take 1 tablet (100 mcg total) by mouth daily before breakfast. 90 tablet 3   losartan (COZAAR) 50 MG tablet Take 1 tablet (50 mg total) by mouth daily. 90 tablet 3   Multiple Vitamin (MULTIVITAMIN) tablet Take 1 tablet by mouth daily as needed.     omeprazole (PRILOSEC) 20 MG capsule Take 1 capsule (20 mg total) by mouth 2 (two) times daily before a meal. Use Prilosec 20 mg twice daily for 6 weeks.  If symptoms controlled can decrease to 20 mg daily. 90 capsule 3   rosuvastatin (CRESTOR) 20 MG tablet Take 1 tablet (20 mg total) by mouth daily. (Patient taking differently: Take 20 mg by mouth as needed.) 90 tablet 3   TURMERIC PO Take 1 tablet by mouth daily as needed (inflammation).     zolpidem  (AMBIEN) 5 MG tablet TAKE 1/2 TO 1 TABLET(2.5 TO 5 MG) BY MOUTH AT BEDTIME AS NEEDED FOR SLEEP 15 tablet 2   celecoxib (CELEBREX) 200 MG capsule Take 1 capsule (200 mg total) by mouth daily as needed. 30 capsule 1   No facility-administered medications prior to visit.    Allergies  Allergen Reactions   Rocephin [Ceftriaxone Sodium In Dextrose] Rash    ROS Review of Systems  Constitutional:  Negative for appetite change, diaphoresis, fatigue and unexpected weight change.  Eyes:  Negative for pain, redness and visual disturbance.  Respiratory:  Negative for cough, chest tightness, shortness of breath and wheezing.   Cardiovascular:  Negative for chest pain, palpitations and leg swelling.  Endocrine: Negative for cold intolerance, heat intolerance, polydipsia, polyphagia and polyuria.  Genitourinary:  Negative for difficulty urinating, dysuria and frequency.  Musculoskeletal:  Positive for neck pain and neck stiffness.  Neurological:  Negative for dizziness, light-headedness, numbness and headaches.     Objective:    Physical Exam Vitals and nursing note reviewed.  Constitutional:      Appearance: She is well-developed.  HENT:     Head: Normocephalic and atraumatic.  Eyes:     Conjunctiva/sclera: Conjunctivae normal.  Neck:     Thyroid: No thyromegaly.     Vascular: No carotid bruit or JVD.  Cardiovascular:     Rate and Rhythm: Normal rate and regular rhythm.     Heart sounds: Normal heart sounds. No murmur heard. Pulmonary:     Effort: Pulmonary effort is normal. No respiratory distress.     Breath sounds: Normal breath sounds. No wheezing or rales.  Chest:     Chest wall: No tenderness.  Musculoskeletal:        General: Tenderness present.     Left shoulder: Tenderness present.     Cervical back: Normal range of motion and neck supple. Muscular tenderness present.     Comments: Muscle spasm L side neck and shoulder -- trap  Pt has full rom but with pain  Neurological:      Mental Status: She is alert and oriented to person, place, and time.    BP 125/85 (BP Location: Right Arm, Cuff Size: Large)   Pulse 80   Temp 98.4 F (  36.9 C) (Oral)   Resp 12   Ht 5\' 6"  (1.676 m)   Wt 178 lb 6.4 oz (80.9 kg)   LMP 09/04/2018 Comment: possible perimenopausal  SpO2 97%   BMI 28.79 kg/m  Wt Readings from Last 3 Encounters:  01/01/21 178 lb 6.4 oz (80.9 kg)  07/09/20 181 lb 4 oz (82.2 kg)  07/08/20 180 lb (81.6 kg)     Health Maintenance Due  Topic Date Due   Zoster Vaccines- Shingrix (1 of 2) Never done   PAP SMEAR-Modifier  04/05/2017   TETANUS/TDAP  04/05/2018   COVID-19 Vaccine (3 - Pfizer risk series) 10/29/2019   INFLUENZA VACCINE  11/03/2020    There are no preventive care reminders to display for this patient.  Lab Results  Component Value Date   TSH 3.76 07/08/2020   Lab Results  Component Value Date   WBC 8.8 01/29/2020   HGB 16.6 (H) 01/29/2020   HCT 48.2 (H) 01/29/2020   MCV 83.0 01/29/2020   PLT 268 01/29/2020   Lab Results  Component Value Date   NA 139 03/26/2020   K 4.0 03/26/2020   CO2 25 03/26/2020   GLUCOSE 128 (H) 03/26/2020   BUN 10 03/26/2020   CREATININE 0.86 03/26/2020   BILITOT 1.6 (H) 03/26/2020   ALKPHOS 105 03/26/2020   AST 80 (H) 03/26/2020   ALT 142 (H) 03/26/2020   PROT 7.9 03/26/2020   ALBUMIN 4.8 03/26/2020   CALCIUM 10.3 03/26/2020   ANIONGAP 10 02/06/2020   GFR 78.51 03/26/2020   Lab Results  Component Value Date   CHOL 246 (H) 11/29/2019   Lab Results  Component Value Date   HDL 43 (L) 11/29/2019   Lab Results  Component Value Date   [facility] [address]  11/29/2019     Comment:     . LDL cholesterol not calculated. Triglyceride levels greater than 400 mg/dL invalidate calculated LDL results. . Reference range: <100 . Desirable range <100 mg/dL for primary prevention;   <70 mg/dL for patients with CHD or diabetic patients  with > or = 2 CHD risk factors. Marland Kitchen LDL-C is now calculated using the  Martin-Hopkins  calculation, which is a validated novel method providing  better accuracy than the Friedewald equation in the  estimation of LDL-C.  Cresenciano Genre et al. Annamaria Helling. 2130;865(78): 2061-2068  (http://education.[employer].com/faq/FAQ164)    Lab Results  Component Value Date   TRIG 702 (H) 11/29/2019   Lab Results  Component Value Date   CHOLHDL 5.7 (H) 11/29/2019   Lab Results  Component Value Date   HGBA1C 6.5 (H) 11/29/2019      Assessment & Plan:   Problem List Items Addressed This Visit       Unprioritized   Cervical strain - Primary    Stretching  Muscle relaxers per orders  Can take celebrex bid prn  F/u pcp in 10-14 days if needed               Relevant Medications   tizanidine (ZANAFLEX) 2 MG capsule   Other Relevant Orders   DG Cervical Spine Complete   Other Visit Diagnoses     Chronic pain of both knees       Relevant Medications   celecoxib (CELEBREX) 200 MG capsule   tizanidine (ZANAFLEX) 2 MG capsule       Meds ordered this encounter  Medications   celecoxib (CELEBREX) 200 MG capsule    Sig: Take 1 capsule (200 mg total) by mouth 2 (two) times  daily as needed.    Dispense:  60 capsule    Refill:  1   tizanidine (ZANAFLEX) 2 MG capsule    Sig: Take 1 capsule (2 mg total) by mouth 3 (three) times daily as needed for muscle spasms.    Dispense:  45 capsule    Refill:  1    Follow-up: No follow-ups on file.    Ann Held, DO

## 2021-01-01 NOTE — Assessment & Plan Note (Signed)
Stretching  Muscle relaxers per orders  Can take celebrex bid prn  F/u pcp in 10-14 days if needed

## 2021-01-10 NOTE — Progress Notes (Addendum)
[place] at [facility] [address] [place],  [address] [phone]  Date:  01/12/2021   Name:  Lauren Mcclure   DOB:  02/15/1971   MRN:  [id number]  PCP:  Darreld Mclean, MD    Chief Complaint: Neck Injury (Pt was seen on 01/01/21 by Dr Etter Sjogren for neck strain. Was Rx: Celebrex 200 MG capsule and tizanidine 2 MG capsule. Pt says the meds help at night because it causes drowsiness. Pain has gone from a 10/10 to a 4/10 now. She still has HA in the back of her head though. Still doing stretches. //)   History of Present Illness:  Lauren Mcclure is a 50 y.o. very pleasant female patient who presents with the following:  Pt seen today for follow-up of neck pain - she was seen by Dr Etter Sjogren on 9/29.  Thought to be a neck strain, suggested stretching, muscle relaxer and celebrex as needed  Her neck has actually bothered her for approximately 8 months, got worse the last few weeks She did a massage, and is stretching Her pain decreased quite a bit She played tennis yesterday and felt some pain again, it may run into her left upper arm  She cannot recall any injury  She plays tennis on a regular basis  No CP noted, no shortness of breath  Shingles vaccine Pap screening - offered to update, pt wishes to see gyn for this.  Will refer her  Tetanus- will update today  Covid booster-encouraged Flu shot - declines  Labs done in December  Most recent pap done about 5 years ago    Patient Active Problem List   Diagnosis Date Noted   Cervical strain 01/01/2021   Genetic testing 06/13/2020   Family history of pancreatic cancer    Family history of breast cancer    Papillary thyroid carcinoma ([place]) 02/03/2020   Thyroid cyst 02/03/2020   Dyslipidemia 11/28/2019   Prediabetes 11/28/2019   Essential hypertension 05/26/2019    Past Medical History:  Diagnosis Date   Anxiety    Arthritis    Cancer ([place])    thyroid and skin    Elevated blood pressure reading    Family history of breast cancer    Family history of pancreatic cancer    Headache    History of hyperlipidemia    History of kidney stones    Hypertension    Kidney stones    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 03/24/2016   Chest X-Ray done 03/24/16 and was negative    Past Surgical History:  Procedure Laterality Date   LITHOTRIPSY     NASAL SEPTUM SURGERY  2007   SKIN CANCER EXCISION  04/08/2020   Left lower back    THYROIDECTOMY N/A 02/05/2020   Procedure: TOTAL THYROIDECTOMY;  Surgeon: Armandina Gemma, MD;  Location: [facility];  Service: General;  Laterality: N/A;   TONSILLECTOMY      Social History   Tobacco Use   Smoking status: Never   Smokeless tobacco: Never  Vaping Use   Vaping Use: Never used  Substance Use Topics   Alcohol use: Not Currently    Comment: Occ wine or beer   Drug use: No    Family History  Problem Relation Age of Onset   Hypertension Mother    Hyperlipidemia Mother    Pancreatic cancer Father 98       deceased age 55   Cancer - Other Paternal Grandmother  Skin cancer Paternal Grandfather    Heart disease Paternal Grandfather    Hyperlipidemia Maternal Aunt    Breast cancer Maternal Aunt        dx over 43   Breast cancer Other        dx over 59; MGMs sister   Pancreatic cancer Other        PGMs 3 brothers   Colon cancer Neg Hx    Esophageal cancer Neg Hx     Allergies  Allergen Reactions   Rocephin [Ceftriaxone Sodium In Dextrose] Rash    Medication list has been reviewed and updated.  Current Outpatient Medications on File Prior to Visit  Medication Sig Dispense Refill   amLODipine (NORVASC) 10 MG tablet TAKE 1 TABLET DAILY 90 tablet 3   Ascorbic Acid (VITAMIN C PO) Take 1 tablet by mouth daily as needed.     calcium carbonate (TUMS) 500 MG chewable tablet Chew 2 tablets (400 mg of elemental calcium total) by mouth 2 (two) times daily. (Patient taking differently: Chew 2 tablets by mouth as  needed.) 90 tablet 1   Calcium Carbonate-Vit D-Min (CALCIUM 1200 PO) Take 1 tablet by mouth as directed. 2 times a week     celecoxib (CELEBREX) 200 MG capsule Take 1 capsule (200 mg total) by mouth 2 (two) times daily as needed. 60 capsule 1   CRANBERRY PO Take 1 tablet by mouth daily.     levothyroxine (SYNTHROID) 100 MCG tablet Take 1 tablet (100 mcg total) by mouth daily before breakfast. 90 tablet 3   losartan (COZAAR) 50 MG tablet Take 1 tablet (50 mg total) by mouth daily. 90 tablet 3   Multiple Vitamin (MULTIVITAMIN) tablet Take 1 tablet by mouth daily as needed.     omeprazole (PRILOSEC) 20 MG capsule Take 1 capsule (20 mg total) by mouth 2 (two) times daily before a meal. Use Prilosec 20 mg twice daily for 6 weeks.  If symptoms controlled can decrease to 20 mg daily. 90 capsule 3   rosuvastatin (CRESTOR) 20 MG tablet Take 1 tablet (20 mg total) by mouth daily. (Patient taking differently: Take 20 mg by mouth as needed.) 90 tablet 3   tizanidine (ZANAFLEX) 2 MG capsule Take 1 capsule (2 mg total) by mouth 3 (three) times daily as needed for muscle spasms. 45 capsule 1   TURMERIC PO Take 1 tablet by mouth daily as needed (inflammation).     zolpidem (AMBIEN) 5 MG tablet TAKE 1/2 TO 1 TABLET(2.5 TO 5 MG) BY MOUTH AT BEDTIME AS NEEDED FOR SLEEP 15 tablet 2   No current facility-administered medications on file prior to visit.    Review of Systems: As per HPI- otherwise negative.  Physical Examination: Vitals:   01/12/21 1356  BP: 124/82  Pulse: 87  Resp: 18  Temp: 98.1 F (36.7 C)  SpO2: 98%   Vitals:   01/12/21 1356  Weight: 184 lb (83.5 kg)  Height: 5\' 6"  (1.676 m)   Body mass index is 29.7 kg/m. Ideal Body Weight: Weight in (lb) to have BMI = 25: 154.6  GEN: no acute distress.  Overweight, looks well HEENT: Atraumatic, Normocephalic.  Ears and Nose: No external deformity. CV: RRR, No M/G/R. No JVD. No thrill. No extra heart sounds. PULM: CTA B, no wheezes, crackles,  rhonchi. No retractions. No resp. distress. No accessory muscle use. EXTR: No c/c/e PSYCH: Normally interactive. Conversant.  She has widespread muscle spasm and tenderness over the left cervical paraspinous muscles, left trapezius.  Normal range of motion of the left arm.  Normal strength of both upper extremities  Assessment and Plan: Strain of neck muscle, initial encounter - Plan: predniSONE (DELTASONE) 20 MG tablet  Post-operative hypothyroidism - Plan: TSH, Thyroglobulin antibody, Thyroglobulin Level, T3, free  Prediabetes - Plan: Hemoglobin A1c  Essential hypertension - Plan: CBC, Comprehensive metabolic panel  Dyslipidemia - Plan: Lipid panel  Vitamin D deficiency - Plan: VITAMIN D 25 Hydroxy (Vit-D Deficiency, Fractures)  Immunization due - Plan: Tdap vaccine greater than or equal to 7yo IM  Screening for cervical cancer - Plan: Ambulatory referral to Obstetrics / Gynecology  Patient seen today for follow-up of a left neck strain.  This is actually been bothering her for about 8 months, escalated the last few weeks.  He has improved somewhat with muscle relaxers but is still quite bothersome.  We decided to try a short course of prednisone as Lauren Mcclure will let me know if this works for her  Updated tetanus  Referral to GYN for Pap Will plan further follow- up pending labs.   Signed Lamar Blinks, MD Received labs as below 10/11. She does not have mychart   A1c has gone up from 6.5 LFTs also up She saw GI earlier this year for LFTs- fatty liver  It looks like she did not follow-up for repeat GI eval and possible electrography with GI  She is not on medication for her DM at this time  Seen by endocrinology today- thyroid eval only  Tried pt on 10/13- no answer but she now has mychart set up- message to pt Results for orders placed or performed in visit on 01/12/21  CBC  Result Value Ref Range   WBC 6.5 4.0 - 10.5 K/uL   RBC 5.13 (H) 3.87 - 5.11 Mil/uL    Platelets 245.0 150.0 - 400.0 K/uL   Hemoglobin 14.4 12.0 - 15.0 g/dL   HCT 42.6 36.0 - 46.0 %   MCV 83.1 78.0 - 100.0 fl   MCHC 33.7 30.0 - 36.0 g/dL   RDW 14.0 11.5 - 15.5 %  Comprehensive metabolic panel  Result Value Ref Range   Sodium 138 135 - 145 mEq/L   Potassium 3.5 3.5 - 5.1 mEq/L   Chloride 101 96 - 112 mEq/L   CO2 26 19 - 32 mEq/L   Glucose, Bld 296 (H) 70 - 99 mg/dL   BUN 13 6 - 23 mg/dL   Creatinine, Ser 1.09 0.40 - 1.20 mg/dL   Total Bilirubin 1.6 (H) 0.2 - 1.2 mg/dL   Alkaline Phosphatase 147 (H) 39 - 117 U/L   AST 95 (H) 0 - 37 U/L   ALT 135 (H) 0 - 35 U/L   Total Protein 7.3 6.0 - 8.3 g/dL   Albumin 4.4 3.5 - 5.2 g/dL   GFR 58.74 (L) >60.00 mL/min   Calcium 9.6 8.4 - 10.5 mg/dL  Hemoglobin A1c  Result Value Ref Range   Hgb A1c MFr Bld 7.9 (H) 4.6 - 6.5 %  Lipid panel  Result Value Ref Range   Cholesterol 295 (H) 0 - 200 mg/dL   Triglycerides (H) 0.0 - 149.0 mg/dL    493.0 Triglyceride is over 400; calculations on Lipids are invalid.   HDL 42.00 >39.00 mg/dL   Total CHOL/HDL Ratio 7   TSH  Result Value Ref Range   TSH 1.05 0.35 - 5.50 uIU/mL  VITAMIN D 25 Hydroxy (Vit-D Deficiency, Fractures)  Result Value Ref Range   VITD 22.63 (L) 30.00 -  100.00 ng/mL  Thyroglobulin antibody  Result Value Ref Range   Thyroglobulin Ab <1 < or = 1 IU/mL  Thyroglobulin Level  Result Value Ref Range   Thyroglobulin 0.2 (L) ng/mL   Comment    T3, free  Result Value Ref Range   T3, Free 3.4 2.3 - 4.2 pg/mL  LDL cholesterol, direct  Result Value Ref Range   Direct LDL 189.0 mg/dL

## 2021-01-12 ENCOUNTER — Other Ambulatory Visit (HOSPITAL_BASED_OUTPATIENT_CLINIC_OR_DEPARTMENT_OTHER): Payer: Self-pay

## 2021-01-12 ENCOUNTER — Other Ambulatory Visit: Payer: Self-pay

## 2021-01-12 ENCOUNTER — Ambulatory Visit (INDEPENDENT_AMBULATORY_CARE_PROVIDER_SITE_OTHER): Payer: Managed Care, Other (non HMO) | Admitting: Family Medicine

## 2021-01-12 VITALS — BP 124/82 | HR 87 | Temp 98.1°F | Resp 18 | Ht 66.0 in | Wt 184.0 lb

## 2021-01-12 DIAGNOSIS — Z23 Encounter for immunization: Secondary | ICD-10-CM | POA: Diagnosis not present

## 2021-01-12 DIAGNOSIS — E890 Postprocedural hypothyroidism: Secondary | ICD-10-CM | POA: Diagnosis not present

## 2021-01-12 DIAGNOSIS — S161XXA Strain of muscle, fascia and tendon at neck level, initial encounter: Secondary | ICD-10-CM

## 2021-01-12 DIAGNOSIS — E559 Vitamin D deficiency, unspecified: Secondary | ICD-10-CM | POA: Diagnosis not present

## 2021-01-12 DIAGNOSIS — I10 Essential (primary) hypertension: Secondary | ICD-10-CM | POA: Diagnosis not present

## 2021-01-12 DIAGNOSIS — R7303 Prediabetes: Secondary | ICD-10-CM | POA: Diagnosis not present

## 2021-01-12 DIAGNOSIS — E785 Hyperlipidemia, unspecified: Secondary | ICD-10-CM

## 2021-01-12 DIAGNOSIS — Z124 Encounter for screening for malignant neoplasm of cervix: Secondary | ICD-10-CM

## 2021-01-12 MED ORDER — PREDNISONE 20 MG PO TABS
ORAL_TABLET | ORAL | 0 refills | Status: DC
Start: 2021-01-12 — End: 2021-10-12
  Filled 2021-01-12: qty 9, 6d supply, fill #0

## 2021-01-12 NOTE — Progress Notes (Signed)
Name: Lauren Mcclure  MRN/ DOB: [id number], [birth year]    Age/ Sex: 50 y.o., female     PCP: Copland, Gay Filler, MD   Reason for Endocrinology Evaluation: [facility]     Initial Endocrinology Clinic Visit: 01/08/2020    PATIENT IDENTIFIER: Lauren Mcclure is a 50 y.o., female with a past medical history of PTC. She has followed with [facility] since 01/08/2020 for consultative assistance with management of her PTC.   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 The tumor was unifocal , 1.2 cm in diameter on the left. Margins uninvolved . One L.N submitted with no involvement.    Due to hyperthyroid symptoms that was interfering with daily activities, we had reduced her LT-4 replacement dose in 03/2020  No  FH of thyroid disease   SUBJECTIVE:    Today (01/13/2021):  Lauren Mcclure is here for a follow up on PTC.   She takes LT-4 replacement appropriately  She has posterior neck pain, follows with PCP  Denies local neck symptoms  Weight has been steady, has been trying to stay active     Levothyroxine 100 mcg , daily   HISTORY:  Past Medical History:  Past Medical History:  Diagnosis Date   Anxiety    Arthritis    Cancer ([place])    thyroid and skin   Elevated blood pressure reading    Family history of breast cancer    Family history of pancreatic cancer    Headache    History of hyperlipidemia    History of kidney stones    Hypertension    Kidney stones    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 03/24/2016   Chest X-Ray done 03/24/16 and was negative   Past Surgical History:  Past Surgical History:  Procedure  Laterality Date   LITHOTRIPSY     NASAL SEPTUM SURGERY  2007   SKIN CANCER EXCISION  04/08/2020   Left lower back    THYROIDECTOMY N/A 02/05/2020   Procedure: TOTAL THYROIDECTOMY;  Surgeon: Lauren Gemma, MD;  Location: [facility];  Service: General;  Laterality: N/A;   TONSILLECTOMY     Social History:  reports that she has never smoked. She has never used smokeless tobacco. She reports that she does not currently use alcohol. She reports that she does not use drugs. Family History:  Family History  Problem Relation Age of Onset   Hypertension Mother    Hyperlipidemia Mother    Pancreatic cancer Father 82       deceased age 40   Cancer - Other Paternal Grandmother    Skin cancer Paternal Grandfather    Heart disease Paternal Grandfather    Hyperlipidemia Maternal Aunt    Breast cancer Maternal Aunt        dx over 35   Breast cancer Other        dx over 7; MGMs sister   Pancreatic cancer Other        PGMs 3 brothers   Colon cancer Neg Hx    Esophageal cancer Neg Hx      HOME MEDICATIONS: Allergies as of 01/13/2021  Reactions   Rocephin [ceftriaxone Sodium In Dextrose] Rash        Medication List        Accurate as of January 13, 2021 11:22 AM. If you have any questions, ask your nurse or doctor.          amLODipine 10 MG tablet Commonly known as: NORVASC TAKE 1 TABLET DAILY   CALCIUM 1200 PO Take 1 tablet by mouth as directed. 2 times a week   calcium carbonate 500 MG chewable tablet Commonly known as: Tums Chew 2 tablets (400 mg of elemental calcium total) by mouth 2 (two) times daily. What changed:  when to take this reasons to take this   celecoxib 200 MG capsule Commonly known as: CELEBREX Take 1 capsule (200 mg total) by mouth 2 (two) times daily as needed.   CRANBERRY PO Take 1 tablet by mouth daily.   levothyroxine 100 MCG tablet Commonly known as: SYNTHROID Take 1 tablet (100 mcg total) by mouth daily before breakfast.   losartan 50  MG tablet Commonly known as: COZAAR Take 1 tablet (50 mg total) by mouth daily.   multivitamin tablet Take 1 tablet by mouth daily as needed.   omeprazole 20 MG capsule Commonly known as: PRILOSEC Take 1 capsule (20 mg total) by mouth 2 (two) times daily before a meal. Use Prilosec 20 mg twice daily for 6 weeks.  If symptoms controlled can decrease to 20 mg daily.   predniSONE 20 MG tablet Commonly known as: DELTASONE Take 2 tablets by mouth daily for 3 days, then take 1 tablet daily for 3 days   rosuvastatin 20 MG tablet Commonly known as: Crestor Take 1 tablet (20 mg total) by mouth daily. What changed:  when to take this reasons to take this   tizanidine 2 MG capsule Commonly known as: ZANAFLEX Take 1 capsule (2 mg total) by mouth 3 (three) times daily as needed for muscle spasms.   TURMERIC PO Take 1 tablet by mouth daily as needed (inflammation).   VITAMIN C PO Take 1 tablet by mouth daily as needed.   zolpidem 5 MG tablet Commonly known as: AMBIEN TAKE 1/2 TO 1 TABLET(2.5 TO 5 MG) BY MOUTH AT BEDTIME AS NEEDED FOR SLEEP          OBJECTIVE:   PHYSICAL EXAM: VS: BP 118/72 (BP Location: Left Arm, Patient Position: Sitting, Cuff Size: Small)   Pulse 84   Ht '5\' 6"'  (1.676 m)   Wt 182 lb (82.6 kg)   LMP 09/04/2018 Comment: possible perimenopausal  SpO2 96%   BMI 29.38 kg/m    EXAM: General: Pt appears well and is in NAD  Neck: General: Supple without adenopathy. Thyroid:  No goiter or nodules appreciated.   Lungs: Clear with good BS bilat with no rales, rhonchi, or wheezes  Heart: Auscultation: RRR.  Abdomen: Normoactive bowel sounds, soft, nontender, without masses or organomegaly palpable  Extremities:  BL LE: No pretibial edema normal ROM and strength.  Mental Status: Judgment, insight: Intact Memory: Intact for recent and remote events Mood and affect: No depression, anxiety, or agitation     DATA REVIEWED:  Results for [name], [name]  (MRN [id number]) as of 01/14/2021 08:58  Ref. Range 01/12/2021 14:32  TSH Latest Ref Range: 0.35 - 5.50 uIU/mL 1.05  Triiodothyronine,Free,Serum Latest Ref Range: 2.3 - 4.2 pg/mL 3.4  Thyroglobulin Latest Units: ng/mL 0.2 (L)  Thyroglobulin Ab Latest Ref Range: < or = 1 IU/mL <1    Thyroid  Pathology 02/05/2020  FINAL MICROSCOPIC DIAGNOSIS:   A. THYROID, TOTAL, THYROIDECTOMY:  -  Papillary thyroid carcinoma, 1.2 cm  -  Benign lymph node (0/1)  -  Hyperplastic nodule with Hurthle cell changes (0.5 cm)  -  Benign cyst (2.4 cm)  -  See oncology table and comment below   ONCOLOGY TABLE:   THYROID GLAND:   Procedure: Thyroidectomy  Tumor Focality: Unifocal  Tumor Site: Left lobe  Tumor Size: 1.2 cm  Histologic Type: Papillary carcinoma, classic  Margins: Uninvolved by tumor  Angioinvasion: Not identified  Lymphatic Invasion: Not identified  Extrathyroidal extension: Not identified  Regional Lymph Nodes:       Number of Lymph Nodes Involved: 0       Nodal Levels Involved: 0       Size of Largest Metastatic Deposit: N/A       Extranodal Extension (ENE): N/A       Number of Lymph Nodes Examined: 1  Pathologic Stage Classification (pTNM, AJCC 8th Edition): pT1b, pN0   ASSESSMENT / PLAN / RECOMMENDATIONS:   Papillary Thyroid Carcinoma, Stage I :   - S/P total thyroidectomy on 02/05/2020 - She is at low risk for recurrence - TSH goal 0.5-2.0 uIU/mL if nonstimultated Tg is undetectable.  - TSH at goal  - Tg, Tg Ab undetectable  - Will proceed with thyroid bed ultrasound      2. Postoperative Hypothyroidism :  - She is clinically euthyroid  - TSh above goal , will increase levothyroxine  - Pt educated extensively on the correct way to take levothyroxine (first thing in the morning with water, 30 minutes before eating or taking other medications). - Pt encouraged to double dose the following day if she were to miss a dose given long half-life of  levothyroxine.   Medications  Continue   Levothyroxine 100 mcg,  1 tablet daily     F/U in 6 months     Signed electronically by: Mack Guise, MD  [facility]  [facility] [address] [place], [place] [address] Phone: [phone] FAX: [phone]      CC: Darreld Mclean, [address] [place] [address] Phone: [phone]  Fax: [phone]   Return to Endocrinology clinic as below: No future appointments.

## 2021-01-12 NOTE — Patient Instructions (Signed)
It was good to see you today I put in a referral for you to see GYN for your pap smear Tetanus booster today I would encourage you to get a flu shot and the new "bivalant" covid booster  Try the prednisone for your neck pain Let me know if not continuing to improve!

## 2021-01-13 ENCOUNTER — Ambulatory Visit (INDEPENDENT_AMBULATORY_CARE_PROVIDER_SITE_OTHER): Payer: Managed Care, Other (non HMO) | Admitting: Internal Medicine

## 2021-01-13 ENCOUNTER — Encounter: Payer: Self-pay | Admitting: Internal Medicine

## 2021-01-13 VITALS — BP 118/72 | HR 84 | Ht 66.0 in | Wt 182.0 lb

## 2021-01-13 DIAGNOSIS — E890 Postprocedural hypothyroidism: Secondary | ICD-10-CM

## 2021-01-13 DIAGNOSIS — C73 Malignant neoplasm of thyroid gland: Secondary | ICD-10-CM

## 2021-01-13 LAB — LIPID PANEL
Cholesterol: 295 mg/dL — ABNORMAL HIGH (ref 0–200)
HDL: 42 mg/dL (ref >39.00)
Total CHOL/HDL Ratio: 7
Triglycerides: 493 mg/dL — ABNORMAL HIGH (ref 0.0–149.0)

## 2021-01-13 LAB — CBC
HCT: 42.6 % (ref 36.0–46.0)
Hemoglobin: 14.4 g/dL (ref 12.0–15.0)
MCHC: 33.7 g/dL (ref 30.0–36.0)
MCV: 83.1 fl (ref 78.0–100.0)
Platelets: 245 10*3/uL (ref 150.0–400.0)
RBC: 5.13 Mil/uL — ABNORMAL HIGH (ref 3.87–5.11)
RDW: 14 % (ref 11.5–15.5)
WBC: 6.5 10*3/uL (ref 4.0–10.5)

## 2021-01-13 LAB — COMPREHENSIVE METABOLIC PANEL
ALT: 135 U/L — ABNORMAL HIGH (ref 0–35)
AST: 95 U/L — ABNORMAL HIGH (ref 0–37)
Albumin: 4.4 g/dL (ref 3.5–5.2)
Alkaline Phosphatase: 147 U/L — ABNORMAL HIGH (ref 39–117)
BUN: 13 mg/dL (ref 6–23)
CO2: 26 mEq/L (ref 19–32)
Calcium: 9.6 mg/dL (ref 8.4–10.5)
Chloride: 101 mEq/L (ref 96–112)
Creatinine, Ser: 1.09 mg/dL (ref 0.40–1.20)
GFR: 58.74 mL/min — ABNORMAL LOW (ref >60.00)
Glucose, Bld: 296 mg/dL — ABNORMAL HIGH (ref 70–99)
Potassium: 3.5 mEq/L (ref 3.5–5.1)
Sodium: 138 mEq/L (ref 135–145)
Total Bilirubin: 1.6 mg/dL — ABNORMAL HIGH (ref 0.2–1.2)
Total Protein: 7.3 g/dL (ref 6.0–8.3)

## 2021-01-13 LAB — THYROGLOBULIN ANTIBODY: Thyroglobulin Ab: <1 IU/mL (ref <=1)

## 2021-01-13 LAB — T3, FREE: T3, Free: 3.4 pg/mL (ref 2.3–4.2)

## 2021-01-13 LAB — THYROGLOBULIN LEVEL: Thyroglobulin: 0.2 ng/mL — ABNORMAL LOW

## 2021-01-13 LAB — VITAMIN D 25 HYDROXY (VIT D DEFICIENCY, FRACTURES): VITD: 22.63 ng/mL — ABNORMAL LOW (ref 30.00–100.00)

## 2021-01-13 LAB — TSH: TSH: 1.05 u[IU]/mL (ref 0.35–5.50)

## 2021-01-13 LAB — LDL CHOLESTEROL, DIRECT: Direct LDL: 189 mg/dL

## 2021-01-13 LAB — HEMOGLOBIN A1C: Hgb A1c MFr Bld: 7.9 % — ABNORMAL HIGH (ref 4.6–6.5)

## 2021-01-13 NOTE — Patient Instructions (Signed)

## 2021-01-15 ENCOUNTER — Encounter: Payer: Self-pay | Admitting: Family Medicine

## 2021-01-15 DIAGNOSIS — E119 Type 2 diabetes mellitus without complications: Secondary | ICD-10-CM

## 2021-01-16 MED ORDER — METFORMIN HCL 500 MG PO TABS: 500.0000 mg | ORAL_TABLET | Freq: Two times a day (BID) | ORAL | 3 refills | Status: DC

## 2021-01-19 ENCOUNTER — Telehealth: Payer: Self-pay | Admitting: General Surgery

## 2021-01-19 DIAGNOSIS — K760 Fatty (change of) liver, not elsewhere classified: Secondary | ICD-10-CM

## 2021-01-19 NOTE — Telephone Encounter (Signed)
Placed order for the patient to come to [facility] for FIB 4 at [facility] Primary care lab. Scheduled an appt for a follow up to discuss results. Detailed voicemail left for patient to contact me.

## 2021-01-19 NOTE — Telephone Encounter (Signed)
-----   Message from Pitkin, DO sent at 01/16/2021  3:56 PM EDT ----- Thanks Keane Scrape, appreciate you forwarding those results to me for review.   Lauren Mcclure, can you please reach out to this patient. Since AST/ALT still elevated, will plan for Fib-4 testing and f/u in the GI Clinic. 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 Thanks!  ----- Message ----- From: Darreld Mclean, MD Sent: 01/15/2021   4:13 PM EDT To: Lavena Bullion, DO  Hi Vito- I believe you guys had planned to repeat her LFTs around this time?  They are still elevated- from your notes I think you planned some next steps for her liver? Thank you!

## 2021-01-19 NOTE — Telephone Encounter (Signed)
Patient is returning your call.  

## 2021-01-21 ENCOUNTER — Other Ambulatory Visit: Payer: Self-pay | Admitting: Family Medicine

## 2021-01-21 ENCOUNTER — Other Ambulatory Visit: Payer: Self-pay

## 2021-01-21 ENCOUNTER — Telehealth: Payer: Self-pay | Admitting: General Surgery

## 2021-01-21 DIAGNOSIS — E785 Hyperlipidemia, unspecified: Secondary | ICD-10-CM

## 2021-01-21 NOTE — Progress Notes (Signed)
error 

## 2021-01-21 NOTE — Telephone Encounter (Signed)
Spoke with the patient and she will come to the clinic today or tomorrow to have lab drawn at Primary care on the second floor. Patient verbalized understanding of test to be performed and why.

## 2021-01-21 NOTE — Telephone Encounter (Signed)
Opened encounter in error  

## 2021-01-22 ENCOUNTER — Other Ambulatory Visit: Payer: Self-pay

## 2021-01-22 ENCOUNTER — Other Ambulatory Visit (INDEPENDENT_AMBULATORY_CARE_PROVIDER_SITE_OTHER): Payer: Managed Care, Other (non HMO)

## 2021-01-22 DIAGNOSIS — K760 Fatty (change of) liver, not elsewhere classified: Secondary | ICD-10-CM | POA: Diagnosis not present

## 2021-01-23 ENCOUNTER — Ambulatory Visit (HOSPITAL_BASED_OUTPATIENT_CLINIC_OR_DEPARTMENT_OTHER)
Admission: RE | Admit: 2021-01-23 | Discharge: 2021-01-23 | Disposition: A | Discharge status: Home / Self Care | Payer: Managed Care, Other (non HMO) | Source: Ambulatory Visit | Attending: Internal Medicine | Admitting: Internal Medicine

## 2021-01-23 DIAGNOSIS — C73 Malignant neoplasm of thyroid gland: Secondary | ICD-10-CM | POA: Diagnosis not present

## 2021-01-23 LAB — FIB-4 W/REFLEX NASH FIBROSURE
ALT: 129 IU/L — ABNORMAL HIGH (ref 0–32)
AST: 70 IU/L — ABNORMAL HIGH (ref 0–40)
FIB-4 Index: 0.97 (ref 0.00–2.67)
Platelets: 324 10*3/uL (ref 150–450)

## 2021-02-02 ENCOUNTER — Other Ambulatory Visit: Payer: Self-pay

## 2021-02-05 ENCOUNTER — Other Ambulatory Visit: Payer: Self-pay | Admitting: *Deleted

## 2021-02-05 ENCOUNTER — Other Ambulatory Visit: Payer: Self-pay

## 2021-02-05 ENCOUNTER — Encounter: Payer: Self-pay | Admitting: Gastroenterology

## 2021-02-05 ENCOUNTER — Ambulatory Visit (INDEPENDENT_AMBULATORY_CARE_PROVIDER_SITE_OTHER): Payer: Managed Care, Other (non HMO) | Admitting: Gastroenterology

## 2021-02-05 VITALS — BP 118/84 | HR 89 | Wt 179.0 lb

## 2021-02-05 DIAGNOSIS — Z86010 Personal history of colonic polyps: Secondary | ICD-10-CM

## 2021-02-05 DIAGNOSIS — R7401 Elevation of levels of liver transaminase levels: Secondary | ICD-10-CM

## 2021-02-05 DIAGNOSIS — Z800 Family history of malignant neoplasm of digestive organs: Secondary | ICD-10-CM

## 2021-02-05 DIAGNOSIS — K760 Fatty (change of) liver, not elsewhere classified: Secondary | ICD-10-CM | POA: Diagnosis not present

## 2021-02-05 DIAGNOSIS — K2100 Gastro-esophageal reflux disease with esophagitis, without bleeding: Secondary | ICD-10-CM

## 2021-02-05 DIAGNOSIS — G8929 Other chronic pain: Secondary | ICD-10-CM

## 2021-02-05 DIAGNOSIS — M25562 Pain in left knee: Secondary | ICD-10-CM

## 2021-02-05 DIAGNOSIS — E119 Type 2 diabetes mellitus without complications: Secondary | ICD-10-CM

## 2021-02-05 MED ORDER — METFORMIN HCL 500 MG PO TABS: 500.0000 mg | ORAL_TABLET | Freq: Two times a day (BID) | ORAL | 3 refills | Status: DC

## 2021-02-05 MED ORDER — CELECOXIB 200 MG PO CAPS: 200.0000 mg | ORAL_CAPSULE | Freq: Two times a day (BID) | ORAL | 0 refills | Status: DC | PRN

## 2021-02-05 MED ORDER — LOSARTAN POTASSIUM 50 MG PO TABS: 50.0000 mg | ORAL_TABLET | Freq: Every day | ORAL | 3 refills | Status: DC

## 2021-02-05 NOTE — Patient Instructions (Signed)
If you are age 51 or older, your body mass index should be between 23-30. Your Body mass index is 28.89 kg/m. If this is out of the aforementioned range listed, please consider follow up with your Primary Care Provider.  If you are age 9 or younger, your body mass index should be between 19-25. Your Body mass index is 28.89 kg/m. If this is out of the aformentioned range listed, please consider follow up with your Primary Care Provider.   __________________________________________________________  The [facility] [facility] providers would like to encourage you to use [facility] to communicate with providers for non-urgent requests or questions.  Due to long hold times on the telephone, sending your provider a message by [facility] may be a faster and more efficient way to get a response.  Please allow 48 business hours for a response.  Please remember that this is for non-urgent requests.   Please go to the lab on the 2nd floor [address]  in 3 months to have your liver enzymes checked.  We would like to see you again after your labs in February 2023.  Due to recent changes in healthcare laws, you may see the results of your imaging and laboratory studies on MyChart before your provider has had a chance to review them.  We understand that in some cases there may be results that are confusing or concerning to you. Not all laboratory results come back in the same time frame and the provider may be waiting for multiple results in order to interpret others.  Please give [place] 48 hours in order for your provider to thoroughly review all the results before contacting the office for clarification of your results.   You will receive a call from the Nutrition department to schedule an appointment.  Thank you for choosing me and [facility].  Vito Cirigliano, D.O.

## 2021-02-05 NOTE — Progress Notes (Signed)
Chief Complaint:    Elevated liver enzymes, fatty liver disease  GI History: 50 y.o. 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  Otherwise no GI symptoms. - Colonoscopy completed 05/2020 as below. Rpeeat in 5 years.    2) Elevated liver enzymes, hepatic steatosis, Gilberts syndrome: -T bili elevated (0.9-2.0) since 2017, all indirect on multiple panels c/w benign Gilbert's -AST/ALT normal in 03/2016 with slow uptrend in AST<ALT since then.  Previous peak AST/ALT 96/133 in 11/2019, then down trended for a few months, and back up again in 03/2020 at 80/142.  Otherwise been normal ALP and albumin.  Normal renal function -RUQ [place] (9/21): Hepatic steatosis, 8 mm x 7 mm x 7 mm indeterminate right hepatic lobe lesion.  1 cm right hepatic dome cyst.  No gallstones, normal CBD -MRI liver (10/21): Right hepatic lobe cyst, marked steatosis, small hemangioma vs benign vascular shunt in right hepatic lobe, GB sludge. -Vitamin D 23 -TSH 0.14 -Negative hepatitis C (11/2019) -04/2020: Negative/normal TTG, AMA, ANA, ASMA, IgM, IgG, IgA, A1 AT, viral hepatitis panel, iron panel - 01/2021: Fib 4 score 0.97 (low risk for advanced liver disease).  AST/ALT 70/129   3) RUQ discomfort.  Not described as pain.  Started May 2021 without clear alleviating/exacerbating factors.  Some associated nausea, but no emesis. -HIDA (05/2020): Normal   4) Family history: Father died of Pancreatic CA at age 23. Brother with some liver issues, but she isnt sure the details. Neither of them drank at all. No other known FHx of pancreaticobiliary disease. No known family history of CRC, GI malignancy, liver disease, or IBD.  -Refer to genetics clinic.  Negative gene testing on  06/11/2020.  No recommendation for increased screening measures   5) GERD with reflux esophagitis.  Index sxs of HB and MEG discomfort. No regurgitation or dysphagia. EE diagnosed at time of EGD 05/2020.  Started on Prilosec 20 mg bid x6 weeks, then reduce to 20 mg/day.   Endoscopic History: -EGD (05/2020, Dr. Bryan Lemma): LA Grade A esophagitis, Hill grade 2, 3 mm gastric hyperplastic polyp, Otherwise normal stomach and duodenum (path benign) -Colonoscopy (05/2020, Dr. Bryan Lemma): 4 polyps (SSP x1, HP x3), 5 rectal hyperplastic polyps, grade 1 internal hemorrhoids.  Normal TI.  Repeat in 5 years  HPI:     Patient is a 50 y.o. female presenting to the Gastroenterology Clinic for follow-up.  Last seen by me on 07/09/2020 for ongoing evaluation of elevated liver enzymes and fatty liver disease.  Increased vegetables. Decreased fruits and sugar-containing foods. Has intentionally lost 5# in the last 3-4 weeks with dieting (had previously gained weight earlier this year due to inactivity). Started drinking black coffee. Started walking daily for >1 hour at a time and tennis on the weekends.    Had thyroid surgery 02/2020.  Reflux symptoms well controlled on current therapy.  She is otherwise without any complaints today.   Review of systems:     No chest pain, no SOB, no fevers, no urinary sx   Past Medical History:  Diagnosis Date   Anxiety    Arthritis    Cancer ([place])    thyroid and skin   Elevated blood pressure reading    Family history of breast cancer    Family history of pancreatic cancer  Headache    History of hyperlipidemia    History of kidney stones    Hypertension    Kidney stones    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 03/24/2016   Chest X-Ray done 03/24/16 and was negative    Patient's surgical history, family medical history, social history, medications and allergies were all reviewed in Epic    Current Outpatient Medications  Medication Sig Dispense Refill    amLODipine (NORVASC) 10 MG tablet TAKE 1 TABLET DAILY 90 tablet 3   Ascorbic Acid (VITAMIN C PO) Take 1 tablet by mouth daily as needed.     calcium carbonate (TUMS) 500 MG chewable tablet Chew 2 tablets (400 mg of elemental calcium total) by mouth 2 (two) times daily. (Patient taking differently: Chew 2 tablets by mouth as needed.) 90 tablet 1   Calcium Carbonate-Vit D-Min (CALCIUM 1200 PO) Take 1 tablet by mouth as directed. 2 times a week     celecoxib (CELEBREX) 200 MG capsule Take 1 capsule (200 mg total) by mouth 2 (two) times daily as needed. 60 capsule 1   CRANBERRY PO Take 1 tablet by mouth daily.     levothyroxine (SYNTHROID) 100 MCG tablet Take 1 tablet (100 mcg total) by mouth daily before breakfast. 90 tablet 3   losartan (COZAAR) 50 MG tablet Take 1 tablet (50 mg total) by mouth daily. 90 tablet 3   metFORMIN (GLUCOPHAGE) 500 MG tablet Take 1 tablet (500 mg total) by mouth 2 (two) times daily with a meal. 180 tablet 3   Multiple Vitamin (MULTIVITAMIN) tablet Take 1 tablet by mouth daily as needed.     omeprazole (PRILOSEC) 20 MG capsule Take 1 capsule (20 mg total) by mouth 2 (two) times daily before a meal. Use Prilosec 20 mg twice daily for 6 weeks.  If symptoms controlled can decrease to 20 mg daily. 90 capsule 3   predniSONE (DELTASONE) 20 MG tablet Take 2 tablets by mouth daily for 3 days, then take 1 tablet daily for 3 days 9 tablet 0   rosuvastatin (CRESTOR) 20 MG tablet TAKE 1 TABLET DAILY 90 tablet 1   tizanidine (ZANAFLEX) 2 MG capsule Take 1 capsule (2 mg total) by mouth 3 (three) times daily as needed for muscle spasms. 45 capsule 1   TURMERIC PO Take 1 tablet by mouth daily as needed (inflammation).     zolpidem (AMBIEN) 5 MG tablet TAKE 1/2 TO 1 TABLET(2.5 TO 5 MG) BY MOUTH AT BEDTIME AS NEEDED FOR SLEEP 15 tablet 2   No current facility-administered medications for this visit.    Physical Exam:     LMP 09/04/2018 Comment: possible perimenopausal  GENERAL:   Pleasant female in NAD PSYCH: : Cooperative, normal affect Musculoskeletal:  Normal muscle tone, normal strength NEURO: Alert and oriented x 3, no focal neurologic deficits   IMPRESSION and PLAN:    1) NAFLD - Applauded her recent efforts at dieting and exercise/increased activity - Goal 10% body weight loss done slowly over weeks to months through diet/exercise - Repeat liver enzymes in 3-6 months - Recent fib 4 score with low risk for advanced liver disease (score 0.97).  Recommend repeat fib 4 score testing every 2-3 years - Extended serologic work-up otherwise unrevealing for concomitant liver disease - Per patient request, referral to Nutrition Clinic for assistance in appropriate dieting - If enzymes continue to uptrend on repeat despite appropriate diet/exercise modifications, can perform ultrasound elastography +/- liver biopsy pending results  2) Overweight - Continue diet/exercise as  above - Intentional 5 pound weight loss over the last couple of weeks  3) GERD with erosive esophagitis - Reflux well controlled with Prilosec 20 mg bid.  Can reduce to daily dosing as tolerated - Continue antireflux lifestyle/dietary modifications  4) Family history of pancreatic cancer -Underwent genetic testing 06/2020, unrevealing for any genetic mutations -Does not fit CAPS consortium guidelines for increased risk screening protocol   5) History of colon polyps -Repeat colonoscopy 2027 for ongoing polyp surveillance   RTC in 3-6 months or sooner as needed     I spent 30 minutes of time, including in depth chart review, independent review of results as outlined above, communicating results with the patient directly, face-to-face time with the patient, coordinating care, and ordering studies and medications as appropriate, and documentation.   Lavena Bullion ,DO, FACG 02/05/2021, 11:29 AM

## 2021-02-25 ENCOUNTER — Other Ambulatory Visit: Payer: Self-pay

## 2021-02-25 MED ORDER — OMEPRAZOLE 20 MG PO CPDR: 20.0000 mg | DELAYED_RELEASE_CAPSULE | Freq: Two times a day (BID) | ORAL | 3 refills | Status: DC

## 2021-03-11 ENCOUNTER — Telehealth: Payer: Self-pay | Admitting: Internal Medicine

## 2021-03-11 MED ORDER — LEVOTHYROXINE SODIUM 100 MCG PO TABS: 100.0000 ug | ORAL_TABLET | Freq: Every day | ORAL | 1 refills | Status: DC

## 2021-03-11 NOTE — Telephone Encounter (Signed)
Script sent  

## 2021-03-11 NOTE — Telephone Encounter (Signed)
MEDICATION: Levothyroxine 100 MCG   PHARMACY:  [facility] [employer] [facility] [address] in [place] CONTACTED THEIR PHARMACY?  yes  IS THIS A 90 DAY SUPPLY : YES  IS PATIENT OUT OF MEDICATION: no  IF NOT; HOW MUCH IS LEFT: has 2 days left  LAST APPOINTMENT DATE: @10 /31/2022  NEXT APPOINTMENT DATE:@04 /02/2022 [place]  DO WE HAVE YOUR PERMISSION TO LEAVE A DETAILED MESSAGE?: yes  OTHER COMMENTS:    **Let patient know to contact pharmacy at the end of the day to make sure medication is ready. **  ** Please notify patient to allow 48-72 hours to process**  **Encourage patient to contact the pharmacy for refills or they can request refills through [facility]**

## 2021-03-13 ENCOUNTER — Ambulatory Visit (INDEPENDENT_AMBULATORY_CARE_PROVIDER_SITE_OTHER): Payer: Managed Care, Other (non HMO) | Admitting: Obstetrics and Gynecology

## 2021-03-13 ENCOUNTER — Other Ambulatory Visit (HOSPITAL_COMMUNITY)
Admission: RE | Admit: 2021-03-13 | Discharge: 2021-03-13 | Disposition: A | Discharge status: Home / Self Care | Payer: Managed Care, Other (non HMO) | Source: Ambulatory Visit | Attending: Obstetrics and Gynecology | Admitting: Obstetrics and Gynecology

## 2021-03-13 ENCOUNTER — Other Ambulatory Visit: Payer: Self-pay

## 2021-03-13 ENCOUNTER — Encounter: Payer: Self-pay | Admitting: Obstetrics and Gynecology

## 2021-03-13 VITALS — BP 102/55 | HR 71 | Wt 174.0 lb

## 2021-03-13 DIAGNOSIS — Z01419 Encounter for gynecological examination (general) (routine) without abnormal findings: Secondary | ICD-10-CM | POA: Insufficient documentation

## 2021-03-13 LAB — POCT URINALYSIS DIPSTICK
Bilirubin, UA: NEGATIVE
Blood, UA: NEGATIVE
Glucose, UA: NEGATIVE
Ketones, UA: NEGATIVE
Leukocytes, UA: NEGATIVE
Nitrite, UA: NEGATIVE
Protein, UA: NEGATIVE
Spec Grav, UA: 1.015 (ref 1.010–1.025)
Urobilinogen, UA: 0.2 E.U./dL
pH, UA: 5 (ref 5.0–8.0)

## 2021-03-13 NOTE — Progress Notes (Signed)
Subjective:     Lauren Mcclure is a 50 y.o. female P0 with BMI 28 postmenopausal who is here for a comprehensive physical exam. The patient reports no problems. Patient is not sexually active. She denies pelvic pain or abnormal discharge. She denies any episodes of postmenopausal vaginal bleeding. She admits to urinary urgency particularly in the middle of the night. She reports a recent diagnosis of diabetes and has been very strict in adhering to the diet. Patient is without any other complaints  Past Medical History:  Diagnosis Date   Anxiety    Arthritis    Cancer ([place])    thyroid and skin   Elevated blood pressure reading    Family history of breast cancer    Family history of pancreatic cancer    Headache    History of hyperlipidemia    History of kidney stones    Hypertension    Kidney stones    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 03/24/2016   Chest X-Ray done 03/24/16 and was negative   Past Surgical History:  Procedure Laterality Date   LITHOTRIPSY     NASAL SEPTUM SURGERY  2007   SKIN CANCER EXCISION  04/08/2020   Left lower back    THYROIDECTOMY N/A 02/05/2020   Procedure: TOTAL THYROIDECTOMY;  Surgeon: Armandina Gemma, MD;  Location: [facility];  Service: General;  Laterality: N/A;   TONSILLECTOMY      Family History  Problem Relation Age of Onset   Hypertension Mother    Hyperlipidemia Mother    Pancreatic cancer Father 22       deceased age 50   Cancer - Other Paternal Grandmother    Skin cancer Paternal Grandfather    Heart disease Paternal Grandfather    Hyperlipidemia Maternal Aunt    Breast cancer Maternal Aunt        dx over 61   Breast cancer Other        dx over 2; MGMs sister   Pancreatic cancer Other        PGMs 3 brothers   Colon cancer Neg Hx    Esophageal cancer Neg Hx      Social History   Socioeconomic History   Marital status: Married    Spouse name: Not on file   Number of children: Not on file   Years of education: Not on file    Highest education level: Not on file  Occupational History   Not on file  Tobacco Use   Smoking status: Never   Smokeless tobacco: Never  Vaping Use   Vaping Use: Never used  Substance and Sexual Activity   Alcohol use: Not Currently    Comment: Occ wine or beer   Drug use: No   Sexual activity: Yes    Birth control/protection: None  Other Topics Concern   Not on file  Social History Narrative   Not on file   Social Determinants of Health   Financial Resource Strain: Not on file  Food Insecurity: Not on file  Transportation Needs: Not on file  Physical Activity: Not on file  Stress: Not on file  Social Connections: Not on file  Intimate Partner Violence: Not on file   Health Maintenance  Topic Date Due   Pneumococcal Vaccine 32-53 Years old (1 - PCV) Never done   FOOT EXAM  Never done   OPHTHALMOLOGY EXAM  Never done   Zoster Vaccines- Shingrix (1 of 2) Never done   PAP SMEAR-Modifier  04/05/2017   COVID-19  Vaccine (3 - Pfizer risk series) 10/29/2019   INFLUENZA VACCINE  11/03/2020   HEMOGLOBIN A1C  07/13/2021   MAMMOGRAM  06/02/2022   COLONOSCOPY (Pts 45-27yrs Insurance coverage will need to be confirmed)  05/19/2025   TETANUS/TDAP  01/13/2031   Hepatitis C Screening  Completed   HIV Screening  Completed   HPV VACCINES  Aged Out       Review of Systems Pertinent items noted in HPI and remainder of comprehensive ROS otherwise negative.   Objective:  Blood pressure (!) 102/55, pulse 71, weight 174 lb (78.9 kg), last menstrual period 09/04/2018.   GENERAL: Well-developed, well-nourished female in no acute distress.  HEENT: Normocephalic, atraumatic. Sclerae anicteric.  NECK: Supple. Normal thyroid.  LUNGS: Clear to auscultation bilaterally.  HEART: Regular rate and rhythm. BREASTS: Symmetric in size. No palpable masses or lymphadenopathy, skin changes, or nipple drainage. ABDOMEN: Soft, nontender, nondistended. No organomegaly. PELVIC: Normal external  female genitalia. Vagina is pink and rugated.  Normal discharge. Normal appearing cervix. Uterus is normal in size. No adnexal mass or tenderness. Chaperone present during the pelvic exam EXTREMITIES: No cyanosis, clubbing, or edema, 2+ distal pulses.     Assessment:    Healthy female exam.      Plan:    Pap smear collected Screening mammogram normal 06/2020 Patient had a colonoscopy Urine culture collected due to symptoms of urgency. Patient admits to drinking a lot of water and was advised to limit fluid intake 2-3 hours before bedtime.  Patient plans to follow up with PCP of diabetes control in February Patient will be contacted with abnormal results See After Visit Summary for Counseling Recommendations

## 2021-03-17 LAB — CYTOLOGY - PAP
Adequacy: ABSENT
Comment: NEGATIVE
Diagnosis: NEGATIVE
High risk HPV: NEGATIVE

## 2021-03-18 LAB — URINE CULTURE

## 2021-04-13 ENCOUNTER — Encounter: Payer: Managed Care, Other (non HMO) | Attending: Gastroenterology | Admitting: Registered"

## 2021-04-13 ENCOUNTER — Other Ambulatory Visit: Payer: Self-pay

## 2021-04-13 ENCOUNTER — Encounter: Payer: Self-pay | Admitting: Registered"

## 2021-04-13 DIAGNOSIS — R7401 Elevation of levels of liver transaminase levels: Secondary | ICD-10-CM | POA: Diagnosis present

## 2021-04-13 NOTE — Patient Instructions (Addendum)
[email or web address]gl to find information on supplements 3rd party testing company  [employer]States.com.pt for recipes that you can filter by carb, cuisine, sodium  A pinch of baking soda in coffee to reduce acidity Avoid high fructose foods, fried foods, saturated fats Check ingredients for fructose. Aim to eat balanced meals Foods to include: berries, cruciferous vegetables (broccoli, kale, cabbage, Brussels sprouts, etc) beans, whole grains (including oatmeal), nuts, and fatty fish.  Coffee and green tea contain antioxidants that are helpful for liver health. Avoid direct contact with chemicals such as cleaning products, wear gloves.  Continue playing tennis, consider walking up and downstairs for 5 min 2x/day  Get good quality sleep

## 2021-04-13 NOTE — Progress Notes (Signed)
Medical Nutrition Therapy  Appointment Start time:  [phone]  Appointment End time:  1110  Primary concerns today: High cholesterol, liver enzymes, A1c 7.9% Referral diagnosis: R74.01 (ICD-10-CM) - Elevated ALT measurement Preferred learning style: no preference indicated Learning readiness: ready, change in progress   NUTRITION ASSESSMENT   Anthropometrics  Wt Readings from Last 3 Encounters:  03/13/21 174 lb (78.9 kg)  02/05/21 179 lb (81.2 kg)  01/13/21 182 lb (82.6 kg)      Clinical Medical Hx: cancer, thyroidectomy, skin cancer. Medications: metformin 500 bid started 02/2021 Labs: A1c 7.9%; TG 493 mg/dL; Vit D 22.63 (L) Dr. Bryan Lemma notes for liver function on 01/2021: Fib 4 score 0.97 (low risk for advanced liver disease).  AST/ALT 70/129 Notable Signs/Symptoms: weight gain, elevated A1c  Lifestyle & Dietary Hx Pt states after she was diagnosed with diabetes 2 months ago she eats less rice and and bread, stopped drinking soda and has reduced sweets to 70% of what she used to eat.   Pt states she gained weight last year after surgeries related to thyroid and skin cancer. Pt reports UBW was 162-165 lb and after surgery was up to 185 lb. Pt states she wants to lose weight to address health issues including diabetes, high cholesterol & blood pressure and liver issues.  Pt states she stopped working when going through cancer treatments last year. Pt reports when her husband is home may go walking. Pt states she spends her time reading, cleaning the house, watching TV.  Estimated daily fluid intake: "a lot of water" oz Supplements: multivitamin sometimes, stopped taking tumeric last year Sleep: "good sleep" Stress / self-care: not assessed Current average weekly physical activity: Tennis 2-3x/week,   24-Hr Dietary Recall First Meal: keto cereal, 2% milk lactaid, banana, blue berries Steamed plantain Snack: apples, pears Second Meal: ham, 2 Hawaiian rolls Snack:  Third Meal:  shashmi Snack: fruit Beverages: coffee flavored cream, water, green tea, oolong, jasmin   NUTRITION DIAGNOSIS  NB-1.1 Food and nutrition-related knowledge deficit As related to balanced eating.  As evidenced by pt states new knowledge gained regarding myplate.   NUTRITION INTERVENTION  Nutrition education (E-1) on the following topics:  MyPlate Whole grains Sat fat Added sugar  Handouts Provided Include  MyPlate for diabetes Nutrition facts label  Learning Style & Readiness for Change Teaching method utilized: Visual & Auditory  Demonstrated degree of understanding via: Teach Back  Barriers to learning/adherence to lifestyle change: none  Goals Established by Pt [email or web address]gl to find information on supplements 3rd party testing company  [employer]States.com.pt for recipes that you can filter by carb, cuisine, sodium  A pinch of baking soda in coffee to reduce acidity Avoid high fructose foods, fried foods, saturated fats Check ingredients for fructose. Aim to eat balanced meals Foods to include: berries, cruciferous vegetables (broccoli, kale, cabbage, Brussels sprouts, etc) beans, whole grains (including oatmeal), nuts, and fatty fish.  Coffee and green tea contain antioxidants that are helpful for liver health. Avoid direct contact with chemicals such as cleaning products, wear gloves.  Continue playing tennis, consider walking up and downstairs for 5 min 2x/day  Get good quality sleep  MONITORING & EVALUATION Dietary intake, weekly physical activity, and A1c in 2 months.  Next Steps  Patient is to get referral for diabetes education from PCP and return for more focused education on diabetes.

## 2021-07-14 ENCOUNTER — Encounter: Payer: Self-pay | Admitting: Internal Medicine

## 2021-07-14 ENCOUNTER — Ambulatory Visit (INDEPENDENT_AMBULATORY_CARE_PROVIDER_SITE_OTHER): Payer: Managed Care, Other (non HMO) | Admitting: Internal Medicine

## 2021-07-14 VITALS — BP 106/70 | HR 61 | Ht 66.0 in | Wt 174.8 lb

## 2021-07-14 DIAGNOSIS — R739 Hyperglycemia, unspecified: Secondary | ICD-10-CM | POA: Diagnosis not present

## 2021-07-14 DIAGNOSIS — C73 Malignant neoplasm of thyroid gland: Secondary | ICD-10-CM | POA: Diagnosis not present

## 2021-07-14 DIAGNOSIS — E890 Postprocedural hypothyroidism: Secondary | ICD-10-CM

## 2021-07-14 DIAGNOSIS — E119 Type 2 diabetes mellitus without complications: Secondary | ICD-10-CM

## 2021-07-14 LAB — POCT GLYCOSYLATED HEMOGLOBIN (HGB A1C): Hemoglobin A1C: 6.4 % — AB (ref 4.0–5.6)

## 2021-07-14 LAB — MICROALBUMIN / CREATININE URINE RATIO
Creatinine,U: 138.6 mg/dL
Microalb Creat Ratio: 1.7 mg/g (ref 0.0–30.0)
Microalb, Ur: 2.3 mg/dL — ABNORMAL HIGH (ref 0.0–1.9)

## 2021-07-14 LAB — TSH: TSH: 0.18 u[IU]/mL — ABNORMAL LOW (ref 0.35–5.50)

## 2021-07-14 MED ORDER — FREESTYLE LIBRE 2 SENSOR MISC: 1.0000 | 3 refills | Status: DC

## 2021-07-14 MED ORDER — ONETOUCH VERIO W/DEVICE KIT: 1.0000 | PACK | Freq: Every day | 0 refills | Status: AC

## 2021-07-14 MED ORDER — ONETOUCH VERIO VI STRP: 1.0000 | ORAL_STRIP | Freq: Every day | 3 refills | Status: DC

## 2021-07-14 NOTE — Progress Notes (Signed)
? ?Name: Lauren Mcclure  ?MRN/ DOB: [id number], [birth year]    ?Age/ Sex: 51 y.o., female   ? ? ?PCP: Copland, Gay Filler, MD   ?Reason for Endocrinology Evaluation: PTC  ?   ?Initial Endocrinology Clinic Visit: 01/08/2020  ? ? ?PATIENT IDENTIFIER: [name] is a 51 y.o., female with a past medical history of PTC. She has followed with [facility] since 01/08/2020 for consultative assistance with management of her PTC.  ? ?HISTORICAL SUMMARY:  ? 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 ?She is S/P total thyroidectomy on 02/05/2020. The tumor was unifocal , 1.2 cm in diameter on the left. Margins uninvolved . One L.N submitted with no involvement.  ? ? ?Due to hyperthyroid symptoms that was interfering with daily activities, we had reduced her LT-4 replacement dose in 03/2020 ? ?No  FH of thyroid disease  ? ? ?DIABETES HISTORY: ?Ms. Mortellaro has been diagnosed with T2DM in 2021, she was managed with lifestyle changes until her A1c increased to 7.9% in 01/2021 and she was started on metformin through her PCP. ? ?SUBJECTIVE:  ? ? ?Today (07/14/2021):  [name] is here for a follow up on PTC.  ? ?She was noted to have an A1c of 7.9 % in October, 2022. She is on Metformin 500 mg BID . She has not been checking glucose at home , as she does not have a glucose meter at home. ? ? ?Denies nausea, vomiting or diarrhea  ?Denies local neck swelling  ?She has been noted with weight loss  ?She denies constipation  ?Has rare palpitations  ? ? ? ?Levothyroxine 100 mcg , daily  ? ? ? ? ?HISTORY:  ?Past Medical History:  ?Past Medical History:  ?Diagnosis Date  ? Anxiety   ? Arthritis   ? Cancer [facility] [place])   ? thyroid and skin  ? Elevated  blood pressure reading   ? Family history of breast cancer   ? Family history of pancreatic cancer   ? Headache   ? History of hyperlipidemia   ? History of kidney stones   ? Hypertension   ? Kidney stones   ? 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 03/24/2016  ? Chest X-Ray done 03/24/16 and was negative  ? ?Past Surgical History:  ?Past Surgical History:  ?Procedure Laterality Date  ? LITHOTRIPSY    ? NASAL SEPTUM SURGERY  2007  ? SKIN CANCER EXCISION  04/08/2020  ? Left lower back   ? THYROIDECTOMY N/A 02/05/2020  ? Procedure: TOTAL THYROIDECTOMY;  Surgeon: Armandina Gemma, MD;  Location: [facility];  Service: General;  Laterality: N/A;  ? TONSILLECTOMY    ? ?Social History:  reports that she has never smoked. She has never used smokeless tobacco. She reports that she does not currently use alcohol. She reports that she does not use drugs. ?Family History:  ?Family History  ?Problem Relation Age of Onset  ? Hypertension Mother   ? Hyperlipidemia Mother   ? Pancreatic cancer Father 40  ?     deceased age 26  ? Cancer - Other Paternal Grandmother   ? Skin cancer Paternal Grandfather   ? Heart disease Paternal Grandfather   ? Hyperlipidemia Maternal  Aunt   ? Breast cancer Maternal Aunt   ?     dx over 69  ? Breast cancer Other   ?     dx over 67; MGMs sister  ? Pancreatic cancer Other   ?     PGMs 3 brothers  ? Colon cancer Neg Hx   ? Esophageal cancer Neg Hx   ? ? ? ?HOME MEDICATIONS: ?Allergies as of 07/14/2021   ? ?   Reactions  ? Rocephin [ceftriaxone Sodium In Dextrose] Rash  ? ?  ? ?  ?Medication List  ?  ? ?  ? Accurate as of July 14, 2021 12:07 PM. If you have any questions, ask your nurse or doctor.  ?  ?  ? ?  ? ?amLODipine 10 MG tablet ?Commonly known as: NORVASC ?TAKE 1 TABLET DAILY ?  ?CALCIUM 1200 PO ?Take 1 tablet by mouth as directed. 2 times a week ?  ?calcium carbonate 500 MG chewable tablet ?Commonly known as: Tums ?Chew 2 tablets (400 mg of elemental calcium total) by mouth 2 (two) times daily. ?  ?celecoxib  200 MG capsule ?Commonly known as: CELEBREX ?Take 1 capsule (200 mg total) by mouth 2 (two) times daily as needed. ?  ?CRANBERRY PO ?Take 1 tablet by mouth daily. ?  ?FreeStyle Libre 2 Sensor Misc ?1 Device by Does not apply route every 14 (fourteen) days. ?Started by: Dorita Sciara, MD ?  ?levothyroxine 100 MCG tablet ?Commonly known as: SYNTHROID ?Take 1 tablet (100 mcg total) by mouth daily before breakfast. ?  ?losartan 50 MG tablet ?Commonly known as: COZAAR ?Take 1 tablet (50 mg total) by mouth daily. ?  ?metFORMIN 500 MG tablet ?Commonly known as: GLUCOPHAGE ?Take 1 tablet (500 mg total) by mouth 2 (two) times daily with a meal. ?  ?multivitamin tablet ?Take 1 tablet by mouth daily as needed. ?  ?omeprazole 20 MG capsule ?Commonly known as: PRILOSEC ?Take 1 capsule (20 mg total) by mouth 2 (two) times daily before a meal. Use Prilosec 20 mg twice daily for 6 weeks.  If symptoms controlled can decrease to 20 mg daily. ?  ?OneTouch Verio test strip ?Generic drug: glucose blood ?1 each by Other route daily in the afternoon. Use as instructed ?Started by: Dorita Sciara, MD ?  ?OneTouch Verio w/Device Kit ?1 Device by Does not apply route daily in the afternoon. ?Started by: Dorita Sciara, MD ?  ?predniSONE 20 MG tablet ?Commonly known as: DELTASONE ?Take 2 tablets by mouth daily for 3 days, then take 1 tablet daily for 3 days ?  ?rosuvastatin 20 MG tablet ?Commonly known as: CRESTOR ?TAKE 1 TABLET DAILY ?  ?tizanidine 2 MG capsule ?Commonly known as: ZANAFLEX ?Take 1 capsule (2 mg total) by mouth 3 (three) times daily as needed for muscle spasms. ?  ?TURMERIC PO ?Take 1 tablet by mouth daily as needed (inflammation). ?  ?VITAMIN C PO ?Take 1 tablet by mouth daily as needed. ?  ?zolpidem 5 MG tablet ?Commonly known as: AMBIEN ?TAKE 1/2 TO 1 TABLET(2.5 TO 5 MG) BY MOUTH AT BEDTIME AS NEEDED FOR SLEEP ?  ? ?  ? ? ? ? ?OBJECTIVE:  ? ?PHYSICAL EXAM: ?VS: BP 106/70 (BP Location: Left Arm, Patient  Position: Sitting, Cuff Size: Small)   Pulse 61   Ht '5\' 6"'  (1.676 m)   Wt 174 lb 12.8 oz (79.3 kg)   LMP 09/04/2018 Comment: possible perimenopausal  SpO2 98%   BMI 28.21  kg/m?   ? ?EXAM: ?General: Pt appears well and is in NAD  ?Neck: General: Supple without adenopathy. ?Thyroid:  No goiter or nodules appreciated.   ?Lungs: Clear with good BS bilat with no rales, rhonchi, or wheezes  ?Heart: Auscultation: RRR.  ?Abdomen: Normoactive bowel sounds, soft, nontender, without masses or organomegaly palpable  ?Extremities:  ?BL LE: No pretibial edema normal ROM and strength.  ?Mental Status: Judgment, insight: Intact ?Memory: Intact for recent and remote events ?Mood and affect: No depression, anxiety, or agitation  ? ? ? ?DATA REVIEWED: ? ? Latest Reference Range & Units 07/14/21 11:15  ?TSH 0.35 - 5.50 uIU/mL 0.18 (L)  ? ? Latest Reference Range & Units 07/14/21 11:15  ?Creatinine,U mg/dL 138.6  ?Microalb, Ur 0.0 - 1.9 mg/dL 2.3 (H)  ?MICROALB/CREAT RATIO 0.0 - 30.0 mg/g 1.7  ? ? ? ?MICROALB/CREAT RATIO 0.0 - 30.0 mg/g 1.7  ? ? ?Thyroid ultrasound 01/23/2021 ? ? ?FINDINGS: ?No residual thyroid tissue is identified in the thyroidectomy bed. ?  ?No enlarged lymph nodes seen in the imaged portions of the neck. ?  ?IMPRESSION: ?No evidence of recurrent or residual thyroid malignancy. ?  ? ? ? ? ?Thyroid Pathology 02/05/2020 ? ?FINAL MICROSCOPIC DIAGNOSIS:  ? ?A. 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 ?ONCOLOGY TABLE:  ? ?THYROID GLAND:  ? ?Procedure: Thyroidectomy  ?Tumor Focality: Unifocal  ?Tumor Site: Left lobe  ?Tumor Size: 1.2 cm  ?Histologic Type: Papillary carcinoma, classic  ?Margins: Uninvolved by tumor  ?Angioinvasion: Not identified  ?Lymphatic Invasion: Not identified  ?Extrathyroidal extension: Not identified   ?Regional Lymph Nodes:  ?     Number of Lymph Nodes Involved: 0  ?     Nodal Levels Involved: 0  ?     Size of Largest Metastatic Deposit: N/A  ?     Extranodal Extension (ENE): N/A  ?     Number of Lymph Nodes

## 2021-07-15 ENCOUNTER — Other Ambulatory Visit (HOSPITAL_COMMUNITY): Payer: Self-pay

## 2021-07-15 ENCOUNTER — Other Ambulatory Visit: Payer: Self-pay | Admitting: Internal Medicine

## 2021-07-15 ENCOUNTER — Telehealth: Payer: Self-pay

## 2021-07-15 MED ORDER — DEXCOM G6 SENSOR MISC: 1.0000 | 3 refills | Status: DC

## 2021-07-15 MED ORDER — DEXCOM G6 TRANSMITTER MISC: 1.0000 | 3 refills | Status: DC

## 2021-07-15 NOTE — Telephone Encounter (Signed)
Dexcom G6 sensors are preferred.  $50 copay (30DS). ?

## 2021-07-16 LAB — THYROGLOBULIN LEVEL: Thyroglobulin: 0.3 ng/mL — ABNORMAL LOW

## 2021-07-16 LAB — THYROGLOBULIN ANTIBODY: Thyroglobulin Ab: <1 IU/mL (ref <=1)

## 2021-07-21 ENCOUNTER — Other Ambulatory Visit (HOSPITAL_COMMUNITY): Payer: Self-pay

## 2021-08-25 ENCOUNTER — Ambulatory Visit (HOSPITAL_BASED_OUTPATIENT_CLINIC_OR_DEPARTMENT_OTHER)
Admission: RE | Admit: 2021-08-25 | Discharge: 2021-08-25 | Disposition: A | Discharge status: Home / Self Care | Payer: Managed Care, Other (non HMO) | Source: Ambulatory Visit | Attending: Obstetrics and Gynecology | Admitting: Obstetrics and Gynecology

## 2021-08-25 ENCOUNTER — Encounter (HOSPITAL_BASED_OUTPATIENT_CLINIC_OR_DEPARTMENT_OTHER): Payer: Self-pay

## 2021-08-25 DIAGNOSIS — Z01419 Encounter for gynecological examination (general) (routine) without abnormal findings: Secondary | ICD-10-CM | POA: Insufficient documentation

## 2021-08-25 DIAGNOSIS — Z1231 Encounter for screening mammogram for malignant neoplasm of breast: Secondary | ICD-10-CM | POA: Insufficient documentation

## 2021-08-28 ENCOUNTER — Other Ambulatory Visit: Payer: Self-pay | Admitting: Internal Medicine

## 2021-09-25 ENCOUNTER — Other Ambulatory Visit: Payer: Self-pay | Admitting: Family Medicine

## 2021-09-25 DIAGNOSIS — G8929 Other chronic pain: Secondary | ICD-10-CM

## 2021-10-12 NOTE — Progress Notes (Signed)
[facility] [place], [place], [place] [address] [phone]  Date:  10/19/2021   Name:  Lauren Mcclure   DOB:  Jul 10, 1970   MRN:  [id number]  PCP:  Darreld Mclean, MD    Chief Complaint: Annual Exam (Concerns/ questions: a1c/Foot exam due/Eye exam:  yes: [facility]- pt can not recall the name of the office/[facility]: none yet)   History of Present Illness:  Lauren Mcclure is a 51 y.o. 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 Bryan Lemma February 2022, all negative biopsies and recommended 5-year follow-up  Most recent lab work on chart from [place] that time her LFTs were trending down Most recent GI visit was in November: 1) NAFLD - Applauded her recent efforts at dieting and exercise/increased activity - Goal 10% body weight loss done slowly over weeks to months through diet/exercise - Repeat liver enzymes in 3-6 months - Recent fib 4 score with low risk for advanced liver disease (score 0.97).  Recommend repeat fib 4 score testing every 2-3 years - Extended serologic work-up otherwise unrevealing for concomitant liver disease - Per patient request, referral to [facility] for assistance in appropriate dieting - If enzymes continue to uptrend on repeat despite appropriate diet/exercise modifications, can perform ultrasound elastography +/- liver biopsy pending results  She is also now seeing endocrinology, Dr. Rosina Lowenstein for her blood sugars and thyroid Most  recent visit in April: Papillary Thyroid Carcinoma, Stage I : - S/P total thyroidectomy on 02/05/2020 - She is at low risk for recurrence - TSH goal 0.5-2.0 uIU/mL if nonstimultated Tg is undetectable.  - TSH is low, but given fluctuation in TSH levels so far, I would not make any changes at this time and we will see how her TSH fares out down the road - Tg, Tg Ab historically have been undetectable , pending on today's labs 2. Postoperative Hypothyroidism : - She is clinically euthyroid  - Pt educated extensively on the correct way to take levothyroxine (first thing in the morning with water, 30 minutes before eating or taking other medications). - Pt encouraged to double dose the following day if she were to miss a dose given long half-life of levothyroxine. Medications  Continue   Levothyroxine 100 mcg,  1 tablet daily 3. Type 2 DM, Optimally Controlled without complications : T2W 5.8% - A1c trended down from 7.9% - Tolerating Metformin  -Praised the patient on improved glycemic control -A prescription of glucose meter has been sent to the pharmacy, she was also educated on proper use of glucose meter -She was asked to check glucose at home 2-3 times weekly -Patient interested in CGM technology, I have prescribed freestyle libre but she understands this may not be covered by her insurance as she is not on insulin.  And she will make a decision whether she wants to pay cash for it or just use a regular meter    Eye exam- she was seen at [facility] about a month ago  Foot exam is due Shingles vaccine- would like to defer  COVID-19 booster Mammogram  is up-to-date Pap smear is up-to-date  Can recheck LFTs, lipid, CBC today Dr. Kelton Pillar is managing A1c and thyroid Urine microalbumin is up-to-date  Amlodipine 10 Losartan 50 Levothyroxine Crestor  Pt notes she lost some weight, but she has gone back to work and is sitting more, this has caused some weight gain again She had lost down  to 166 at her lowest  Wt Readings from Last 3 Encounters:  10/19/21 175 lb 3.2 oz (79.5 kg)  07/14/21 174 lb 12.8 oz (79.3 kg)  03/13/21 174 lb (78.9 kg)     Patient Active Problem List   Diagnosis Date Noted   Type 2 diabetes mellitus without complication, without long-term current use of insulin ([place]) 07/14/2021   Elevated ALT measurement 04/13/2021   Elevated SGOT ([facility]) 04/13/2021   Cervical strain 01/01/2021   Genetic testing 06/13/2020   Family history of pancreatic cancer    Family history of breast cancer    Papillary thyroid carcinoma ([place]) 02/03/2020   Thyroid cyst 02/03/2020   Dyslipidemia 11/28/2019   Controlled diabetes mellitus type 2 with complications ([place]) 85/88/5027   Essential hypertension 05/26/2019    Past Medical History:  Diagnosis Date   Anxiety    Arthritis    Cancer ([place])    thyroid and skin   Elevated blood pressure reading    Family history of breast cancer    Family history of pancreatic cancer    Headache    History of hyperlipidemia    History of kidney stones    Hypertension    Kidney stones    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 03/24/2016   Chest X-Ray done 03/24/16 and was negative    Past Surgical History:  Procedure Laterality Date   LITHOTRIPSY     NASAL SEPTUM SURGERY  2007   SKIN CANCER EXCISION  04/08/2020   Left lower back    THYROIDECTOMY N/A 02/05/2020   Procedure: TOTAL THYROIDECTOMY;  Surgeon: Armandina Gemma, MD;  Location: [facility];  Service: General;  Laterality: N/A;   TONSILLECTOMY      Social History   Tobacco Use   Smoking status: Never   Smokeless tobacco: Never  Vaping Use   Vaping Use: Never used  Substance Use Topics   Alcohol use: Not Currently    Comment: Occ wine or beer   Drug use: No    Family History  Problem Relation Age of Onset   Hypertension Mother    Hyperlipidemia Mother    Pancreatic cancer Father 95       deceased age 18   Cancer - Other Paternal Grandmother    Skin cancer  Paternal Grandfather    Heart disease Paternal Grandfather    Hyperlipidemia Maternal Aunt    Breast cancer Maternal Aunt        dx over 51   Breast cancer Other        dx over 85; MGMs sister   Pancreatic cancer Other        PGMs 3 brothers   Colon cancer Neg Hx    Esophageal cancer Neg Hx     Allergies  Allergen Reactions   Rocephin [Ceftriaxone Sodium In Dextrose] Rash    Medication list has been reviewed and updated.  Current Outpatient Medications on File Prior to Visit  Medication Sig Dispense Refill   amLODipine (NORVASC) 10 MG tablet TAKE 1 TABLET DAILY 90 tablet 3   Ascorbic Acid (VITAMIN C PO) Take 1 tablet by mouth daily as needed.  Blood Glucose Monitoring Suppl (ONETOUCH VERIO) w/Device KIT 1 Device by Does not apply route daily in the afternoon. 1 kit 0   calcium carbonate (TUMS) 500 MG chewable tablet Chew 2 tablets (400 mg of elemental calcium total) by mouth 2 (two) times daily. 90 tablet 1   Calcium Carbonate-Vit D-Min (CALCIUM 1200 PO) Take 1 tablet by mouth as directed. 2 times a week     Continuous Blood Gluc Sensor (DEXCOM G6 SENSOR) MISC 1 Device by Does not apply route as directed. 9 each 3   Continuous Blood Gluc Transmit (DEXCOM G6 TRANSMITTER) MISC 1 Device by Does not apply route as directed. 1 each 3   CRANBERRY PO Take 1 tablet by mouth daily.     glucose blood (ONETOUCH VERIO) test strip 1 each by Other route daily in the afternoon. Use as instructed 100 each 3   levothyroxine (SYNTHROID) 100 MCG tablet TAKE 1 TABLET BY MOUTH DAILY BEFORE BREAKFAST. 90 tablet 1   losartan (COZAAR) 50 MG tablet Take 1 tablet (50 mg total) by mouth daily. 90 tablet 3   metFORMIN (GLUCOPHAGE) 500 MG tablet Take 1 tablet (500 mg total) by mouth 2 (two) times daily with a meal. 180 tablet 3   Multiple Vitamin (MULTIVITAMIN) tablet Take 1 tablet by mouth daily as needed.     omeprazole (PRILOSEC) 20 MG capsule Take 1 capsule (20 mg total) by mouth 2 (two) times daily  before a meal. Use Prilosec 20 mg twice daily for 6 weeks.  If symptoms controlled can decrease to 20 mg daily. 90 capsule 3   rosuvastatin (CRESTOR) 20 MG tablet TAKE 1 TABLET DAILY 90 tablet 1   tizanidine (ZANAFLEX) 2 MG capsule Take 1 capsule (2 mg total) by mouth 3 (three) times daily as needed for muscle spasms. 45 capsule 1   TURMERIC PO Take 1 tablet by mouth daily as needed (inflammation).     [DISCONTINUED] omeprazole (PRILOSEC) 20 MG capsule Take 1 capsule (20 mg total) by mouth 2 (two) times daily before a meal. Use Prilosec 20 mg twice daily for 6 weeks.  If symptoms controlled can decrease to 20 mg daily. 90 capsule 3   No current facility-administered medications on file prior to visit.    Review of Systems:  As per HPI- otherwise negative.   Physical Examination: Vitals:   10/19/21 0958  BP: 120/80  Pulse: 71  Resp: 18  Temp: 98.1 F (36.7 C)  SpO2: 98%   Vitals:   10/19/21 0958  Weight: 175 lb 3.2 oz (79.5 kg)  Height: '5\' 6"'  (1.676 m)   Body mass index is 28.28 kg/m. Ideal Body Weight: Weight in (lb) to have BMI = 25: 154.6  GEN: no acute distress.  Looks well, overweight HEENT: Atraumatic, Normocephalic.  Bilateral TM wnl, oropharynx normal.  PEERL,EOMI.  ' Ears and Nose: No external deformity. CV: RRR, No M/G/R. No JVD. No thrill. No extra heart sounds. PULM: CTA B, no wheezes, crackles, rhonchi. No retractions. No resp. distress. No accessory muscle use. ABD: S, NT, ND. No rebound. No HSM. EXTR: No c/c/e PSYCH: Normally interactive. Conversant.    Assessment and Plan: Physical exam  Controlled type 2 diabetes mellitus without complication, without long-term current use of insulin ([place]) - Plan: Comprehensive metabolic panel, Hemoglobin N8M, OneTouch Delica Lancets 76H MISC  Post-operative hypothyroidism - Plan: TSH  Papillary thyroid carcinoma ([facility])  Essential hypertension - Plan: CBC, Comprehensive metabolic panel  Dyslipidemia - Plan:  Comprehensive metabolic panel  Transaminitis  Chronic pain of both knees - Plan: celecoxib (CELEBREX) 200 MG capsule  Primary insomnia - Plan: zolpidem (AMBIEN) 5 MG tablet  Patient seen today for physical exam.  Encouraged healthy diet and exercise routine Labs are pending as above, will check A1c and thyroid since we are getting blood work Refilled Celebrex for chronic knee pain Refilled Ambien, encouraged her to continue using this sparingly Will plan further follow- up pending labs.   Signed Lamar Blinks, MD  Received labs as below, message to patient  Results for orders placed or performed in visit on 10/19/21  CBC  Result Value Ref Range   WBC 6.3 4.0 - 10.5 K/uL   RBC 5.49 (H) 3.87 - 5.11 Mil/uL   Platelets 277.0 150.0 - 400.0 K/uL   Hemoglobin 14.7 12.0 - 15.0 g/dL   HCT 44.7 36.0 - 46.0 %   MCV 81.5 78.0 - 100.0 fl   MCHC 32.8 30.0 - 36.0 g/dL   RDW 13.8 11.5 - 15.5 %  Comprehensive metabolic panel  Result Value Ref Range   Sodium 140 135 - 145 mEq/L   Potassium 4.0 3.5 - 5.1 mEq/L   Chloride 104 96 - 112 mEq/L   CO2 25 19 - 32 mEq/L   Glucose, Bld 127 (H) 70 - 99 mg/dL   BUN 12 6 - 23 mg/dL   Creatinine, Ser 0.78 0.40 - 1.20 mg/dL   Total Bilirubin 1.5 (H) 0.2 - 1.2 mg/dL   Alkaline Phosphatase 128 (H) 39 - 117 U/L   AST 40 (H) 0 - 37 U/L   ALT 83 (H) 0 - 35 U/L   Total Protein 7.7 6.0 - 8.3 g/dL   Albumin 4.7 3.5 - 5.2 g/dL   GFR 87.30 >60.00 mL/min   Calcium 10.0 8.4 - 10.5 mg/dL  Hemoglobin A1c  Result Value Ref Range   Hgb A1c MFr Bld 6.5 4.6 - 6.5 %  TSH  Result Value Ref Range   TSH 0.15 (L) 0.35 - 5.50 uIU/mL

## 2021-10-12 NOTE — Patient Instructions (Addendum)
It was great to see you again today, I will be in touch with your labs as soon as possible.  We can then plan our next visit, likely 6 to 12 months  I would recommend getting the shingles vaccine at your convenience Ok to use [place] as needed for insomnia- try to use as little as possible as it can be habit forming.  Do NOT use if you will need to drive in less than 8- 10 hours

## 2021-10-19 ENCOUNTER — Encounter: Payer: Self-pay | Admitting: Family Medicine

## 2021-10-19 ENCOUNTER — Ambulatory Visit (INDEPENDENT_AMBULATORY_CARE_PROVIDER_SITE_OTHER): Payer: Managed Care, Other (non HMO) | Admitting: Family Medicine

## 2021-10-19 VITALS — BP 120/80 | HR 71 | Temp 98.1°F | Resp 18 | Ht 66.0 in | Wt 175.2 lb

## 2021-10-19 DIAGNOSIS — C73 Malignant neoplasm of thyroid gland: Secondary | ICD-10-CM | POA: Diagnosis not present

## 2021-10-19 DIAGNOSIS — E119 Type 2 diabetes mellitus without complications: Secondary | ICD-10-CM

## 2021-10-19 DIAGNOSIS — Z0000 Encounter for general adult medical examination without abnormal findings: Secondary | ICD-10-CM | POA: Diagnosis not present

## 2021-10-19 DIAGNOSIS — M25561 Pain in right knee: Secondary | ICD-10-CM

## 2021-10-19 DIAGNOSIS — E890 Postprocedural hypothyroidism: Secondary | ICD-10-CM | POA: Diagnosis not present

## 2021-10-19 DIAGNOSIS — F5101 Primary insomnia: Secondary | ICD-10-CM

## 2021-10-19 DIAGNOSIS — G8929 Other chronic pain: Secondary | ICD-10-CM

## 2021-10-19 DIAGNOSIS — E785 Hyperlipidemia, unspecified: Secondary | ICD-10-CM

## 2021-10-19 DIAGNOSIS — R7401 Elevation of levels of liver transaminase levels: Secondary | ICD-10-CM

## 2021-10-19 DIAGNOSIS — I10 Essential (primary) hypertension: Secondary | ICD-10-CM | POA: Diagnosis not present

## 2021-10-19 DIAGNOSIS — M25562 Pain in left knee: Secondary | ICD-10-CM

## 2021-10-19 LAB — COMPREHENSIVE METABOLIC PANEL
ALT: 83 U/L — ABNORMAL HIGH (ref 0–35)
AST: 40 U/L — ABNORMAL HIGH (ref 0–37)
Albumin: 4.7 g/dL (ref 3.5–5.2)
Alkaline Phosphatase: 128 U/L — ABNORMAL HIGH (ref 39–117)
BUN: 12 mg/dL (ref 6–23)
CO2: 25 mEq/L (ref 19–32)
Calcium: 10 mg/dL (ref 8.4–10.5)
Chloride: 104 mEq/L (ref 96–112)
Creatinine, Ser: 0.78 mg/dL (ref 0.40–1.20)
GFR: 87.3 mL/min (ref >60.00)
Glucose, Bld: 127 mg/dL — ABNORMAL HIGH (ref 70–99)
Potassium: 4 mEq/L (ref 3.5–5.1)
Sodium: 140 mEq/L (ref 135–145)
Total Bilirubin: 1.5 mg/dL — ABNORMAL HIGH (ref 0.2–1.2)
Total Protein: 7.7 g/dL (ref 6.0–8.3)

## 2021-10-19 LAB — TSH: TSH: 0.15 u[IU]/mL — ABNORMAL LOW (ref 0.35–5.50)

## 2021-10-19 LAB — CBC
HCT: 44.7 % (ref 36.0–46.0)
Hemoglobin: 14.7 g/dL (ref 12.0–15.0)
MCHC: 32.8 g/dL (ref 30.0–36.0)
MCV: 81.5 fl (ref 78.0–100.0)
Platelets: 277 10*3/uL (ref 150.0–400.0)
RBC: 5.49 Mil/uL — ABNORMAL HIGH (ref 3.87–5.11)
RDW: 13.8 % (ref 11.5–15.5)
WBC: 6.3 10*3/uL (ref 4.0–10.5)

## 2021-10-19 LAB — HEMOGLOBIN A1C: Hgb A1c MFr Bld: 6.5 % (ref 4.6–6.5)

## 2021-10-19 MED ORDER — ONETOUCH DELICA LANCETS 30G MISC: 99 refills | Status: DC

## 2021-10-19 MED ORDER — ZOLPIDEM TARTRATE 5 MG PO TABS: ORAL_TABLET | ORAL | 2 refills | Status: DC

## 2021-10-19 MED ORDER — CELECOXIB 200 MG PO CAPS: 200.0000 mg | ORAL_CAPSULE | Freq: Two times a day (BID) | ORAL | 0 refills | Status: DC | PRN

## 2021-10-25 ENCOUNTER — Other Ambulatory Visit: Payer: Self-pay | Admitting: Family Medicine

## 2021-11-19 ENCOUNTER — Other Ambulatory Visit: Payer: Self-pay | Admitting: Family Medicine

## 2021-11-19 DIAGNOSIS — E119 Type 2 diabetes mellitus without complications: Secondary | ICD-10-CM

## 2021-11-20 ENCOUNTER — Telehealth: Payer: Self-pay | Admitting: Family Medicine

## 2021-11-20 DIAGNOSIS — I10 Essential (primary) hypertension: Secondary | ICD-10-CM

## 2021-11-20 MED ORDER — AMLODIPINE BESYLATE 10 MG PO TABS: ORAL_TABLET | ORAL | 3 refills | Status: DC

## 2021-11-20 NOTE — Telephone Encounter (Signed)
Pt is needing rx sent to a different pharmacy.   Medication: amLODipine (NORVASC) 10 MG tablet  Has the patient contacted their pharmacy? No.   Preferred Pharmacy:  [facility] [place], [place] - [place], [place] [address]  Phone:  [phone]  Fax:  [phone] Agent: Please be advised that RX refills may take up to 3 business days. We ask that you follow-up with your pharmacy.

## 2021-11-20 NOTE — Telephone Encounter (Signed)
Rx sent 

## 2021-12-22 ENCOUNTER — Other Ambulatory Visit: Payer: Self-pay | Admitting: Family Medicine

## 2021-12-22 DIAGNOSIS — G8929 Other chronic pain: Secondary | ICD-10-CM

## 2022-01-13 ENCOUNTER — Encounter: Payer: Self-pay | Admitting: Internal Medicine

## 2022-01-13 ENCOUNTER — Ambulatory Visit (INDEPENDENT_AMBULATORY_CARE_PROVIDER_SITE_OTHER): Payer: Managed Care, Other (non HMO) | Admitting: Internal Medicine

## 2022-01-13 VITALS — BP 120/70 | HR 76 | Ht 66.0 in | Wt 174.0 lb

## 2022-01-13 DIAGNOSIS — Z23 Encounter for immunization: Secondary | ICD-10-CM

## 2022-01-13 DIAGNOSIS — E119 Type 2 diabetes mellitus without complications: Secondary | ICD-10-CM

## 2022-01-13 DIAGNOSIS — C73 Malignant neoplasm of thyroid gland: Secondary | ICD-10-CM

## 2022-01-13 DIAGNOSIS — E890 Postprocedural hypothyroidism: Secondary | ICD-10-CM | POA: Diagnosis not present

## 2022-01-13 LAB — POCT GLYCOSYLATED HEMOGLOBIN (HGB A1C): Hemoglobin A1C: 6.7 % — AB (ref 4.0–5.6)

## 2022-01-13 LAB — TSH: TSH: 0.25 u[IU]/mL — ABNORMAL LOW (ref 0.35–5.50)

## 2022-01-13 NOTE — Patient Instructions (Signed)

## 2022-01-13 NOTE — Progress Notes (Signed)
Name: Lauren Mcclure  MRN/ DOB: [id number], June 06, 1970    Age/ Sex: 51 y.o., female     PCP: Copland, Gay Filler, MD   Reason for Endocrinology Evaluation: [facility]     Initial Endocrinology Clinic Visit: 01/08/2020    PATIENT IDENTIFIER: Lauren Mcclure is a 51 y.o., female with a past medical history of PTC. She has followed with [facility] since 01/08/2020 for consultative assistance with management of her PTC.   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 The tumor was unifocal , 1.2 cm in diameter on the left. Margins uninvolved . One L.N submitted with no involvement.    Due to hyperthyroid symptoms that was interfering with daily activities, we had reduced her LT-4 replacement dose in 03/2020  No  FH of thyroid disease    DIABETES HISTORY: Lauren Mcclure has been diagnosed with T2DM in 2021, she was managed with lifestyle changes until her A1c increased to 7.9% in 01/2021 and she was started on metformin through her PCP.  SUBJECTIVE:    Today (01/13/2022):  Lauren Mcclure is here for a follow up on PTC and postoperative hypothyroidism.   Weight stable  Denies local neck swelling , she has right posterior neck and shoulder   She denies constipation or diarrhea  No palpitations  Denies hand  tremors but has internal tremor   She also brought her meter that has one reading 124 mg/dL   Levothyroxine 100 mcg , half a tablet on Sundays, 1 tablet rest of the week     HISTORY:  Past Medical History:  Past Medical History:  Diagnosis Date   Anxiety    Arthritis    Cancer ([place])    thyroid and skin   Elevated blood  pressure reading    Family history of breast cancer    Family history of pancreatic cancer    Headache    History of hyperlipidemia    History of kidney stones    Hypertension    Kidney stones    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 She has never used smokeless tobacco. She reports that she does not currently use alcohol. She reports that she does not use drugs. Family History:  Family History  Problem Relation Age of Onset   Hypertension Mother    Hyperlipidemia Mother    Pancreatic cancer Father 78       deceased age 71   Cancer - Other Paternal Grandmother    Skin cancer Paternal Grandfather    Heart disease Paternal Grandfather    Hyperlipidemia Maternal Aunt    Breast cancer Maternal Aunt  dx over 50   Breast cancer Other        dx over 24; MGMs sister   Pancreatic cancer Other        PGMs 3 brothers   Colon cancer Neg Hx    Esophageal cancer Neg Hx      HOME MEDICATIONS: Allergies as of 01/13/2022       Reactions   Rocephin [ceftriaxone Sodium In Dextrose] Rash        Medication List        Accurate as of January 13, 2022  7:18 AM. If you have any questions, ask your nurse or doctor.          amLODipine 10 MG tablet Commonly known as: NORVASC TAKE 1 TABLET DAILY   CALCIUM 1200 PO Take 1 tablet by mouth as directed. 2 times a week   calcium carbonate 500 MG chewable tablet Commonly known as: Tums Chew 2 tablets (400 mg of elemental calcium total) by mouth 2 (two) times daily.   celecoxib  200 MG capsule Commonly known as: CELEBREX Take 1 capsule (200 mg total) by mouth 2 (two) times daily as needed for moderate pain.   CRANBERRY PO Take 1 tablet by mouth daily.   Dexcom G6 Sensor Misc 1 Device by Does not apply route as directed.   Dexcom G6 Transmitter Misc 1 Device by Does not apply route as directed.   levothyroxine 100 MCG tablet Commonly known as: SYNTHROID TAKE 1 TABLET BY MOUTH DAILY BEFORE BREAKFAST.   losartan 50 MG tablet Commonly known as: COZAAR TAKE 1 TABLET(50 MG) BY MOUTH DAILY   metFORMIN 500 MG tablet Commonly known as: GLUCOPHAGE TAKE 1 TABLET BY MOUTH TWICE DAILY   multivitamin tablet Take 1 tablet by mouth daily as needed.   omeprazole 20 MG capsule Commonly known as: PRILOSEC Take 1 capsule (20 mg total) by mouth 2 (two) times daily before a meal. Use Prilosec 20 mg twice daily for 6 weeks.  If symptoms controlled can decrease to 20 mg daily.   OneTouch Delica Lancets 93G Misc Use as needed to check glucose up to twice daiy   OneTouch Verio test strip Generic drug: glucose blood 1 each by Other route daily in the afternoon. Use as instructed   OneTouch Verio w/Device Kit 1 Device by Does not apply route daily in the afternoon.   rosuvastatin 20 MG tablet Commonly known as: CRESTOR TAKE 1 TABLET DAILY   tizanidine 2 MG capsule Commonly known as: ZANAFLEX Take 1 capsule (2 mg total) by mouth 3 (three) times daily as needed for muscle spasms.   TURMERIC PO Take 1 tablet by mouth daily as needed (inflammation).   VITAMIN C PO Take 1 tablet by mouth daily as needed.   zolpidem 5 MG tablet Commonly known as: AMBIEN TAKE 1/2 TO 1 TABLET(2.5 TO 5 MG) BY MOUTH AT BEDTIME AS NEEDED FOR SLEEP          OBJECTIVE:   PHYSICAL EXAM: VS: BP 120/70 (BP Location: Left Arm, Patient Position: Sitting, Cuff Size: Small)   Pulse 76   Ht '5\' 6"'  (1.676 m)   Wt 174 lb (78.9 kg)   LMP 09/04/2018 Comment: possible perimenopausal  SpO2  97%   BMI 28.08 kg/m    EXAM: General: Pt appears well and is in NAD  Neck: General: Supple without adenopathy. Thyroid:  No goiter or nodules appreciated.   Lungs: Clear with good BS bilat with no rales, rhonchi, or wheezes  Heart:  Auscultation: RRR.  Abdomen: Normoactive bowel sounds, soft, nontender, without masses or organomegaly palpable  Extremities:  BL LE: No pretibial edema normal ROM and strength.  Mental Status: Judgment, insight: Intact Memory: Intact for recent and remote events Mood and affect: No depression, anxiety, or agitation     DATA REVIEWED:   Latest Reference Range & Units 01/13/22 10:52  TSH 0.35 - 5.50 uIU/mL 0.25 (L)   Tg- pending  Tg Ab - pending   Thyroid ultrasound 01/23/2021   FINDINGS: No residual thyroid tissue is identified in the thyroidectomy bed.   No enlarged lymph nodes seen in the imaged portions of the neck.   IMPRESSION: No evidence of recurrent or residual thyroid malignancy.       Thyroid Pathology 02/05/2020  FINAL MICROSCOPIC DIAGNOSIS:   A. THYROID, TOTAL, THYROIDECTOMY:  -  Papillary thyroid carcinoma, 1.2 cm  -  Benign lymph node (0/1)  -  Hyperplastic nodule with Hurthle cell changes (0.5 cm)  -  Benign cyst (2.4 cm)  -  See oncology table and comment below   ONCOLOGY TABLE:   THYROID GLAND:   Procedure: Thyroidectomy  Tumor Focality: Unifocal  Tumor Site: Left lobe  Tumor Size: 1.2 cm  Histologic Type: Papillary carcinoma, classic  Margins: Uninvolved by tumor  Angioinvasion: Not identified  Lymphatic Invasion: Not identified  Extrathyroidal extension: Not identified  Regional Lymph Nodes:       Number of Lymph Nodes Involved: 0       Nodal Levels Involved: 0       Size of Largest Metastatic Deposit: N/A       Extranodal Extension (ENE): N/A       Number of Lymph Nodes Examined: 1  Pathologic Stage Classification (pTNM, AJCC 8th Edition): pT1b, pN0   ASSESSMENT / PLAN / RECOMMENDATIONS:    Papillary Thyroid Carcinoma, Stage I :   - S/P total thyroidectomy on 02/05/2020 - She is at low risk for recurrence - TSH goal 0.5-2.0 uIU/mL  - Tg, Tg Ab historically have been undetectable , pending on today's labs    2. Postoperative Hypothyroidism :  - She is clinically euthyroid  - Pt educated extensively on the correct way to take levothyroxine (first thing in the morning with water, 30 minutes before eating or taking other medications). - Pt encouraged to double dose the following day if she were to miss a dose given long half-life of levothyroxine.   Medications  Stop  Levothyroxine 100 mcg Start levothyroxine 88 mcg daily    3. Type 2 DM, Optimally Controlled without complications : S8N 4.6%  - A1c remains at goal with Metformin  - Diabetes care will be deferred to PCP     F/U in 6 months     Signed electronically by: Mack Guise, MD  [facility]  [facility] [address] [place], [place] [address] Phone: [phone] FAX: [phone]      CC: Darreld Mclean, [address] [place] [address] Phone: [phone]  Fax: [phone]   Return to Endocrinology clinic as below: Future Appointments  Date Time Provider [place]  01/13/2022 10:30 AM Alisson Rozell, Melanie Crazier, MD [facility] None  10/25/2022 10:20 AM Copland, Gay Filler, MD [facility]

## 2022-01-14 MED ORDER — LEVOTHYROXINE SODIUM 88 MCG PO TABS: 88.0000 ug | ORAL_TABLET | Freq: Every day | ORAL | 3 refills | Status: DC

## 2022-01-15 ENCOUNTER — Ambulatory Visit (HOSPITAL_BASED_OUTPATIENT_CLINIC_OR_DEPARTMENT_OTHER)
Admission: RE | Admit: 2022-01-15 | Discharge: 2022-01-15 | Disposition: A | Discharge status: Home / Self Care | Payer: Managed Care, Other (non HMO) | Source: Ambulatory Visit | Attending: Internal Medicine | Admitting: Internal Medicine

## 2022-01-15 DIAGNOSIS — C73 Malignant neoplasm of thyroid gland: Secondary | ICD-10-CM | POA: Diagnosis present

## 2022-01-15 LAB — THYROGLOBULIN LEVEL: Thyroglobulin: 0.2 ng/mL — ABNORMAL LOW

## 2022-01-15 LAB — THYROGLOBULIN ANTIBODY: Thyroglobulin Ab: <1 IU/mL (ref <=1)

## 2022-02-04 ENCOUNTER — Telehealth: Payer: Self-pay | Admitting: Family Medicine

## 2022-02-04 NOTE — Telephone Encounter (Signed)
Pt called stating she is having flu like symptoms and she also has diabetes. She is wanting to know what medication she can take in order to help treat this. Please Advise.

## 2022-02-05 NOTE — Telephone Encounter (Signed)
Pt has been experiencing fever, chills, HA, congestion and body aches for 2-3 days. Since we do not have any appointments I advised her to go to an urgent care to be swabbed since she does not sound well.

## 2022-02-05 NOTE — Telephone Encounter (Signed)
Please advise 

## 2022-02-16 ENCOUNTER — Encounter: Payer: Self-pay | Admitting: General Practice

## 2022-03-16 ENCOUNTER — Other Ambulatory Visit (INDEPENDENT_AMBULATORY_CARE_PROVIDER_SITE_OTHER): Payer: Managed Care, Other (non HMO)

## 2022-03-16 DIAGNOSIS — E890 Postprocedural hypothyroidism: Secondary | ICD-10-CM

## 2022-03-16 LAB — TSH: TSH: 0.41 u[IU]/mL (ref 0.35–5.50)

## 2022-04-10 ENCOUNTER — Other Ambulatory Visit: Payer: Self-pay | Admitting: Family Medicine

## 2022-04-10 DIAGNOSIS — G8929 Other chronic pain: Secondary | ICD-10-CM

## 2022-06-29 ENCOUNTER — Telehealth: Payer: Self-pay | Admitting: Gastroenterology

## 2022-06-29 NOTE — Telephone Encounter (Signed)
Received a MyChart message from patient requesting a Diabetic Kidney Evaluation - Urine ACR and EGFR measurement.  Please call patient and advise.  Thank you.

## 2022-06-29 NOTE — Telephone Encounter (Signed)
Patient notified that she should reach out to her PCP to have her kidney function and diabetes monitoring checked.  She verbalized understanding and has her PCP contact information.

## 2022-07-11 ENCOUNTER — Other Ambulatory Visit: Payer: Self-pay | Admitting: Family Medicine

## 2022-07-11 DIAGNOSIS — E119 Type 2 diabetes mellitus without complications: Secondary | ICD-10-CM

## 2022-07-20 ENCOUNTER — Encounter: Payer: Self-pay | Admitting: Internal Medicine

## 2022-07-20 ENCOUNTER — Ambulatory Visit (INDEPENDENT_AMBULATORY_CARE_PROVIDER_SITE_OTHER): Admitting: Internal Medicine

## 2022-07-20 VITALS — BP 114/70 | HR 86 | Ht 66.0 in | Wt 171.0 lb

## 2022-07-20 DIAGNOSIS — C73 Malignant neoplasm of thyroid gland: Secondary | ICD-10-CM | POA: Diagnosis not present

## 2022-07-20 DIAGNOSIS — E890 Postprocedural hypothyroidism: Secondary | ICD-10-CM | POA: Diagnosis not present

## 2022-07-20 LAB — TSH: TSH: 0.39 u[IU]/mL (ref 0.35–5.50)

## 2022-07-20 NOTE — Progress Notes (Unsigned)
Name: Breah Joa  MRN/ DOB: [id number], March 03, 1971    Age/ Sex: 52 y.o., female     PCP: Copland, Gwenlyn Found, MD   Reason for Endocrinology Evaluation: [facility]     Initial Endocrinology Clinic Visit: 01/08/2020    PATIENT IDENTIFIER: Ms. Teffany Blaszczyk is a 52 y.o., female with a past medical history of PTC. She has followed with [facility] clinic since 01/08/2020 for consultative assistance with management of her PTC.   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 The tumor was unifocal , 1.2 cm in diameter on the left. Margins uninvolved . One L.N submitted with no involvement.    Due to hyperthyroid symptoms that was interfering with daily activities, we had reduced her LT-4 replacement dose in 03/2020  No  FH of thyroid disease     SUBJECTIVE:    Today (07/20/2022):  Ms. Schwier is here for a follow up on PTC and postoperative hypothyroidism.   Weight has been trending down  Denies local neck swelling  Denies constipation or diarrhea  Palpitations has been improving  Denies hand  tremors  She has been getting recurrent URI infection   Levothyroxine 88 mcg , daily     HISTORY:  Past Medical History:  Past Medical History:  Diagnosis Date   Anxiety    Arthritis    Cancer ([facility])    thyroid and skin   Elevated blood pressure reading    Family history of breast cancer    Family history of pancreatic cancer    Headache    History of hyperlipidemia    History of kidney stones    Hypertension    Kidney stones    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 03/24/2016   Chest X-Ray done 03/24/16 and was negative   Past  Surgical History:  Past Surgical History:  Procedure Laterality Date   LITHOTRIPSY     NASAL SEPTUM SURGERY  2007   SKIN CANCER EXCISION  04/08/2020   Left lower back    THYROIDECTOMY N/A 02/05/2020   Procedure: TOTAL THYROIDECTOMY;  Surgeon: Darnell Level, MD;  Location: [facility];  Service: General;  Laterality: N/A;   TONSILLECTOMY     Social History:  reports that she has never smoked. She has never used smokeless tobacco. She reports that she does not currently use alcohol. She reports that she does not use drugs. Family History:  Family History  Problem Relation Age of Onset   Hypertension Mother    Hyperlipidemia Mother    Pancreatic cancer Father 65       deceased age 28   Cancer - Other Paternal Grandmother    Skin cancer Paternal Grandfather    Heart disease Paternal Grandfather    Hyperlipidemia Maternal Aunt    Breast cancer Maternal Aunt        dx over 86   Breast cancer Other        dx over 66; MGMs sister   Pancreatic cancer Other        PGMs 3 brothers   Colon cancer Neg Hx    Esophageal cancer Neg Hx      HOME  MEDICATIONS: Allergies as of 07/20/2022       Reactions   Rocephin [ceftriaxone Sodium In Dextrose] Rash        Medication List        Accurate as of July 20, 2022  7:18 AM. If you have any questions, ask your nurse or doctor.          amLODipine 10 MG tablet Commonly known as: NORVASC TAKE 1 TABLET DAILY   CALCIUM 1200 PO Take 1 tablet by mouth as directed. 2 times a week   calcium carbonate 500 MG chewable tablet Commonly known as: Tums Chew 2 tablets (400 mg of elemental calcium total) by mouth 2 (two) times daily.   celecoxib 200 MG capsule Commonly known as: CELEBREX TAKE 1 CAPSULE (200 MG TOTAL) BY MOUTH 2 (TWO) TIMES DAILY AS NEEDED FOR MODERATE PAIN.   CRANBERRY PO Take 1 tablet by mouth daily.   Dexcom G6 Sensor Misc 1 Device by Does not apply route as directed.   Dexcom G6 Transmitter Misc 1 Device by Does not  apply route as directed.   levothyroxine 88 MCG tablet Commonly known as: SYNTHROID Take 1 tablet (88 mcg total) by mouth daily.   losartan 50 MG tablet Commonly known as: COZAAR TAKE 1 TABLET(50 MG) BY MOUTH DAILY   metFORMIN 500 MG tablet Commonly known as: GLUCOPHAGE TAKE 1 TABLET BY MOUTH TWICE A DAY   multivitamin tablet Take 1 tablet by mouth daily as needed.   omeprazole 20 MG capsule Commonly known as: PRILOSEC Take 1 capsule (20 mg total) by mouth 2 (two) times daily before a meal. Use Prilosec 20 mg twice daily for 6 weeks.  If symptoms controlled can decrease to 20 mg daily.   OneTouch Delica Lancets 30G Misc Use as needed to check glucose up to twice daiy   OneTouch Verio test strip Generic drug: glucose blood 1 each by Other route daily in the afternoon. Use as instructed   OneTouch Verio w/Device Kit 1 Device by Does not apply route daily in the afternoon.   rosuvastatin 20 MG tablet Commonly known as: CRESTOR TAKE 1 TABLET DAILY   tizanidine 2 MG capsule Commonly known as: ZANAFLEX Take 1 capsule (2 mg total) by mouth 3 (three) times daily as needed for muscle spasms.   TURMERIC PO Take 1 tablet by mouth daily as needed (inflammation).   VITAMIN C PO Take 1 tablet by mouth daily as needed.   zolpidem 5 MG tablet Commonly known as: AMBIEN TAKE 1/2 TO 1 TABLET(2.5 TO 5 MG) BY MOUTH AT BEDTIME AS NEEDED FOR SLEEP          OBJECTIVE:   PHYSICAL EXAM: VS: LMP 09/04/2018 Comment: possible perimenopausal   EXAM: General: Pt appears well and is in NAD  Neck: General: Supple without adenopathy. Thyroid:  No goiter or nodules appreciated.   Lungs: Clear with good BS bilat   Heart: Auscultation: RRR.  Abdomen: soft, nontender  Extremities:  BL LE: No pretibial edema normal  Mental Status: Judgment, insight: Intact Memory: Intact for recent and remote events Mood and affect: No depression, anxiety, or agitation     DATA  REVIEWED: ****  Thyroid ultrasound 01/15/2022  FINDINGS: The thyroid gland is surgically absent. No evidence of residual or recurrent thyroid tissue. No suspicious lymphadenopathy.   IMPRESSION: Surgical changes of total thyroidectomy without evidence of residual or recurrent thyroid tissue.        Thyroid Pathology 02/05/2020  FINAL MICROSCOPIC DIAGNOSIS:  A. THYROID, TOTAL, THYROIDECTOMY:  -  Papillary thyroid carcinoma, 1.2 cm  -  Benign lymph node (0/1)  -  Hyperplastic nodule with Hurthle cell changes (0.5 cm)  -  Benign cyst (2.4 cm)  -  See oncology table and comment below   ONCOLOGY TABLE:   THYROID GLAND:   Procedure: Thyroidectomy  Tumor Focality: Unifocal  Tumor Site: Left lobe  Tumor Size: 1.2 cm  Histologic Type: Papillary carcinoma, classic  Margins: Uninvolved by tumor  Angioinvasion: Not identified  Lymphatic Invasion: Not identified  Extrathyroidal extension: Not identified  Regional Lymph Nodes:       Number of Lymph Nodes Involved: 0       Nodal Levels Involved: 0       Size of Largest Metastatic Deposit: N/A       Extranodal Extension (ENE): N/A       Number of Lymph Nodes Examined: 1  Pathologic Stage Classification (pTNM, AJCC 8th Edition): pT1b, pN0   ASSESSMENT / PLAN / RECOMMENDATIONS:   Papillary Thyroid Carcinoma, Stage I :   - S/P total thyroidectomy on 02/05/2020 - She is at low risk for recurrence - Pt with excellent biochemical and structural response - TSH goal 0.5-2.0 uIU/mL  - Tg, Tg Ab historically have been undetectable , pending on today's labs    2. Postoperative Hypothyroidism :  - She is clinically euthyroid     Medications    levothyroxine 88 mcg daily     F/U in 6 months     Signed electronically by: Lyndle Herrlich, MD  [facility] End[facility]  [facility] [address] [place], [place] [address] Phone: [phone] FAX: [phone]      CC: Pearline Cables, MD [address] [place] [address] Phone: [phone]  Fax: [phone]   Return to Endocrinology clinic as below: Future Appointments  Date Time Provider Department Center  07/20/2022 10:10 AM Evelisse Szalkowski, Konrad Dolores, MD [facility] None  10/25/2022 10:20 AM Copland, Gwenlyn Found, MD [facility]

## 2022-07-21 LAB — THYROGLOBULIN LEVEL: Thyroglobulin: 0.2 ng/mL — ABNORMAL LOW

## 2022-07-21 LAB — THYROGLOBULIN ANTIBODY: Thyroglobulin Ab: <1 IU/mL (ref <=1)

## 2022-07-21 MED ORDER — LEVOTHYROXINE SODIUM 88 MCG PO TABS: 88.0000 ug | ORAL_TABLET | Freq: Every day | ORAL | 3 refills | Status: DC

## 2022-08-14 ENCOUNTER — Other Ambulatory Visit: Payer: Self-pay | Admitting: Family Medicine

## 2022-08-14 DIAGNOSIS — I10 Essential (primary) hypertension: Secondary | ICD-10-CM

## 2022-10-16 NOTE — Progress Notes (Signed)
[facility] [address], [address] [place], [place] [address] [phone]  Date:  10/25/2022   Name:  Lauren Mcclure   DOB:  10/05/1970   MRN:  [id number]  PCP:  Pearline Cables, MD    Chief Complaint: Annual Exam (Concerns/ questions: BP has been up and down. LDL was high at recent health screen. 3. Pt says she started feeling a strange feeling in the back of her neck that started about 1 week ago. (She started her husbands atorvastatin about 1 week ago)/Foot exam are urine MA are due today/Cologurard due/Shingrix: none/Eye Exam: 2023/)   History of Present Illness:  Lauren Mcclure is a 52 y.o. very pleasant female patient who presents with the following:  Patient seen today for physical exam Most recent visit with myself was about 1 year ago- history of papillary thyroid cancer s/p resection 02/2020, postsurgical hypothyroidism, Squamous Cell Carcinoma on back s/p excision 03/2020, HTN, type II diabetes, HLD, nephrolithiasis, elevated liver enzymes, upper abdominal pain, and previous positive Cologuard status post follow-up colonoscopy   Positive Cologuard, she had a colonoscopy per Dr. Barron Alvine February 2022, all negative biopsies and recommended 5-year follow-up  She was seen by Dr. Lonzo Cloud most recently in February: Papillary Thyroid Carcinoma, Stage I : - S/P total thyroidectomy on 02/05/2020 - She is at low risk for recurrence - Pt with excellent biochemical and structural response - TSH goal 0.5-2.0 uIU/mL  - Tg, Tg Ab historically have been undetectable , pending on today's labs 2. Postoperative Hypothyroidism : - She is clinically euthyroid  -TSH within normal range -No changes  Lab Results  Component Value Date   HGBA1C 6.7 (A) 01/13/2022   Amlodipine 10 Dexcom CBG Levothyroxine 88 Losartan 50 Metformin 500 twice day Crestor 20- pt notes she stopped using this and needs a new rx. She [facility] some [facility]  [facility] and was concerned that her total cholesterol was over 300 Blood pressure was also elevated; patient notes she has been using amlodipine but not losartan Ambien as needed   Mammogram due for update Colonoscopy 2022 Pap smear 2022, negative Shingrix- delcines today  Foot exam due Patient Active Problem List   Diagnosis Date Noted   Type 2 diabetes mellitus without complication, without long-term current use of insulin (HCC) 07/14/2021   Elevated ALT measurement 04/13/2021   Elevated SGOT (AST) 04/13/2021   Cervical strain 01/01/2021   Genetic testing 06/13/2020   Family history of pancreatic cancer    Family history of breast cancer    Papillary thyroid carcinoma (HCC) 02/03/2020   Thyroid cyst 02/03/2020   Dyslipidemia 11/28/2019   Controlled diabetes mellitus type 2 with complications (HCC) 11/28/2019   Essential hypertension 05/26/2019    Past Medical History:  Diagnosis Date   Anxiety    Arthritis    Cancer (HCC)    thyroid and skin   Elevated blood pressure reading    Family history of breast cancer    Family history of pancreatic cancer    Headache    History of hyperlipidemia    History of kidney stones    Hypertension    Kidney stones    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 03/24/2016   Chest X-Ray done 03/24/16 and was negative    Past Surgical History:  Procedure Laterality Date   LITHOTRIPSY     NASAL SEPTUM SURGERY  2007   SKIN CANCER EXCISION  04/08/2020  Left lower back    THYROIDECTOMY N/A 02/05/2020   Procedure: TOTAL THYROIDECTOMY;  Surgeon: Darnell Level, MD;  Location: [facility];  Service: General;  Laterality: N/A;   TONSILLECTOMY      Social History   Tobacco Use   Smoking status: Never   Smokeless tobacco: Never  Vaping Use   Vaping status: Never Used  Substance Use Topics   Alcohol use: Not Currently    Comment: Occ wine or beer   Drug use: No    Family History  Problem Relation Age of Onset    Hypertension Mother    Hyperlipidemia Mother    Pancreatic cancer Father 25       deceased age 22   Cancer - Other Paternal Grandmother    Skin cancer Paternal Grandfather    Heart disease Paternal Grandfather    Hyperlipidemia Maternal Aunt    Breast cancer Maternal Aunt        dx over 54   Breast cancer Other        dx over 85; MGMs sister   Pancreatic cancer Other        PGMs 3 brothers   Colon cancer Neg Hx    Esophageal cancer Neg Hx     Allergies  Allergen Reactions   Rocephin [Ceftriaxone Sodium In Dextrose] Rash    Medication list has been reviewed and updated.  Current Outpatient Medications on File Prior to Visit  Medication Sig Dispense Refill   amLODipine (NORVASC) 10 MG tablet TAKE 1 TABLET BY MOUTH EVERY DAY 90 tablet 3   Ascorbic Acid (VITAMIN C PO) Take 1 tablet by mouth daily as needed.     Blood Glucose Monitoring Suppl (ONETOUCH VERIO) w/Device KIT 1 Device by Does not apply route daily in the afternoon. 1 kit 0   calcium carbonate (TUMS) 500 MG chewable tablet Chew 2 tablets (400 mg of elemental calcium total) by mouth 2 (two) times daily. 90 tablet 1   Calcium Carbonate-Vit D-Min (CALCIUM 1200 PO) Take 1 tablet by mouth as directed. 2 times a week     celecoxib (CELEBREX) 200 MG capsule TAKE 1 CAPSULE (200 MG TOTAL) BY MOUTH 2 (TWO) TIMES DAILY AS NEEDED FOR MODERATE PAIN. 180 capsule 0   Continuous Blood Gluc Sensor (DEXCOM G6 SENSOR) MISC 1 Device by Does not apply route as directed. 9 each 3   Continuous Blood Gluc Transmit (DEXCOM G6 TRANSMITTER) MISC 1 Device by Does not apply route as directed. 1 each 3   CRANBERRY PO Take 1 tablet by mouth daily.     glucose blood (ONETOUCH VERIO) test strip 1 each by Other route daily in the afternoon. Use as instructed 100 each 3   levothyroxine (SYNTHROID) 88 MCG tablet Take 1 tablet (88 mcg total) by mouth daily. 90 tablet 3   metFORMIN (GLUCOPHAGE) 500 MG tablet TAKE 1 TABLET BY MOUTH TWICE A DAY 180 tablet 1    Multiple Vitamin (MULTIVITAMIN) tablet Take 1 tablet by mouth daily as needed.     omeprazole (PRILOSEC) 20 MG capsule Take 1 capsule (20 mg total) by mouth 2 (two) times daily before a meal. Use Prilosec 20 mg twice daily for 6 weeks.  If symptoms controlled can decrease to 20 mg daily. 90 capsule 3   OneTouch Delica Lancets 30G MISC Use as needed to check glucose up to twice daiy 100 each PRN   tizanidine (ZANAFLEX) 2 MG capsule Take 1 capsule (2 mg total) by mouth 3 (three) times daily  as needed for muscle spasms. 45 capsule 1   TURMERIC PO Take 1 tablet by mouth daily as needed (inflammation).     zolpidem (AMBIEN) 5 MG tablet TAKE 1/2 TO 1 TABLET(2.5 TO 5 MG) BY MOUTH AT BEDTIME AS NEEDED FOR SLEEP 15 tablet 2   [DISCONTINUED] omeprazole (PRILOSEC) 20 MG capsule Take 1 capsule (20 mg total) by mouth 2 (two) times daily before a meal. Use Prilosec 20 mg twice daily for 6 weeks.  If symptoms controlled can decrease to 20 mg daily. 90 capsule 3   No current facility-administered medications on file prior to visit.    Review of Systems:  As per HPI- otherwise negative.   Physical Examination: Vitals:   10/25/22 1012  BP: (!) 132/90  Pulse: 75  Resp: 18  Temp: 97.8 F (36.6 C)  SpO2: 98%   Vitals:   10/25/22 1012  Weight: 179 lb (81.2 kg)  Height: 5\' 6"  (1.676 m)   Body mass index is 28.89 kg/m. Ideal Body Weight: Weight in (lb) to have BMI = 25: 154.6  GEN: no acute distress. HEENT: Atraumatic, Normocephalic.  Ears and Nose: No external deformity. CV: RRR, No M/G/R. No JVD. No thrill. No extra heart sounds. PULM: CTA B, no wheezes, crackles, rhonchi. No retractions. No resp. distress. No accessory muscle use. ABD: S, NT, ND, +BS. No rebound. No HSM. EXTR: No c/c/e PSYCH: Normally interactive. Conversant.  Foot exam- done today   Assessment and Plan: Physical exam  Essential hypertension - Plan: CBC, Comprehensive metabolic panel, CT CARDIAC SCORING (SELF PAY ONLY),  losartan (COZAAR) 50 MG tablet  Papillary thyroid carcinoma (HCC) - Plan: TSH  Controlled type 2 diabetes mellitus without complication, without long-term current use of insulin (HCC) - Plan: Hemoglobin A1c, CT CARDIAC SCORING (SELF PAY ONLY)  Post-operative hypothyroidism  Dyslipidemia - Plan: Lipid panel, CT CARDIAC SCORING (SELF PAY ONLY)  Fatigue, unspecified type - Plan: VITAMIN D 25 Hydroxy (Vit-D Deficiency, Fractures)  Hyperlipidemia, unspecified hyperlipidemia type - Plan: rosuvastatin (CRESTOR) 20 MG tablet  Encounter for screening mammogram for malignant neoplasm of breast - Plan: MM 3D SCREENING MAMMOGRAM BILATERAL BREAST  Physical exam today.  Encouraged healthy diet and exercise routine Will plan further follow- up pending labs. Blood pressure has been elevated, but it sounds like she has not been taking losartan.  Will have her start back on losartan, continue amlodipine  Follow-up on TSH Encouraged her to continue Crestor, ordered CT coronary calcium  Ordered mammogram  Will plan further follow- up pending labs.  Signed Abbe Amsterdam, MD  Received labs as below, message to patient Results for orders placed or performed in visit on 10/25/22  CBC  Result Value Ref Range   WBC 7.8 4.0 - 10.5 K/uL   RBC 5.43 (H) 3.87 - 5.11 Mil/uL   Platelets 278.0 150.0 - 400.0 K/uL   Hemoglobin 14.8 12.0 - 15.0 g/dL   HCT 78.2 95.6 - 21.3 %   MCV 82.6 78.0 - 100.0 fl   MCHC 33.0 30.0 - 36.0 g/dL   RDW 08.6 57.8 - 46.9 %  Comprehensive metabolic panel  Result Value Ref Range   Sodium 141 135 - 145 mEq/L   Potassium 3.9 3.5 - 5.1 mEq/L   Chloride 103 96 - 112 mEq/L   CO2 26 19 - 32 mEq/L   Glucose, Bld 148 (H) 70 - 99 mg/dL   BUN 14 6 - 23 mg/dL   Creatinine, Ser 6.29 0.40 - 1.20 mg/dL   Total  Bilirubin 2.0 (H) 0.2 - 1.2 mg/dL   Alkaline Phosphatase 115 39 - 117 U/L   AST 47 (H) 0 - 37 U/L   ALT 92 (H) 0 - 35 U/L   Total Protein 7.6 6.0 - 8.3 g/dL   Albumin 4.7  3.5 - 5.2 g/dL   GFR 16.10 >96.04 mL/min   Calcium 9.8 8.4 - 10.5 mg/dL  Hemoglobin V4U  Result Value Ref Range   Hgb A1c MFr Bld 7.1 (H) 4.6 - 6.5 %  Lipid panel  Result Value Ref Range   Cholesterol 176 0 - 200 mg/dL   Triglycerides 981.1 (H) 0.0 - 149.0 mg/dL   HDL 91.47 >82.95 mg/dL   VLDL 62.1 (H) 0.0 - 30.8 mg/dL   Total CHOL/HDL Ratio 4    NonHDL 129.20   VITAMIN D 25 Hydroxy (Vit-D Deficiency, Fractures)  Result Value Ref Range   VITD 16.09 (L) 30.00 - 100.00 ng/mL  TSH  Result Value Ref Range   TSH 0.82 0.35 - 5.50 uIU/mL  LDL cholesterol, direct  Result Value Ref Range   Direct LDL 94.0 mg/dL

## 2022-10-16 NOTE — Patient Instructions (Addendum)
It was great to see you again today, I will be in touch with your labs soon as possible Please stop by imaging on the ground floor to set up your mammogram and heart artery (coronary calcium) test   Please start back on crestor for your cholesterol and losartan for your BP which is a bit high I recommend getting the Shingles vaccine series at your convenience  Assuming all is well please see me in about 6 months

## 2022-10-25 ENCOUNTER — Ambulatory Visit: Admitting: Family Medicine

## 2022-10-25 ENCOUNTER — Encounter: Payer: Self-pay | Admitting: Family Medicine

## 2022-10-25 VITALS — BP 132/90 | HR 75 | Temp 97.8°F | Resp 18 | Ht 66.0 in | Wt 179.0 lb

## 2022-10-25 DIAGNOSIS — Z0000 Encounter for general adult medical examination without abnormal findings: Secondary | ICD-10-CM | POA: Diagnosis not present

## 2022-10-25 DIAGNOSIS — R5383 Other fatigue: Secondary | ICD-10-CM

## 2022-10-25 DIAGNOSIS — E890 Postprocedural hypothyroidism: Secondary | ICD-10-CM

## 2022-10-25 DIAGNOSIS — E119 Type 2 diabetes mellitus without complications: Secondary | ICD-10-CM

## 2022-10-25 DIAGNOSIS — C73 Malignant neoplasm of thyroid gland: Secondary | ICD-10-CM

## 2022-10-25 DIAGNOSIS — I10 Essential (primary) hypertension: Secondary | ICD-10-CM

## 2022-10-25 DIAGNOSIS — Z1231 Encounter for screening mammogram for malignant neoplasm of breast: Secondary | ICD-10-CM

## 2022-10-25 DIAGNOSIS — E785 Hyperlipidemia, unspecified: Secondary | ICD-10-CM

## 2022-10-25 LAB — CBC
HCT: 44.9 % (ref 36.0–46.0)
Hemoglobin: 14.8 g/dL (ref 12.0–15.0)
MCHC: 33 g/dL (ref 30.0–36.0)
MCV: 82.6 fl (ref 78.0–100.0)
Platelets: 278 10*3/uL (ref 150.0–400.0)
RBC: 5.43 Mil/uL — ABNORMAL HIGH (ref 3.87–5.11)
RDW: 13.9 % (ref 11.5–15.5)
WBC: 7.8 10*3/uL (ref 4.0–10.5)

## 2022-10-25 LAB — COMPREHENSIVE METABOLIC PANEL
ALT: 92 U/L — ABNORMAL HIGH (ref 0–35)
AST: 47 U/L — ABNORMAL HIGH (ref 0–37)
Albumin: 4.7 g/dL (ref 3.5–5.2)
Alkaline Phosphatase: 115 U/L (ref 39–117)
BUN: 14 mg/dL (ref 6–23)
CO2: 26 mEq/L (ref 19–32)
Calcium: 9.8 mg/dL (ref 8.4–10.5)
Chloride: 103 mEq/L (ref 96–112)
Creatinine, Ser: 0.92 mg/dL (ref 0.40–1.20)
GFR: 71.1 mL/min (ref >60.00)
Glucose, Bld: 148 mg/dL — ABNORMAL HIGH (ref 70–99)
Potassium: 3.9 mEq/L (ref 3.5–5.1)
Sodium: 141 mEq/L (ref 135–145)
Total Bilirubin: 2 mg/dL — ABNORMAL HIGH (ref 0.2–1.2)
Total Protein: 7.6 g/dL (ref 6.0–8.3)

## 2022-10-25 LAB — HEMOGLOBIN A1C: Hgb A1c MFr Bld: 7.1 % — ABNORMAL HIGH (ref 4.6–6.5)

## 2022-10-25 LAB — LIPID PANEL
Cholesterol: 176 mg/dL (ref 0–200)
HDL: 47.2 mg/dL (ref >39.00)
NonHDL: 129.2
Total CHOL/HDL Ratio: 4
Triglycerides: 298 mg/dL — ABNORMAL HIGH (ref 0.0–149.0)
VLDL: 59.6 mg/dL — ABNORMAL HIGH (ref 0.0–40.0)

## 2022-10-25 LAB — LDL CHOLESTEROL, DIRECT: Direct LDL: 94 mg/dL

## 2022-10-25 LAB — TSH: TSH: 0.82 u[IU]/mL (ref 0.35–5.50)

## 2022-10-25 LAB — VITAMIN D 25 HYDROXY (VIT D DEFICIENCY, FRACTURES): VITD: 16.09 ng/mL — ABNORMAL LOW (ref 30.00–100.00)

## 2022-10-25 MED ORDER — ROSUVASTATIN CALCIUM 20 MG PO TABS
20.0000 mg | ORAL_TABLET | Freq: Every day | ORAL | 3 refills | Status: DC
Start: 2022-10-25 — End: 2023-02-24

## 2022-10-25 MED ORDER — LOSARTAN POTASSIUM 50 MG PO TABS
50.0000 mg | ORAL_TABLET | Freq: Every day | ORAL | 3 refills | Status: DC
Start: 2022-10-25 — End: 2023-02-24

## 2022-10-27 ENCOUNTER — Encounter: Payer: Self-pay | Admitting: Family Medicine

## 2022-10-27 ENCOUNTER — Other Ambulatory Visit: Payer: Self-pay | Admitting: Family Medicine

## 2022-10-27 DIAGNOSIS — R7401 Elevation of levels of liver transaminase levels: Secondary | ICD-10-CM

## 2022-11-01 ENCOUNTER — Telehealth: Payer: Self-pay

## 2022-11-01 ENCOUNTER — Ambulatory Visit: Admitting: Physician Assistant

## 2022-11-01 ENCOUNTER — Other Ambulatory Visit (HOSPITAL_BASED_OUTPATIENT_CLINIC_OR_DEPARTMENT_OTHER): Payer: Self-pay

## 2022-11-01 ENCOUNTER — Ambulatory Visit (HOSPITAL_BASED_OUTPATIENT_CLINIC_OR_DEPARTMENT_OTHER)
Admission: RE | Admit: 2022-11-01 | Discharge: 2022-11-01 | Disposition: A | Discharge status: Home / Self Care | Source: Ambulatory Visit | Attending: Physician Assistant | Admitting: Physician Assistant

## 2022-11-01 ENCOUNTER — Encounter: Payer: Self-pay | Admitting: Physician Assistant

## 2022-11-01 VITALS — BP 126/88 | HR 93 | Temp 98.7°F | Resp 20 | Ht 66.0 in | Wt 181.0 lb

## 2022-11-01 DIAGNOSIS — R600 Localized edema: Secondary | ICD-10-CM | POA: Insufficient documentation

## 2022-11-01 MED ORDER — DOXYCYCLINE HYCLATE 100 MG PO TABS
100.0000 mg | ORAL_TABLET | Freq: Two times a day (BID) | ORAL | 0 refills | Status: AC
Start: 2022-11-01 — End: 2022-11-08
  Filled 2022-11-01: qty 14, 7d supply, fill #0

## 2022-11-01 MED ORDER — COLCHICINE 0.6 MG PO TABS
ORAL_TABLET | ORAL | 0 refills | Status: DC
Start: 2022-11-01 — End: 2023-03-01
  Filled 2022-11-01: qty 15, 5d supply, fill #0

## 2022-11-01 NOTE — Telephone Encounter (Signed)
Spoke with patient and informed her that her [place] was scheduled for 6 pm

## 2022-11-01 NOTE — Progress Notes (Signed)
Established patient visit   Patient: Lauren Mcclure   DOB: 10/31/1970   52 y.o. Female  MRN: [id number] Visit Date: 11/01/2022  Today's healthcare provider: Alfredia Ferguson, PA-C   Chief Complaint  Patient presents with   Joint Swelling    Right Ankle swelling with pain  that radiates up to knee no injury started Friday morning. Patient took today Celebrex 200 mg with no relief   Subjective    HPI   Pt reports Friday, x 4 days ago experiencing pain in her right ankle associated with redness and swelling. Since then the pain and swelling has worsened, swelling extends up to her knee. Denies recent travel. Reports her mother had gout, no personal history. She took a celebrex today with limited relief. Denies trauma, puncture wound.  Medications: Outpatient Medications Prior to Visit  Medication Sig   amLODipine (NORVASC) 10 MG tablet TAKE 1 TABLET BY MOUTH EVERY DAY   Ascorbic Acid (VITAMIN C PO) Take 1 tablet by mouth daily as needed.   Blood Glucose Monitoring Suppl (ONETOUCH VERIO) w/Device KIT 1 Device by Does not apply route daily in the afternoon.   calcium carbonate (TUMS) 500 MG chewable tablet Chew 2 tablets (400 mg of elemental calcium total) by mouth 2 (two) times daily.   Calcium Carbonate-Vit D-Min (CALCIUM 1200 PO) Take 1 tablet by mouth as directed. 2 times a week   celecoxib (CELEBREX) 200 MG capsule TAKE 1 CAPSULE (200 MG TOTAL) BY MOUTH 2 (TWO) TIMES DAILY AS NEEDED FOR MODERATE PAIN.   Continuous Blood Gluc Sensor (DEXCOM G6 SENSOR) MISC 1 Device by Does not apply route as directed.   Continuous Blood Gluc Transmit (DEXCOM G6 TRANSMITTER) MISC 1 Device by Does not apply route as directed.   CRANBERRY PO Take 1 tablet by mouth daily.   glucose blood (ONETOUCH VERIO) test strip 1 each by Other route daily in the afternoon. Use as instructed   levothyroxine (SYNTHROID) 88 MCG tablet Take 1 tablet (88 mcg total) by mouth daily.   losartan (COZAAR) 50 MG tablet  Take 1 tablet (50 mg total) by mouth daily.   metFORMIN (GLUCOPHAGE) 500 MG tablet TAKE 1 TABLET BY MOUTH TWICE A DAY   Multiple Vitamin (MULTIVITAMIN) tablet Take 1 tablet by mouth daily as needed.   omeprazole (PRILOSEC) 20 MG capsule Take 1 capsule (20 mg total) by mouth 2 (two) times daily before a meal. Use Prilosec 20 mg twice daily for 6 weeks.  If symptoms controlled can decrease to 20 mg daily.   OneTouch Delica Lancets 30G MISC Use as needed to check glucose up to twice daiy   rosuvastatin (CRESTOR) 20 MG tablet Take 1 tablet (20 mg total) by mouth daily.   tizanidine (ZANAFLEX) 2 MG capsule Take 1 capsule (2 mg total) by mouth 3 (three) times daily as needed for muscle spasms.   TURMERIC PO Take 1 tablet by mouth daily as needed (inflammation).   zolpidem (AMBIEN) 5 MG tablet TAKE 1/2 TO 1 TABLET(2.5 TO 5 MG) BY MOUTH AT BEDTIME AS NEEDED FOR SLEEP   No facility-administered medications prior to visit.    Review of Systems  Constitutional:  Negative for fatigue and fever.  Respiratory:  Negative for cough and shortness of breath.   Cardiovascular:  Negative for chest pain and leg swelling.  Gastrointestinal:  Negative for abdominal pain.  Musculoskeletal:  Positive for arthralgias, joint swelling and myalgias.  Neurological:  Negative for dizziness and headaches.  Objective    BP 126/88 (BP Location: Left Arm, Patient Position: Sitting, Cuff Size: Normal)   Pulse 93   Temp 98.7 F (37.1 C) (Oral)   Resp 20   Ht 5\' 6"  (1.676 m)   Wt 181 lb (82.1 kg)   LMP 09/04/2018 Comment: possible perimenopausal  SpO2 97%   BMI 29.21 kg/m    Physical Exam Vitals reviewed.  Constitutional:      Appearance: She is not ill-appearing.  HENT:     Head: Normocephalic.  Eyes:     Conjunctiva/sclera: Conjunctivae normal.  Cardiovascular:     Rate and Rhythm: Normal rate.  Pulmonary:     Effort: Pulmonary effort is normal. No respiratory distress.  Musculoskeletal:      Comments: Edema to the RLE with erythema most prevalent along the ankle Tenderness to R ankle.  Warmth to the entire lower extremity. Inspected foot, no puncture wounds  Neurological:     General: No focal deficit present.     Mental Status: She is alert and oriented to person, place, and time.  Psychiatric:        Mood and Affect: Mood normal.        Behavior: Behavior normal.      No results found for any visits on 11/01/22.  Assessment & Plan     1. Edema of right lower extremity Sus for cellulitis, gout, or dvt.  Ordered stat US to r/o dvt   Will check uric acid, rx treatment for gout, colchine; advised pt to wait until ~ 7 pm as she took a celebrex today.   Rx doxycycline to treat potential cellulitis, bid x 7 days  - Uric acid - colchicine 0.6 MG tablet; Take 1.2 mg (2 tablets) and then in 1 hour 0.6 mg ( 1 tablet). Starting tomorrow, take 0.6 mg ( 1 tablet) twice daily for 3-5 days, until symptoms resolve.  Dispense: 15 tablet; Refill: 0 - US Venous Img Lower Unilateral Right (DVT) - doxycycline (VIBRA-TABS) 100 MG tablet; Take 1 tablet (100 mg total) by mouth 2 (two) times daily for 7 days.  Dispense: 14 tablet; Refill: 0   Return if symptoms worsen or fail to improve.      I, Alfredia Ferguson, PA-C have reviewed all documentation for this visit. The documentation on  11/01/22   for the exam, diagnosis, procedures, and orders are all accurate and complete.    Alfredia Ferguson, PA-C  [facility] [phone] (phone) [phone] (fax)  [facility]

## 2022-11-01 NOTE — Telephone Encounter (Signed)
Called pt and schedule with Alfredia Ferguson this afternoon at 3:20- appreciate her help with this patient

## 2022-11-02 ENCOUNTER — Other Ambulatory Visit (HOSPITAL_COMMUNITY)

## 2022-11-02 ENCOUNTER — Ambulatory Visit (HOSPITAL_BASED_OUTPATIENT_CLINIC_OR_DEPARTMENT_OTHER): Admission: RE | Admit: 2022-11-02 | Source: Ambulatory Visit

## 2022-11-02 ENCOUNTER — Ambulatory Visit (HOSPITAL_BASED_OUTPATIENT_CLINIC_OR_DEPARTMENT_OTHER)
Admission: RE | Admit: 2022-11-02 | Discharge: 2022-11-02 | Disposition: A | Discharge status: Home / Self Care | Source: Ambulatory Visit | Attending: Family Medicine | Admitting: Family Medicine

## 2022-11-02 ENCOUNTER — Encounter (HOSPITAL_BASED_OUTPATIENT_CLINIC_OR_DEPARTMENT_OTHER): Payer: Self-pay

## 2022-11-02 ENCOUNTER — Encounter: Payer: Self-pay | Admitting: Family Medicine

## 2022-11-02 DIAGNOSIS — Z1231 Encounter for screening mammogram for malignant neoplasm of breast: Secondary | ICD-10-CM | POA: Insufficient documentation

## 2022-11-02 DIAGNOSIS — E785 Hyperlipidemia, unspecified: Secondary | ICD-10-CM | POA: Insufficient documentation

## 2022-11-02 DIAGNOSIS — R600 Localized edema: Secondary | ICD-10-CM

## 2022-11-02 DIAGNOSIS — R7401 Elevation of levels of liver transaminase levels: Secondary | ICD-10-CM

## 2022-11-02 DIAGNOSIS — E119 Type 2 diabetes mellitus without complications: Secondary | ICD-10-CM | POA: Insufficient documentation

## 2022-11-02 DIAGNOSIS — I10 Essential (primary) hypertension: Secondary | ICD-10-CM | POA: Insufficient documentation

## 2022-11-03 ENCOUNTER — Encounter: Payer: Self-pay | Admitting: Family Medicine

## 2022-11-03 MED ORDER — HYDROCODONE-ACETAMINOPHEN 5-325 MG PO TABS
1.0000 | ORAL_TABLET | Freq: Three times a day (TID) | ORAL | 0 refills | Status: AC | PRN
Start: 2022-11-03 — End: 2022-11-08
  Filled 2022-11-03: qty 15, 5d supply, fill #0

## 2022-11-03 NOTE — Addendum Note (Signed)
Addended by: Abbe Amsterdam C on: 11/03/2022 10:26 PM   Modules accepted: Orders

## 2022-11-04 ENCOUNTER — Other Ambulatory Visit (HOSPITAL_BASED_OUTPATIENT_CLINIC_OR_DEPARTMENT_OTHER): Payer: Self-pay

## 2022-11-10 ENCOUNTER — Encounter: Payer: Self-pay | Admitting: Family Medicine

## 2023-01-09 ENCOUNTER — Other Ambulatory Visit: Payer: Self-pay | Admitting: Family Medicine

## 2023-01-09 ENCOUNTER — Other Ambulatory Visit: Payer: Self-pay | Admitting: Internal Medicine

## 2023-01-09 DIAGNOSIS — E119 Type 2 diabetes mellitus without complications: Secondary | ICD-10-CM

## 2023-01-11 ENCOUNTER — Ambulatory Visit: Admitting: Internal Medicine

## 2023-01-11 ENCOUNTER — Encounter: Payer: Self-pay | Admitting: Internal Medicine

## 2023-01-11 VITALS — BP 112/70 | HR 81 | Ht 66.0 in | Wt 180.0 lb

## 2023-01-11 DIAGNOSIS — C73 Malignant neoplasm of thyroid gland: Secondary | ICD-10-CM | POA: Diagnosis not present

## 2023-01-11 DIAGNOSIS — E890 Postprocedural hypothyroidism: Secondary | ICD-10-CM | POA: Diagnosis not present

## 2023-01-11 DIAGNOSIS — Z7984 Long term (current) use of oral hypoglycemic drugs: Secondary | ICD-10-CM

## 2023-01-11 DIAGNOSIS — E119 Type 2 diabetes mellitus without complications: Secondary | ICD-10-CM

## 2023-01-11 LAB — POCT GLYCOSYLATED HEMOGLOBIN (HGB A1C): Hemoglobin A1C: 7.5 % — AB (ref 4.0–5.6)

## 2023-01-11 LAB — TSH: TSH: 1.72 u[IU]/mL (ref 0.35–5.50)

## 2023-01-11 LAB — POCT GLUCOSE (DEVICE FOR HOME USE): Glucose Fasting, POC: 171 mg/dL — AB (ref 70–99)

## 2023-01-11 MED ORDER — METFORMIN HCL ER 500 MG PO TB24: 1000.0000 mg | ORAL_TABLET | Freq: Two times a day (BID) | ORAL | 3 refills | Status: DC

## 2023-01-11 NOTE — Progress Notes (Unsigned)
Name: Lauren Mcclure  MRN/ DOB: [id number], [birth year]    Age/ Sex: 52 y.o., female     PCP: Copland, Lauren Found, MD   Reason for Endocrinology Evaluation: [facility]     Initial Endocrinology Clinic Visit: 01/08/2020    PATIENT IDENTIFIER: Lauren Mcclure is a 52 y.o., female with a past medical history of PTC. She has followed with [facility] since 01/08/2020 for consultative assistance with management of her PTC.   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 The tumor was unifocal , 1.2 cm in diameter on the left. Margins uninvolved . One L.N submitted with no involvement.    Due to hyperthyroid symptoms that was interfering with daily activities, we had reduced her LT-4 replacement dose in 03/2020  No  FH of thyroid disease     SUBJECTIVE:    Today (01/11/2023):  [name] is here for a follow up on PTC and postoperative hypothyroidism.   She checks glucose occasionally  She has noted right upper neck tenderness Denies constipation or diarrhea  Denies palpitations  Has minimal hand  tremors   Levothyroxine 88 mcg , daily     HISTORY:  Past Medical History:  Past Medical History:  Diagnosis Date   Anxiety    Arthritis    Cancer (HCC)    thyroid and skin   Elevated blood pressure reading    Family history of breast cancer    Family history of pancreatic cancer    Headache    History of hyperlipidemia    History of kidney stones    Hypertension    Kidney stones    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 03/24/2016   Chest X-Ray done 03/24/16 and was negative   Past Surgical History:  Past Surgical History:   Procedure Laterality Date   LITHOTRIPSY     NASAL SEPTUM SURGERY  2007   SKIN CANCER EXCISION  04/08/2020   Left lower back    THYROIDECTOMY N/A 02/05/2020   Procedure: TOTAL THYROIDECTOMY;  Surgeon: Darnell Level, MD;  Location: [facility];  Service: General;  Laterality: N/A;   TONSILLECTOMY     Social History:  reports that she has never smoked. She has never used smokeless tobacco. She reports that she does not currently use alcohol. She reports that she does not use drugs. Family History:  Family History  Problem Relation Age of Onset   Hypertension Mother    Hyperlipidemia Mother    Pancreatic cancer Father 67       deceased age 39   Cancer - Other Paternal Grandmother    Skin cancer Paternal Grandfather    Heart disease Paternal Grandfather    Hyperlipidemia Maternal Aunt    Breast cancer Maternal Aunt        dx over 24   Breast cancer Other        dx over 78; MGMs sister   Pancreatic cancer Other        PGMs 3 brothers   Colon cancer Neg Hx    Esophageal cancer Neg Hx      HOME MEDICATIONS: Allergies as of 01/11/2023  Reactions   Rocephin [ceftriaxone Sodium In Dextrose] Rash        Medication List        Accurate as of January 11, 2023  1:47 PM. If you have any questions, ask your nurse or doctor.          amLODipine 10 MG tablet Commonly known as: NORVASC TAKE 1 TABLET BY MOUTH EVERY DAY   CALCIUM 1200 PO Take 1 tablet by mouth as directed. 2 times a week   calcium carbonate 500 MG chewable tablet Commonly known as: Tums Chew 2 tablets (400 mg of elemental calcium total) by mouth 2 (two) times daily.   celecoxib 200 MG capsule Commonly known as: CELEBREX TAKE 1 CAPSULE (200 MG TOTAL) BY MOUTH 2 (TWO) TIMES DAILY AS NEEDED FOR MODERATE PAIN.   colchicine 0.6 MG tablet Take 1.2 mg (2 tablets) and then in 1 hour 0.6 mg ( 1 tablet). Starting tomorrow, take 0.6 mg ( 1 tablet) twice daily for 3-5 days, until symptoms resolve.   CRANBERRY  PO Take 1 tablet by mouth daily.   Dexcom G6 Sensor Misc 1 Device by Does not apply route as directed.   Dexcom G6 Transmitter Misc 1 Device by Does not apply route as directed.   levothyroxine 88 MCG tablet Commonly known as: SYNTHROID TAKE 1 TABLET BY MOUTH EVERY DAY   losartan 50 MG tablet Commonly known as: COZAAR Take 1 tablet (50 mg total) by mouth daily.   metFORMIN 500 MG tablet Commonly known as: GLUCOPHAGE Take 1 tablet (500 mg total) by mouth 2 (two) times daily.   multivitamin tablet Take 1 tablet by mouth daily as needed.   omeprazole 20 MG capsule Commonly known as: PRILOSEC Take 1 capsule (20 mg total) by mouth 2 (two) times daily before a meal. Use Prilosec 20 mg twice daily for 6 weeks.  If symptoms controlled can decrease to 20 mg daily.   OneTouch Delica Lancets 30G Misc Use as needed to check glucose up to twice daiy   OneTouch Verio test strip Generic drug: glucose blood 1 each by Other route daily in the afternoon. Use as instructed   OneTouch Verio w/Device Kit 1 Device by Does not apply route daily in the afternoon.   rosuvastatin 20 MG tablet Commonly known as: CRESTOR Take 1 tablet (20 mg total) by mouth daily.   tizanidine 2 MG capsule Commonly known as: ZANAFLEX Take 1 capsule (2 mg total) by mouth 3 (three) times daily as needed for muscle spasms.   TURMERIC PO Take 1 tablet by mouth daily as needed (inflammation).   VITAMIN C PO Take 1 tablet by mouth daily as needed.   zolpidem 5 MG tablet Commonly known as: AMBIEN TAKE 1/2 TO 1 TABLET(2.5 TO 5 MG) BY MOUTH AT BEDTIME AS NEEDED FOR SLEEP          OBJECTIVE:   PHYSICAL EXAM: VS: BP 112/70 (BP Location: Left Arm, Patient Position: Sitting, Cuff Size: Large)   Pulse 81   Ht 5\' 6"  (1.676 m)   Wt 180 lb (81.6 kg)   LMP 09/04/2018 Comment: possible perimenopausal  SpO2 95%   BMI 29.05 kg/m    EXAM: General: Pt appears well and is in NAD  Neck: General: Supple  without adenopathy. Thyroid:  No goiter or nodules appreciated.   Lungs: Clear with good BS bilat   Heart: Auscultation: RRR.  Abdomen: soft, nontender  Extremities:  BL LE: No pretibial edema normal  Mental Status: Judgment,  insight: Intact Memory: Intact for recent and remote events Mood and affect: No depression, anxiety, or agitation     DATA REVIEWED:   Latest Reference Range & Units 01/11/23 14:11  TSH 0.35 - 5.50 uIU/mL 1.72    Thyroid ultrasound 01/15/2022  FINDINGS: The thyroid gland is surgically absent. No evidence of residual or recurrent thyroid tissue. No suspicious lymphadenopathy.   IMPRESSION: Surgical changes of total thyroidectomy without evidence of residual or recurrent thyroid tissue.        Thyroid Pathology 02/05/2020  FINAL MICROSCOPIC DIAGNOSIS:   A. THYROID, TOTAL, THYROIDECTOMY:  -  Papillary thyroid carcinoma, 1.2 cm  -  Benign lymph node (0/1)  -  Hyperplastic nodule with Hurthle cell changes (0.5 cm)  -  Benign cyst (2.4 cm)  -  See oncology table and comment below   ONCOLOGY TABLE:   THYROID GLAND:   Procedure: Thyroidectomy  Tumor Focality: Unifocal  Tumor Site: Left lobe  Tumor Size: 1.2 cm  Histologic Type: Papillary carcinoma, classic  Margins: Uninvolved by tumor  Angioinvasion: Not identified  Lymphatic Invasion: Not identified  Extrathyroidal extension: Not identified  Regional Lymph Nodes:       Number of Lymph Nodes Involved: 0       Nodal Levels Involved: 0       Size of Largest Metastatic Deposit: N/A       Extranodal Extension (ENE): N/A       Number of Lymph Nodes Examined: 1  Pathologic Stage Classification (pTNM, AJCC 8th Edition): pT1b, pN0   ASSESSMENT / PLAN / RECOMMENDATIONS:   Papillary Thyroid Carcinoma, Stage I :   - S/P total thyroidectomy on 02/05/2020 - She is at low risk for recurrence - Pt with excellent biochemical and structural response - TSH goal 0.5-2.0 uIU/mL  - Tg, Tg Ab  historically have been undetectable , pending on today's labs - Will proceed with thyroid bed ultrasound     2. Postoperative Hypothyroidism :  - She is clinically euthyroid  -No changes   Medications    levothyroxine 88 mcg daily   3. T2DM, Sub-optimally Controlled , A1c 7.5%   -Discussed the importance of avoiding sugar sweetened beverages as well as avoiding snacks -She recently started exercising playing tennis -We discussed increasing metformin as below  Medication Increase metformin 500 mg XR, 2 tabs with breakfast and 2 tabs with supper  F/U in 6 months     Signed electronically by: Lyndle Herrlich, MD  [facility] Endocrinology  [facility] [address] [place], [place] [address] Phone: [phone] FAX: [phone]      CC: Pearline Cables, MD [address] [place] [address] Phone: [phone]  Fax: [phone]   Return to Endocrinology clinic as below: Future Appointments  Date Time Provider Department Center  04/28/2023 10:20 AM Copland, Lauren Found, MD [facility]

## 2023-01-11 NOTE — Patient Instructions (Signed)
Please take metformin 500 mg, 2 tablets with breakfast and 1 tablet with supper for a week, then increase to 2 tablets with breakfast and 2 tablets with supper

## 2023-01-12 LAB — THYROGLOBULIN LEVEL: Thyroglobulin: 0.3 ng/mL — ABNORMAL LOW

## 2023-01-12 LAB — THYROGLOBULIN ANTIBODY: Thyroglobulin Ab: <1 [IU]/mL

## 2023-01-18 ENCOUNTER — Ambulatory Visit (HOSPITAL_BASED_OUTPATIENT_CLINIC_OR_DEPARTMENT_OTHER)
Admission: RE | Admit: 2023-01-18 | Discharge: 2023-01-18 | Disposition: A | Discharge status: Home / Self Care | Source: Ambulatory Visit | Attending: Internal Medicine | Admitting: Internal Medicine

## 2023-01-18 DIAGNOSIS — C73 Malignant neoplasm of thyroid gland: Secondary | ICD-10-CM | POA: Diagnosis present

## 2023-02-20 ENCOUNTER — Other Ambulatory Visit: Payer: Self-pay | Admitting: Internal Medicine

## 2023-02-24 ENCOUNTER — Telehealth: Payer: Self-pay

## 2023-02-24 ENCOUNTER — Other Ambulatory Visit: Payer: Self-pay

## 2023-02-24 ENCOUNTER — Other Ambulatory Visit (HOSPITAL_COMMUNITY): Payer: Self-pay

## 2023-02-24 ENCOUNTER — Encounter: Payer: Self-pay | Admitting: Family Medicine

## 2023-02-24 ENCOUNTER — Telehealth: Payer: Self-pay | Admitting: Family Medicine

## 2023-02-24 DIAGNOSIS — E119 Type 2 diabetes mellitus without complications: Secondary | ICD-10-CM

## 2023-02-24 DIAGNOSIS — E785 Hyperlipidemia, unspecified: Secondary | ICD-10-CM

## 2023-02-24 DIAGNOSIS — I10 Essential (primary) hypertension: Secondary | ICD-10-CM

## 2023-02-24 MED ORDER — ROSUVASTATIN CALCIUM 20 MG PO TABS
20.0000 mg | ORAL_TABLET | Freq: Every day | ORAL | 3 refills | Status: DC
Start: 2023-02-24 — End: 2024-02-01

## 2023-02-24 MED ORDER — LOSARTAN POTASSIUM 50 MG PO TABS
50.0000 mg | ORAL_TABLET | Freq: Every day | ORAL | 3 refills | Status: DC
Start: 2023-02-24 — End: 2024-02-01

## 2023-02-24 MED ORDER — OMEPRAZOLE 20 MG PO CPDR: 20.0000 mg | DELAYED_RELEASE_CAPSULE | Freq: Two times a day (BID) | ORAL | 3 refills | Status: AC

## 2023-02-24 MED ORDER — AMLODIPINE BESYLATE 10 MG PO TABS: ORAL_TABLET | ORAL | 3 refills | Status: DC

## 2023-02-24 MED ORDER — ONETOUCH DELICA LANCETS 30G MISC
99 refills | Status: AC
Start: 2023-02-24 — End: ?

## 2023-02-24 NOTE — Telephone Encounter (Signed)
Refills sent

## 2023-02-24 NOTE — Telephone Encounter (Signed)
Test claims show PA is not needed. Medications are too soon to refill until 12/14 due to 3 month fills done in October. If patient is going out of the country before then and for an extended period of time the patient or pharmacy will have to reach out to [employer] and inform them to get an override.

## 2023-02-24 NOTE — Telephone Encounter (Signed)
error 

## 2023-02-24 NOTE — Telephone Encounter (Signed)
Prescription Request  02/24/2023  Is this a "Controlled Substance" medicine? No  LOV: 10/25/2022  What is the name of the medication or equipment?   rosuvastatin (CRESTOR) 20 MG tablet  amLODipine (NORVASC) 10 MG tablet   losartan (COZAAR) 50 MG tablet   omeprazole (PRILOSEC) 20 MG capsule (pt asking PCP to refill)   OneTouch Delica Lancets 30G MISC  Have you contacted your pharmacy to request a refill? Yes   Which pharmacy would you like this sent to?   Pt requesting it to be sent to [facility]    Patient notified that their request is being sent to the clinical staff for review and that they should receive a response within 2 business days.   Please advise at [facility] [phone]

## 2023-02-24 NOTE — Telephone Encounter (Signed)
Patient needs a PA on Metformin, Dexcom , Levothyroxine , One touch supplies .  Number for PA  [phone]  Patient heading out of country and need done asap

## 2023-02-25 NOTE — Telephone Encounter (Signed)
Patient spoke with insurance and medication is being filled

## 2023-02-26 ENCOUNTER — Encounter: Payer: Self-pay | Admitting: Family Medicine

## 2023-02-26 DIAGNOSIS — F5101 Primary insomnia: Secondary | ICD-10-CM

## 2023-02-28 ENCOUNTER — Other Ambulatory Visit (HOSPITAL_BASED_OUTPATIENT_CLINIC_OR_DEPARTMENT_OTHER): Payer: Self-pay

## 2023-02-28 MED ORDER — ZOLPIDEM TARTRATE 5 MG PO TABS
2.5000 mg | ORAL_TABLET | Freq: Every evening | ORAL | 1 refills | Status: AC | PRN
  Filled 2023-02-28: qty 20, 20d supply, fill #0

## 2023-03-01 ENCOUNTER — Other Ambulatory Visit: Payer: Self-pay | Admitting: Physician Assistant

## 2023-03-01 ENCOUNTER — Other Ambulatory Visit (HOSPITAL_BASED_OUTPATIENT_CLINIC_OR_DEPARTMENT_OTHER): Payer: Self-pay

## 2023-03-01 DIAGNOSIS — R600 Localized edema: Secondary | ICD-10-CM

## 2023-03-01 MED ORDER — COLCHICINE 0.6 MG PO TABS
ORAL_TABLET | ORAL | 0 refills | Status: AC
Start: 2023-03-01 — End: ?
  Filled 2023-03-01: qty 15, 7d supply, fill #0

## 2023-03-07 ENCOUNTER — Other Ambulatory Visit: Payer: Self-pay

## 2023-03-07 MED ORDER — ONETOUCH VERIO VI STRP: 1.0000 | ORAL_STRIP | Freq: Every day | 3 refills | Status: AC

## 2023-03-07 MED ORDER — LEVOTHYROXINE SODIUM 88 MCG PO TABS: 88.0000 ug | ORAL_TABLET | Freq: Every day | ORAL | 2 refills | Status: DC

## 2023-04-07 ENCOUNTER — Telehealth: Payer: Self-pay | Admitting: Pharmacy Technician

## 2023-04-07 ENCOUNTER — Other Ambulatory Visit (HOSPITAL_COMMUNITY): Payer: Self-pay

## 2023-04-07 NOTE — Telephone Encounter (Signed)
 Pharmacy Patient Advocate Encounter   Received notification from Fax/[employer] PA request form that prior authorization for One Touch Verio Test strips is required/requested.   Insurance verification completed.   The patient is insured through [employer] .   Per test claim:  Freestyle Lite or Precision Xtra is preferred by the insurance.  If suggested medication is appropriate, Please send in a new RX and discontinue this one. If not, please advise as to why it's not appropriate so that we may request a Prior Authorization. Please note, some preferred medications may still require a PA  $129 for a 90 day supply of Freestyle lite. $43 if the pharmacy is able to run it as a 30 day supply.

## 2023-04-28 ENCOUNTER — Ambulatory Visit: Admitting: Family Medicine

## 2023-05-25 NOTE — Progress Notes (Unsigned)
 [facility] [address], [address] [place], [place] [address] [phone]  Date:  05/26/2023   Name:  Lauren Mcclure   DOB:  11-10-1970   MRN:  [id number]  PCP:  Pearline Cables, MD    Chief Complaint: No chief complaint on file.   History of Present Illness:  Lauren Mcclure is a 53 y.o. 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 Postoperative Hypothyroidism : - She is clinically euthyroid  -No changes Medications   levothyroxine 88 mcg daily  3. T2DM, Sub-optimally Controlled , A1c 7.5%  -Discussed the importance of avoiding sugar sweetened beverages as well as avoiding snacks -She recently started exercising playing tennis -We discussed increasing metformin as below Medication Increase metformin 500 mg XR, 2 tabs with breakfast and 2 tabs with supper  Covid booster Urine micro Eye exam Flu shot Pneumonia vaccine can be given shingrix Patient Active Problem List   Diagnosis Date Noted   Type 2 diabetes mellitus without complication, without long-term current use of insulin (HCC) 07/14/2021   Elevated ALT measurement 04/13/2021   Elevated  SGOT (AST) 04/13/2021   Cervical strain 01/01/2021   Genetic testing 06/13/2020   Family history of pancreatic cancer    Family history of breast cancer    Papillary thyroid carcinoma (HCC) 02/03/2020   Thyroid cyst 02/03/2020   Dyslipidemia 11/28/2019   Controlled diabetes mellitus type 2 with complications (HCC) 11/28/2019   Essential hypertension 05/26/2019    Past Medical History:  Diagnosis Date   Anxiety    Arthritis    Cancer (HCC)    thyroid and skin   Elevated blood pressure reading    Family history of breast cancer    Family history of pancreatic cancer    Headache    History of hyperlipidemia    History of kidney stones    Hypertension    Kidney stones    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 03/24/2016   Chest X-Ray done 03/24/16 and was negative    Past Surgical History:  Procedure Laterality Date   LITHOTRIPSY     NASAL SEPTUM SURGERY  2007   SKIN CANCER EXCISION  04/08/2020   Left lower back    THYROIDECTOMY N/A 02/05/2020   Procedure: TOTAL THYROIDECTOMY;  Surgeon: Darnell Level, MD;  Location: [facility];  Service: General;  Laterality: N/A;   TONSILLECTOMY      Social History   Tobacco Use   Smoking status: Never   Smokeless tobacco: Never  Vaping Use   Vaping status: Never Used  Substance Use Topics   Alcohol use: Not Currently    Comment: Occ wine or beer   Drug use: No    Family History  Problem Relation  Age of Onset   Hypertension Mother    Hyperlipidemia Mother    Pancreatic cancer Father 58       deceased age 103   Cancer - Other Paternal Grandmother    Skin cancer Paternal Grandfather    Heart disease Paternal Grandfather    Hyperlipidemia Maternal Aunt    Breast cancer Maternal Aunt        dx over 50   Breast cancer Other        dx over 75; MGMs sister   Pancreatic cancer Other        PGMs 3 brothers   Colon cancer Neg Hx    Esophageal cancer Neg Hx     Allergies  Allergen Reactions   Rocephin [Ceftriaxone Sodium In  Dextrose] Rash    Medication list has been reviewed and updated.  Current Outpatient Medications on File Prior to Visit  Medication Sig Dispense Refill   amLODipine (NORVASC) 10 MG tablet TAKE 1 TABLET BY MOUTH EVERY DAY 90 tablet 3   Ascorbic Acid (VITAMIN C PO) Take 1 tablet by mouth daily as needed.     Blood Glucose Monitoring Suppl (ONETOUCH VERIO) w/Device KIT 1 Device by Does not apply route daily in the afternoon. 1 kit 0   calcium carbonate (TUMS) 500 MG chewable tablet Chew 2 tablets (400 mg of elemental calcium total) by mouth 2 (two) times daily. 90 tablet 1   Calcium Carbonate-Vit D-Min (CALCIUM 1200 PO) Take 1 tablet by mouth as directed. 2 times a week     celecoxib (CELEBREX) 200 MG capsule TAKE 1 CAPSULE (200 MG TOTAL) BY MOUTH 2 (TWO) TIMES DAILY AS NEEDED FOR MODERATE PAIN. 180 capsule 0   colchicine 0.6 MG tablet Take 1.2 mg (2 tablets) and then in 1 hour 0.6 mg ( 1 tablet). Starting tomorrow, take 0.6 mg ( 1 tablet) twice daily for 3-5 days, until symptoms resolve. 15 tablet 0   Continuous Blood Gluc Sensor (DEXCOM G6 SENSOR) MISC 1 Device by Does not apply route as directed. (Patient not taking: Reported on 01/11/2023) 9 each 3   Continuous Blood Gluc Transmit (DEXCOM G6 TRANSMITTER) MISC 1 Device by Does not apply route as directed. (Patient not taking: Reported on 01/11/2023) 1 each 3   CRANBERRY PO Take 1 tablet by mouth daily.     glucose blood (ONETOUCH VERIO) test strip 1 each by Other route daily in the afternoon. Use as instructed 100 each 3   levothyroxine (SYNTHROID) 88 MCG tablet Take 1 tablet (88 mcg total) by mouth daily. 90 tablet 2   losartan (COZAAR) 50 MG tablet Take 1 tablet (50 mg total) by mouth daily. 90 tablet 3   metFORMIN (GLUCOPHAGE-XR) 500 MG 24 hr tablet Take 2 tablets (1,000 mg total) by mouth 2 (two) times daily with a meal. 360 tablet 3   Multiple Vitamin (MULTIVITAMIN) tablet Take 1 tablet by mouth daily as needed.     omeprazole (PRILOSEC) 20  MG capsule Take 1 capsule (20 mg total) by mouth 2 (two) times daily before a meal. Use Prilosec 20 mg twice daily for 6 weeks.  If symptoms controlled can decrease to 20 mg daily. 180 capsule 3   OneTouch Delica Lancets 30G MISC Use as needed to check glucose up to twice daiy 100 each PRN   rosuvastatin (CRESTOR) 20 MG tablet Take 1 tablet (20 mg total) by mouth daily. 90 tablet 3   tizanidine (ZANAFLEX) 2 MG capsule Take 1 capsule (2 mg total)  by mouth 3 (three) times daily as needed for muscle spasms. 45 capsule 1   TURMERIC PO Take 1 tablet by mouth daily as needed (inflammation).     zolpidem (AMBIEN) 5 MG tablet Take 0.5-1 tablets (2.5-5 mg total) by mouth at bedtime as needed for sleep. 20 tablet 1   [DISCONTINUED] omeprazole (PRILOSEC) 20 MG capsule Take 1 capsule (20 mg total) by mouth 2 (two) times daily before a meal. Use Prilosec 20 mg twice daily for 6 weeks.  If symptoms controlled can decrease to 20 mg daily. 90 capsule 3   No current facility-administered medications on file prior to visit.    Review of Systems:  As per HPI- otherwise negative.   Physical Examination: There were no vitals filed for this visit. There were no vitals filed for this visit. There is no height or weight on file to calculate BMI. Ideal Body Weight:    GEN: no acute distress. HEENT: Atraumatic, Normocephalic.  Ears and Nose: No external deformity. CV: RRR, No M/G/R. No JVD. No thrill. No extra heart sounds. PULM: CTA B, no wheezes, crackles, rhonchi. No retractions. No resp. distress. No accessory muscle use. ABD: S, NT, ND, +BS. No rebound. No HSM. EXTR: No c/c/e PSYCH: Normally interactive. Conversant.    Assessment and Plan: ***  Signed Abbe Amsterdam, MD

## 2023-05-25 NOTE — Patient Instructions (Incomplete)
 Good to see you again today Recommend a covid booster if not up to date - also recommend pneumonia and shingles vaccine at your convenience Flu shot today I will be in touch with your labs Keep up the good work with diet and exercise Please see me in about 6 months

## 2023-05-26 ENCOUNTER — Ambulatory Visit: Admitting: Family Medicine

## 2023-05-26 VITALS — BP 138/82 | HR 91 | Ht 66.0 in | Wt 172.0 lb

## 2023-05-26 DIAGNOSIS — I10 Essential (primary) hypertension: Secondary | ICD-10-CM | POA: Diagnosis not present

## 2023-05-26 DIAGNOSIS — E785 Hyperlipidemia, unspecified: Secondary | ICD-10-CM | POA: Diagnosis not present

## 2023-05-26 DIAGNOSIS — Z23 Encounter for immunization: Secondary | ICD-10-CM

## 2023-05-26 DIAGNOSIS — E559 Vitamin D deficiency, unspecified: Secondary | ICD-10-CM

## 2023-05-26 DIAGNOSIS — C73 Malignant neoplasm of thyroid gland: Secondary | ICD-10-CM | POA: Diagnosis not present

## 2023-05-26 DIAGNOSIS — E890 Postprocedural hypothyroidism: Secondary | ICD-10-CM

## 2023-05-26 DIAGNOSIS — E119 Type 2 diabetes mellitus without complications: Secondary | ICD-10-CM | POA: Diagnosis not present

## 2023-05-26 LAB — COMPREHENSIVE METABOLIC PANEL
ALT: 79 U/L — ABNORMAL HIGH (ref 0–35)
AST: 50 U/L — ABNORMAL HIGH (ref 0–37)
Albumin: 4.9 g/dL (ref 3.5–5.2)
Alkaline Phosphatase: 105 U/L (ref 39–117)
BUN: 14 mg/dL (ref 6–23)
CO2: 24 meq/L (ref 19–32)
Calcium: 9.8 mg/dL (ref 8.4–10.5)
Chloride: 103 meq/L (ref 96–112)
Creatinine, Ser: 0.9 mg/dL (ref 0.40–1.20)
GFR: 72.7 mL/min (ref >60.00)
Glucose, Bld: 135 mg/dL — ABNORMAL HIGH (ref 70–99)
Potassium: 3.9 meq/L (ref 3.5–5.1)
Sodium: 139 meq/L (ref 135–145)
Total Bilirubin: 2.3 mg/dL — ABNORMAL HIGH (ref 0.2–1.2)
Total Protein: 7.9 g/dL (ref 6.0–8.3)

## 2023-05-26 LAB — CBC
HCT: 45 % (ref 36.0–46.0)
Hemoglobin: 15.4 g/dL — ABNORMAL HIGH (ref 12.0–15.0)
MCHC: 34.1 g/dL (ref 30.0–36.0)
MCV: 82.5 fL (ref 78.0–100.0)
Platelets: 281 10*3/uL (ref 150.0–400.0)
RBC: 5.46 Mil/uL — ABNORMAL HIGH (ref 3.87–5.11)
RDW: 14.4 % (ref 11.5–15.5)
WBC: 7.8 10*3/uL (ref 4.0–10.5)

## 2023-05-26 LAB — LIPID PANEL
Cholesterol: 147 mg/dL (ref 0–200)
HDL: 47.9 mg/dL (ref >39.00)
LDL Cholesterol: 51 mg/dL (ref 0–99)
NonHDL: 98.96
Total CHOL/HDL Ratio: 3
Triglycerides: 242 mg/dL — ABNORMAL HIGH (ref 0.0–149.0)
VLDL: 48.4 mg/dL — ABNORMAL HIGH (ref 0.0–40.0)

## 2023-05-26 LAB — VITAMIN D 25 HYDROXY (VIT D DEFICIENCY, FRACTURES): VITD: 22.32 ng/mL — ABNORMAL LOW (ref 30.00–100.00)

## 2023-05-26 LAB — HEMOGLOBIN A1C: Hgb A1c MFr Bld: 7.3 % — ABNORMAL HIGH (ref 4.6–6.5)

## 2023-05-26 NOTE — Addendum Note (Signed)
 Addended by: Judieth Keens on: 05/26/2023 11:03 AM   Modules accepted: Orders

## 2023-05-27 ENCOUNTER — Encounter: Payer: Self-pay | Admitting: Family Medicine

## 2023-07-12 ENCOUNTER — Other Ambulatory Visit: Payer: Self-pay | Admitting: Internal Medicine

## 2023-07-12 ENCOUNTER — Telehealth: Payer: Self-pay

## 2023-07-12 ENCOUNTER — Ambulatory Visit (INDEPENDENT_AMBULATORY_CARE_PROVIDER_SITE_OTHER): Admitting: Internal Medicine

## 2023-07-12 ENCOUNTER — Encounter: Payer: Self-pay | Admitting: Internal Medicine

## 2023-07-12 VITALS — BP 126/82 | HR 81 | Ht 66.0 in | Wt 176.2 lb

## 2023-07-12 DIAGNOSIS — Z7984 Long term (current) use of oral hypoglycemic drugs: Secondary | ICD-10-CM | POA: Diagnosis not present

## 2023-07-12 DIAGNOSIS — Z85850 Personal history of malignant neoplasm of thyroid: Secondary | ICD-10-CM

## 2023-07-12 DIAGNOSIS — Z7985 Long-term (current) use of injectable non-insulin antidiabetic drugs: Secondary | ICD-10-CM | POA: Diagnosis not present

## 2023-07-12 DIAGNOSIS — E890 Postprocedural hypothyroidism: Secondary | ICD-10-CM | POA: Diagnosis not present

## 2023-07-12 DIAGNOSIS — E119 Type 2 diabetes mellitus without complications: Secondary | ICD-10-CM

## 2023-07-12 LAB — POCT GLUCOSE (DEVICE FOR HOME USE): POC Glucose: 136 mg/dL — AB (ref 70–99)

## 2023-07-12 MED ORDER — TIRZEPATIDE 5 MG/0.5ML ~~LOC~~ SOAJ: 5.0000 mg | SUBCUTANEOUS | 3 refills | Status: DC

## 2023-07-12 NOTE — Progress Notes (Unsigned)
 Name: Lauren Mcclure  MRN/ DOB: [id number], 1970/07/03    Age/ Sex: 53 y.o., female     PCP: Copland, Gwenlyn Found, MD   Reason for Endocrinology Evaluation: [facility]     Initial Endocrinology Clinic Visit: 01/08/2020    PATIENT IDENTIFIER: Lauren Mcclure is a 54 y.o., female with a past medical history of PTC. She has followed with [facility] since 01/08/2020 for consultative assistance with management of her PTC.   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 The tumor was unifocal , 1.2 cm in diameter on the left. Margins uninvolved . One L.N submitted with no involvement.    Due to hyperthyroid symptoms that was interfering with daily activities, we had reduced her LT-4 replacement dose in 03/2020  No  FH of thyroid disease     SUBJECTIVE:    Today (07/12/2023):  Lauren Mcclure is here for a follow up on PTC and postoperative hypothyroidism.   She checks glucose 0 times in the past 2 months. She has been doing juicing  Local  neck swelling  Denies tremors  Denies palpitations  She did have transient an episode of diarrhea while traveling from [place] but this has resolved    Levothyroxine 88 mcg , daily  Metformin 500 mg 2 tabs BID    HISTORY:  Past Medical History:  Past Medical History:  Diagnosis Date   Anxiety    Arthritis    Cancer (HCC)    thyroid and skin   Elevated blood pressure reading    Family history of breast cancer    Family history of pancreatic cancer    Headache    History of hyperlipidemia    History of kidney stones    Hypertension    Kidney stones    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  03/24/2016   Chest X-Ray done 03/24/16 and was negative   Past Surgical History:  Past Surgical History:  Procedure Laterality Date   LITHOTRIPSY     NASAL SEPTUM SURGERY  2007   SKIN CANCER EXCISION  04/08/2020   Left lower back    THYROIDECTOMY N/A 02/05/2020   Procedure: TOTAL THYROIDECTOMY;  Surgeon: Darnell Level, MD;  Location: [facility];  Service: General;  Laterality: N/A;   TONSILLECTOMY     Social History:  reports that she has never smoked. She has never used smokeless tobacco. She reports that she does not currently use alcohol. She reports that she does not use drugs. Family History:  Family History  Problem Relation Age of Onset   Hypertension Mother    Hyperlipidemia Mother    Pancreatic cancer Father 22       deceased age 20   Cancer - Other Paternal Grandmother    Skin cancer Paternal Grandfather    Heart disease Paternal Grandfather    Hyperlipidemia Maternal Aunt    Breast cancer Maternal Aunt        dx over 77   Breast cancer Other        dx over 43; MGMs sister   Pancreatic cancer Other        PGMs 3 brothers  Colon cancer Neg Hx    Esophageal cancer Neg Hx      HOME MEDICATIONS: Allergies as of 07/12/2023       Reactions   Rocephin [ceftriaxone Sodium In Dextrose] Rash        Medication List        Accurate as of July 12, 2023  1:36 PM. If you have any questions, ask your nurse or doctor.          amLODipine 10 MG tablet Commonly known as: NORVASC TAKE 1 TABLET BY MOUTH EVERY DAY   CALCIUM 1200 PO Take 1 tablet by mouth as directed. 2 times a week   calcium carbonate 500 MG chewable tablet Commonly known as: Tums Chew 2 tablets (400 mg of elemental calcium total) by mouth 2 (two) times daily.   celecoxib 200 MG capsule Commonly known as: CELEBREX TAKE 1 CAPSULE (200 MG TOTAL) BY MOUTH 2 (TWO) TIMES DAILY AS NEEDED FOR MODERATE PAIN.   colchicine 0.6 MG tablet Take 1.2 mg (2 tablets) and then in 1 hour 0.6 mg ( 1 tablet). Starting  tomorrow, take 0.6 mg ( 1 tablet) twice daily for 3-5 days, until symptoms resolve.   CRANBERRY PO Take 1 tablet by mouth daily.   Dexcom G6 Sensor Misc 1 Device by Does not apply route as directed.   Dexcom G6 Transmitter Misc 1 Device by Does not apply route as directed.   levothyroxine 88 MCG tablet Commonly known as: SYNTHROID Take 1 tablet (88 mcg total) by mouth daily.   losartan 50 MG tablet Commonly known as: COZAAR Take 1 tablet (50 mg total) by mouth daily.   metFORMIN 500 MG 24 hr tablet Commonly known as: GLUCOPHAGE-XR Take 2 tablets (1,000 mg total) by mouth 2 (two) times daily with a meal.   multivitamin tablet Take 1 tablet by mouth daily as needed.   omeprazole 20 MG capsule Commonly known as: PRILOSEC Take 1 capsule (20 mg total) by mouth 2 (two) times daily before a meal. Use Prilosec 20 mg twice daily for 6 weeks.  If symptoms controlled can decrease to 20 mg daily.   OneTouch Delica Lancets 30G Misc Use as needed to check glucose up to twice daiy   OneTouch Verio test strip Generic drug: glucose blood 1 each by Other route daily in the afternoon. Use as instructed   OneTouch Verio w/Device Kit 1 Device by Does not apply route daily in the afternoon.   rosuvastatin 20 MG tablet Commonly known as: CRESTOR Take 1 tablet (20 mg total) by mouth daily.   tizanidine 2 MG capsule Commonly known as: ZANAFLEX Take 1 capsule (2 mg total) by mouth 3 (three) times daily as needed for muscle spasms.   TURMERIC PO Take 1 tablet by mouth daily as needed (inflammation).   VITAMIN C PO Take 1 tablet by mouth daily as needed.   zolpidem 5 MG tablet Commonly known as: AMBIEN Take 0.5-1 tablets (2.5-5 mg total) by mouth at bedtime as needed for sleep.          OBJECTIVE:   PHYSICAL EXAM: VS: BP 126/82 (BP Location: Left Arm, Patient Position: Sitting, Cuff Size: Small)   Pulse 81   Ht 5\' 6"  (1.676 m)   Wt 176 lb 3.2 oz (79.9 kg)   LMP 09/04/2018  Comment: possible perimenopausal  SpO2 97%   BMI 28.44 kg/m     EXAM: General: Pt appears well and is in NAD  Neck: General: Supple without adenopathy. Thyroid:  No goiter or nodules appreciated.   Lungs: Clear with good BS bilat   Heart: Auscultation: RRR.  Abdomen: soft, nontender  Extremities:  BL LE: No pretibial edema normal  Mental Status: Judgment, insight: Intact Memory: Intact for recent and remote events Mood and affect: No depression, anxiety, or agitation     DATA REVIEWED:   Latest Reference Range & Units 01/11/23 14:11  TSH 0.35 - 5.50 uIU/mL 1.72    Latest Reference Range & Units 05/26/23 10:29  Sodium 135 - 145 mEq/L 139  Potassium 3.5 - 5.1 mEq/L 3.9  Chloride 96 - 112 mEq/L 103  CO2 19 - 32 mEq/L 24  Glucose 70 - 99 mg/dL 811 (H)  BUN 6 - 23 mg/dL 14  Creatinine 9.14 - 7.82 mg/dL 9.56  Calcium 8.4 - 21.3 mg/dL 9.8  Alkaline Phosphatase 39 - 117 U/L 105  Albumin 3.5 - 5.2 g/dL 4.9  AST 0 - 37 U/L 50 (H)  ALT 0 - 35 U/L 79 (H)  Total Protein 6.0 - 8.3 g/dL 7.9  Total Bilirubin 0.2 - 1.2 mg/dL 2.3 (H)  GFR >08.65 mL/min 72.70     Thyroid ultrasound 01/18/2023  Narrative & Impression  CLINICAL DATA:  Other. History of thyroid cancer, post total thyroidectomy.   EXAM: THYROID ULTRASOUND   TECHNIQUE: Ultrasound examination of the thyroid gland and adjacent soft tissues was performed.   COMPARISON:  02/03/2012; 01/23/2021; 07/11/2020   FINDINGS: Isthmus: Surgically absent. There is no residual nodular soft tissue within the isthmic resection bed.   Right lobe: Surgically absent. There is no residual nodular soft tissue within the right lobectomy resection bed.   Left lobe: Surgically absent. There is no residual nodular soft tissue within the left lobectomy resection bed.   _________________________________________________________   No regional cervical lymphadenopathy.   IMPRESSION: Post total thyroidectomy without evidence of  residual or locally recurrent disease.        Thyroid Pathology 02/05/2020  FINAL MICROSCOPIC DIAGNOSIS:   A. THYROID, TOTAL, THYROIDECTOMY:  -  Papillary thyroid carcinoma, 1.2 cm  -  Benign lymph node (0/1)  -  Hyperplastic nodule with Hurthle cell changes (0.5 cm)  -  Benign cyst (2.4 cm)  -  See oncology table and comment below   ONCOLOGY TABLE:   THYROID GLAND:   Procedure: Thyroidectomy  Tumor Focality: Unifocal  Tumor Site: Left lobe  Tumor Size: 1.2 cm  Histologic Type: Papillary carcinoma, classic  Margins: Uninvolved by tumor  Angioinvasion: Not identified  Lymphatic Invasion: Not identified  Extrathyroidal extension: Not identified  Regional Lymph Nodes:       Number of Lymph Nodes Involved: 0       Nodal Levels Involved: 0       Size of Largest Metastatic Deposit: N/A       Extranodal Extension (ENE): N/A       Number of Lymph Nodes Examined: 1  Pathologic Stage Classification (pTNM, AJCC 8th Edition): pT1b, pN0   ASSESSMENT / PLAN / RECOMMENDATIONS:   Papillary Thyroid Carcinoma, Stage I :   - S/P total thyroidectomy on 02/05/2020 - She is at low risk for recurrence - Pt with excellent biochemical and structural response - TSH goal 0.5-2.0 uIU/mL  - Tg, Tg Ab historically have been undetectable , pending on today's labs    2. Postoperative Hypothyroidism :  - She is clinically euthyroid  -No changes   Medications    levothyroxine 88 mcg daily   3. T2DM, Sub-optimally Controlled , A1c 7.3%   -  A1c remains above goal despite maximum dose of metformin -We again discussed the importance of low-carb diet, she has been exercising -We also discussed the importance of glucose checks at home -I have recommended GLP-1 agonist, caution against GI side effects, she was provided with number for sample pens of Mounjaro 2.5 mg   Medication Continue metformin 500 mg XR, 2 tabs with breakfast and 2 tabs with supper Start Mounjaro 2.5 mg weekly for 4  weeks, then increase to 5 mg weekly  F/U in 6 months     Signed electronically by: Lyndle Herrlich, MD  [facility]  [facility] [address] [place] [place], [place] [address] Phone: [phone] FAX: [phone]      CC: Pearline Cables, MD [address] [place] [address] Phone: [phone]  Fax: [phone]   Return to Endocrinology clinic as below: Future Appointments  Date Time Provider Department Center  07/12/2023  1:40 PM Lauren Mcclure, Konrad Dolores, MD [facility] None  11/23/2023 10:20 AM Copland, Gwenlyn Found, MD [facility]

## 2023-07-12 NOTE — Telephone Encounter (Signed)
 Medication Samples have been provided to the patient.  Drug name: Greggory Keen        Strength: 2.5mg         Qty: 1 box   LOT: [id number]  Exp.Date: 10/06/2024  Dosing instructions: Inject once weekly   The patient has been instructed regarding the correct time, dose, and frequency of taking this medication, including desired effects and most common side effects.   Noah Lembke L Annaliz Aven 1:56 PM 07/12/2023

## 2023-07-12 NOTE — Patient Instructions (Signed)
 Start Mounjaro 2.5 mg weekly for 4 weeks, than pick up the prescription 5 mg weekly  Continue Metformin 500 mg 2 tablets twice daily

## 2023-07-13 ENCOUNTER — Encounter: Payer: Self-pay | Admitting: Internal Medicine

## 2023-07-13 LAB — THYROGLOBULIN LEVEL: Thyroglobulin: 0.4 ng/mL — ABNORMAL LOW

## 2023-07-13 LAB — TSH: TSH: 1.97 m[IU]/L

## 2023-07-13 LAB — THYROGLOBULIN ANTIBODY: Thyroglobulin Ab: <1 [IU]/mL (ref <=1)

## 2023-07-13 MED ORDER — LEVOTHYROXINE SODIUM 88 MCG PO TABS: 88.0000 ug | ORAL_TABLET | Freq: Every day | ORAL | 2 refills | Status: DC

## 2023-10-10 ENCOUNTER — Ambulatory Visit: Payer: Self-pay

## 2023-10-10 NOTE — Telephone Encounter (Signed)
 FYI Only or Action Required?: FYI only for provider.  Patient was last seen in primary care on 05/26/2023 by Lauren Mcclure, Lauren BROCKS, MD. Called Nurse Triage reporting Leg Swelling. Symptoms began several weeks ago. Interventions attempted: Nothing. Symptoms are: unchanged.  Triage Disposition: See Physician Within 24 Hours Apt Wednesday, 7/9, per request.  Patient/caregiver understands and will follow disposition?: Yes    Copied from CRM [id number]. Topic: Clinical - Red Word Triage >> Oct 10, 2023  9:21 AM Lauren Mcclure wrote: Reason for CRM: from Both leg swollen for three weeks Reason for Disposition  [1] MODERATE leg swelling (e.g., swelling extends up to knees) AND [2] new-onset or worsening  Answer Assessment - Initial Assessment Questions 1. ONSET: When did the swelling start? (e.g., minutes, hours, days)     3 weeks ago 2. LOCATION: What part of the leg is swollen?  Are both legs swollen or just one leg?     Both legs 3. SEVERITY: How bad is the swelling? (e.g., localized; mild, moderate, severe)   - Localized: Small area of swelling localized to one leg.   - MILD pedal edema: Swelling limited to foot and ankle, pitting edema < 1/4 inch (6 mm) deep, rest and elevation eliminate most or all swelling.   - MODERATE edema: Swelling of lower leg to knee, pitting edema > 1/4 inch (6 mm) deep, rest and elevation only partially reduce swelling.   - SEVERE edema: Swelling extends above knee, facial or hand swelling present.      Moderate to severe 4. REDNESS: Does the swelling look red or infected?     yes 5. PAIN: Is the swelling painful to touch? If Yes, ask: How painful is it?   (Scale 1-10; mild, moderate or severe)     mild 6. FEVER: Do you have a fever? If Yes, ask: What is it, how was it measured, and when did it start?      no 7. CAUSE: What do you think is causing the leg swelling?     Lots of walking , long standing and sitting 8. MEDICAL HISTORY: Do you have a  history of blood clots (e.g., DVT), cancer, heart failure, kidney disease, or liver failure?     Liver and kidney disease 9. RECURRENT SYMPTOM: Have you had leg swelling before? If Yes, ask: When was the last time? What happened that time?     yes 10. OTHER SYMPTOMS: Do you have any other symptoms? (e.g., chest pain, difficulty breathing)       no 11. PREGNANCY: Is there any chance you are pregnant? When was your last menstrual period?       na  Protocols used: Leg Swelling and Edema-A-AH

## 2023-10-11 NOTE — Patient Instructions (Incomplete)
 It was good to see you today- I will be in touch with your labs soon as possible.  Will make sure there is no sign of a liver or kidney problem or heart failure.  You can use the hydrochlorothiazide  pill as needed for swelling, take either half or 1 pill daily as needed until your ankles seem normal to you.  Let me know if things do not improve in the next few days.  You might also try elevating your ankles and wearing compression socks as needed  Let me know if you would like to be seen by orthopedics for your shoulder.  I am also glad to remove that skin tag anytime you would like

## 2023-10-11 NOTE — Progress Notes (Unsigned)
 [facility] [address] [place], [place] [address] [phone]  Date:  10/12/2023   Name:  Lauren Mcclure   DOB:  [birth year]   MRN:  [id number]  PCP:  Watt Harlene BROCKS, MD    Chief Complaint: No chief complaint on file.   History of Present Illness:  Lauren Mcclure is a 53 y.o. very pleasant female patient who presents with the following:  Patient seen today with concern of leg swelling.  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  Status post total thyroidectomy and diabetes suboptimally controlled at 7.3%.  Dr. Sam also got her started on Trulicity  Eye exam Due for urine micro Shingrix Can offer pneumonia vaccine Due for foot exam update Patient Active Problem List   Diagnosis Date Noted   Type 2 diabetes mellitus without complication, without long-term current use of insulin (HCC) 07/14/2021   Elevated ALT measurement 04/13/2021   Elevated SGOT (AST) 04/13/2021   Cervical strain 01/01/2021   Genetic testing 06/13/2020   Family history of pancreatic cancer    Family history of breast cancer    Papillary thyroid  carcinoma (HCC) 02/03/2020   Thyroid  cyst 02/03/2020   Dyslipidemia 11/28/2019   Controlled diabetes mellitus type 2 with complications (HCC) 11/28/2019   Essential hypertension 05/26/2019    Past Medical History:  Diagnosis Date   Anxiety    Arthritis    Cancer (HCC)    thyroid  and skin   Elevated  blood pressure reading    Family history of breast cancer    Family history of pancreatic cancer    Headache    History of hyperlipidemia    History of kidney stones    Hypertension    Kidney stones    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 03/24/2016   Chest X-Ray done 03/24/16 and was negative    Past Surgical History:  Procedure Laterality Date   LITHOTRIPSY     NASAL SEPTUM SURGERY  2007   SKIN CANCER EXCISION  04/08/2020   Left lower back    THYROIDECTOMY N/A 02/05/2020   Procedure: TOTAL THYROIDECTOMY;  Surgeon: Eletha Boas, MD;  Location: [facility];  Service: General;  Laterality: N/A;   TONSILLECTOMY      Social History   Tobacco Use   Smoking status: Never   Smokeless tobacco: Never  Vaping Use   Vaping status: Never Used  Substance Use Topics   Alcohol use: Not Currently    Comment: Occ wine or beer   Drug use: No    Family History  Problem Relation Age of Onset   Hypertension Mother    Hyperlipidemia Mother    Pancreatic cancer Father 30       deceased age 53   Cancer - Other Paternal Grandmother    Skin cancer Paternal Grandfather    Heart disease Paternal Grandfather    Hyperlipidemia Maternal Aunt    Breast cancer Maternal Aunt        dx over 55   Breast cancer Other  dx over 50; MGMs sister   Pancreatic cancer Other        PGMs 3 brothers   Colon cancer Neg Hx    Esophageal cancer Neg Hx     Allergies  Allergen Reactions   Rocephin [Ceftriaxone Sodium In Dextrose ] Rash    Medication list has been reviewed and updated.  Current Outpatient Medications on File Prior to Visit  Medication Sig Dispense Refill   amLODipine  (NORVASC ) 10 MG tablet TAKE 1 TABLET BY MOUTH EVERY DAY 90 tablet 3   Ascorbic Acid (VITAMIN C PO) Take 1 tablet by mouth daily as needed.     Blood Glucose Monitoring Suppl (ONETOUCH VERIO) w/Device KIT 1 Device by Does not apply route daily in the afternoon. 1 kit 0   calcium  carbonate (TUMS) 500 MG chewable tablet Chew  2 tablets (400 mg of elemental calcium  total) by mouth 2 (two) times daily. 90 tablet 1   Calcium  Carbonate-Vit D-Min (CALCIUM  1200 PO) Take 1 tablet by mouth as directed. 2 times a week     celecoxib  (CELEBREX ) 200 MG capsule TAKE 1 CAPSULE (200 MG TOTAL) BY MOUTH 2 (TWO) TIMES DAILY AS NEEDED FOR MODERATE PAIN. 180 capsule 0   colchicine  0.6 MG tablet Take 1.2 mg (2 tablets) and then in 1 hour 0.6 mg ( 1 tablet). Starting tomorrow, take 0.6 mg ( 1 tablet) twice daily for 3-5 days, until symptoms resolve. 15 tablet 0   Continuous Blood Gluc Sensor (DEXCOM G6 SENSOR) MISC 1 Device by Does not apply route as directed. (Patient not taking: Reported on 07/12/2023) 9 each 3   Continuous Blood Gluc Transmit (DEXCOM G6 TRANSMITTER) MISC 1 Device by Does not apply route as directed. (Patient not taking: Reported on 07/12/2023) 1 each 3   CRANBERRY PO Take 1 tablet by mouth daily.     Dulaglutide (TRULICITY) 0.75 MG/0.5ML SOAJ Inject 0.75 mg into the skin once a week. 6 mL 3   glucose blood (ONETOUCH VERIO) test strip 1 each by Other route daily in the afternoon. Use as instructed 100 each 3   levothyroxine  (SYNTHROID ) 88 MCG tablet Take 1 tablet (88 mcg total) by mouth daily. 90 tablet 2   losartan  (COZAAR ) 50 MG tablet Take 1 tablet (50 mg total) by mouth daily. 90 tablet 3   metFORMIN  (GLUCOPHAGE -XR) 500 MG 24 hr tablet Take 2 tablets (1,000 mg total) by mouth 2 (two) times daily with a meal. 360 tablet 3   Multiple Vitamin (MULTIVITAMIN) tablet Take 1 tablet by mouth daily as needed.     omeprazole  (PRILOSEC) 20 MG capsule Take 1 capsule (20 mg total) by mouth 2 (two) times daily before a meal. Use Prilosec 20 mg twice daily for 6 weeks.  If symptoms controlled can decrease to 20 mg daily. 180 capsule 3   OneTouch Delica Lancets 30G MISC Use as needed to check glucose up to twice daiy 100 each PRN   rosuvastatin  (CRESTOR ) 20 MG tablet Take 1 tablet (20 mg total) by mouth daily. 90 tablet 3   tizanidine   (ZANAFLEX ) 2 MG capsule Take 1 capsule (2 mg total) by mouth 3 (three) times daily as needed for muscle spasms. 45 capsule 1   TURMERIC PO Take 1 tablet by mouth daily as needed (inflammation).     zolpidem  (AMBIEN ) 5 MG tablet Take 0.5-1 tablets (2.5-5 mg total) by mouth at bedtime as needed for sleep. 20 tablet 1   [DISCONTINUED] omeprazole  (PRILOSEC) 20 MG capsule Take 1 capsule (20  mg total) by mouth 2 (two) times daily before a meal. Use Prilosec 20 mg twice daily for 6 weeks.  If symptoms controlled can decrease to 20 mg daily. 90 capsule 3   No current facility-administered medications on file prior to visit.    Review of Systems:  As per HPI- otherwise negative.   Physical Examination: There were no vitals filed for this visit. There were no vitals filed for this visit. There is no height or weight on file to calculate BMI. Ideal Body Weight:    GEN: no acute distress. HEENT: Atraumatic, Normocephalic.  Ears and Nose: No external deformity. CV: RRR, No M/G/R. No JVD. No thrill. No extra heart sounds. PULM: CTA B, no wheezes, crackles, rhonchi. No retractions. No resp. distress. No accessory muscle use. ABD: S, NT, ND, +BS. No rebound. No HSM. EXTR: No c/c/e PSYCH: Normally interactive. Conversant.  Foot exam  Assessment and Plan: ***  Signed Harlene Schroeder, MD

## 2023-10-12 ENCOUNTER — Ambulatory Visit: Admitting: Family Medicine

## 2023-10-12 ENCOUNTER — Encounter: Payer: Self-pay | Admitting: Family Medicine

## 2023-10-12 VITALS — BP 122/70 | HR 77 | Temp 98.3°F | Ht 66.0 in | Wt 171.0 lb

## 2023-10-12 DIAGNOSIS — E119 Type 2 diabetes mellitus without complications: Secondary | ICD-10-CM | POA: Diagnosis not present

## 2023-10-12 DIAGNOSIS — R600 Localized edema: Secondary | ICD-10-CM

## 2023-10-12 LAB — BRAIN NATRIURETIC PEPTIDE: Pro B Natriuretic peptide (BNP): 8 pg/mL (ref 0.0–100.0)

## 2023-10-12 LAB — COMPREHENSIVE METABOLIC PANEL WITH GFR
ALT: 85 U/L — ABNORMAL HIGH (ref 0–35)
AST: 48 U/L — ABNORMAL HIGH (ref 0–37)
Albumin: 4.9 g/dL (ref 3.5–5.2)
Alkaline Phosphatase: 106 U/L (ref 39–117)
BUN: 14 mg/dL (ref 6–23)
CO2: 27 meq/L (ref 19–32)
Calcium: 9.8 mg/dL (ref 8.4–10.5)
Chloride: 104 meq/L (ref 96–112)
Creatinine, Ser: 1.02 mg/dL (ref 0.40–1.20)
GFR: 62.4 mL/min (ref >60.00)
Glucose, Bld: 118 mg/dL — ABNORMAL HIGH (ref 70–99)
Potassium: 4.1 meq/L (ref 3.5–5.1)
Sodium: 140 meq/L (ref 135–145)
Total Bilirubin: 1.9 mg/dL — ABNORMAL HIGH (ref 0.2–1.2)
Total Protein: 7.9 g/dL (ref 6.0–8.3)

## 2023-10-12 LAB — CBC
HCT: 45.1 % (ref 36.0–46.0)
Hemoglobin: 14.9 g/dL (ref 12.0–15.0)
MCHC: 33 g/dL (ref 30.0–36.0)
MCV: 82.8 fl (ref 78.0–100.0)
Platelets: 313 K/uL (ref 150.0–400.0)
RBC: 5.46 Mil/uL — ABNORMAL HIGH (ref 3.87–5.11)
RDW: 14.7 % (ref 11.5–15.5)
WBC: 7 K/uL (ref 4.0–10.5)

## 2023-10-12 LAB — MICROALBUMIN / CREATININE URINE RATIO
Creatinine,U: 334.6 mg/dL
Microalb Creat Ratio: 10 mg/g (ref 0.0–30.0)
Microalb, Ur: 3.3 mg/dL — ABNORMAL HIGH (ref 0.0–1.9)

## 2023-10-12 LAB — HEMOGLOBIN A1C: Hgb A1c MFr Bld: 6.5 % (ref 4.6–6.5)

## 2023-10-12 MED ORDER — HYDROCHLOROTHIAZIDE 25 MG PO TABS: 25.0000 mg | ORAL_TABLET | Freq: Every day | ORAL | 0 refills | Status: DC

## 2023-10-27 ENCOUNTER — Other Ambulatory Visit: Payer: Self-pay | Admitting: Family Medicine

## 2023-10-27 DIAGNOSIS — I10 Essential (primary) hypertension: Secondary | ICD-10-CM

## 2023-11-03 ENCOUNTER — Other Ambulatory Visit: Payer: Self-pay | Admitting: Family Medicine

## 2023-11-03 DIAGNOSIS — R600 Localized edema: Secondary | ICD-10-CM

## 2023-11-23 ENCOUNTER — Ambulatory Visit: Admitting: Family Medicine

## 2024-01-11 ENCOUNTER — Ambulatory Visit: Admitting: Internal Medicine

## 2024-01-11 ENCOUNTER — Other Ambulatory Visit

## 2024-01-11 ENCOUNTER — Encounter: Payer: Self-pay | Admitting: Internal Medicine

## 2024-01-11 VITALS — BP 124/80 | HR 84 | Ht 66.0 in | Wt 177.4 lb

## 2024-01-11 DIAGNOSIS — E119 Type 2 diabetes mellitus without complications: Secondary | ICD-10-CM | POA: Diagnosis not present

## 2024-01-11 DIAGNOSIS — Z7985 Long-term (current) use of injectable non-insulin antidiabetic drugs: Secondary | ICD-10-CM

## 2024-01-11 DIAGNOSIS — E890 Postprocedural hypothyroidism: Secondary | ICD-10-CM | POA: Diagnosis not present

## 2024-01-11 DIAGNOSIS — Z7984 Long term (current) use of oral hypoglycemic drugs: Secondary | ICD-10-CM | POA: Diagnosis not present

## 2024-01-11 DIAGNOSIS — Z85850 Personal history of malignant neoplasm of thyroid: Secondary | ICD-10-CM | POA: Diagnosis not present

## 2024-01-11 LAB — POCT GLYCOSYLATED HEMOGLOBIN (HGB A1C): Hemoglobin A1C: 6.5 % — AB (ref 4.0–5.6)

## 2024-01-11 LAB — POCT GLUCOSE (DEVICE FOR HOME USE): Glucose Fasting, POC: 129 mg/dL — AB (ref 70–99)

## 2024-01-11 MED ORDER — TRULICITY 1.5 MG/0.5ML ~~LOC~~ SOAJ: 1.5000 mg | SUBCUTANEOUS | 3 refills | Status: AC

## 2024-01-11 MED ORDER — METFORMIN HCL ER 500 MG PO TB24: 1000.0000 mg | ORAL_TABLET | Freq: Two times a day (BID) | ORAL | 3 refills | Status: AC

## 2024-01-11 NOTE — Patient Instructions (Signed)
 Increase Trulicity 1.5 mg weekly  Continue Metformin  500 mg 2 tablets twice daily

## 2024-01-11 NOTE — Progress Notes (Unsigned)
 Name: Lauren Mcclure  MRN/ DOB: [id number], 04-21-1970    Age/ Sex: 53 y.o., female     PCP: Copland, Harlene BROCKS, MD   Reason for Endocrinology Evaluation: [facility]     Initial Endocrinology Clinic Visit: 01/08/2020    PATIENT IDENTIFIER: Lauren Mcclure is a 53 y.o., female with a past medical history of PTC. She has followed with [facility] since 01/08/2020 for consultative assistance with management of her PTC.   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 The tumor was unifocal , 1.2 cm in diameter on the left. Margins uninvolved . One L.N submitted with no involvement.    Due to hyperthyroid symptoms that was interfering with daily activities, we had reduced her LT-4 replacement dose in 03/2020  No  FH of thyroid  disease    DIABETES HISTORY : The patient has been diagnosed with T2DM in 2021.  She has been on metformin  since diagnosis.  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  She checks blood sugars 0 times daily.  No nausea or vomiting  No constipation or diarrhea  No local neck swelling  No palpitations  Blurry vision has been noted for the past 6 months , worse at night  Weight stable  She has been playing tennis    HOME ENDOCRINE REGIMEN:  Levothyroxine  88 mcg  daily Metformin  500 mg, 2 tabs twice daily Trulicity 0.75 mg weekly    Statin: yes    METER DOWNLOAD SUMMARY: n/a   DIABETIC COMPLICATIONS: Microvascular complications:   Denies: neuropathy, DR, CKD Last Eye Exam: Completed   Macrovascular complications:   Denies: CAD, CVA, PVD   SUBJECTIVE:    Today (01/11/2024):  Lauren Mcclure is here for a follow up on PTC and postoperative hypothyroidism.   She checks glucose 0 times in the past 2 months. 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 She has never used smokeless tobacco. She reports that she does not currently use alcohol. She reports that she does not use drugs. Family History:  Family History  Problem Relation Age of Onset   Hypertension Mother    Hyperlipidemia Mother    Pancreatic cancer Father 37       deceased age 37   Cancer - Other Paternal Grandmother    Skin cancer  Paternal Grandfather    Heart disease Paternal Grandfather    Hyperlipidemia Maternal Aunt    Breast cancer Maternal Aunt        dx over 4   Breast cancer Other        dx over 63; MGMs sister   Pancreatic cancer Other        PGMs 3 brothers   Colon cancer Neg Hx    Esophageal cancer Neg Hx      HOME MEDICATIONS: Allergies as of 01/11/2024       Reactions   Rocephin [ceftriaxone Sodium In Dextrose ] Rash        Medication List        Accurate as of January 11, 2024 10:21 AM. If you have any questions, ask your nurse or doctor.          amLODipine  10 MG tablet Commonly known as: NORVASC  Take 1 tablet (10 mg total) by mouth daily.   CALCIUM  1200 PO Take 1 tablet by mouth as directed. 2 times a week   calcium  carbonate 500 MG chewable tablet Commonly known as: Tums Chew 2 tablets (400 mg of elemental calcium  total) by mouth 2 (two) times daily.   celecoxib  200 MG capsule Commonly known as: CELEBREX  TAKE 1 CAPSULE (200 MG TOTAL) BY MOUTH 2 (TWO) TIMES DAILY AS NEEDED FOR MODERATE PAIN.   colchicine  0.6 MG tablet Take 1.2 mg (2 tablets) and then in 1 hour 0.6 mg ( 1 tablet). Starting tomorrow, take 0.6 mg ( 1 tablet) twice daily for 3-5 days, until symptoms resolve.   CRANBERRY PO Take 1 tablet by mouth daily.   Dexcom G6 Sensor Misc 1 Device by Does not apply route as directed.   Dexcom G6 Transmitter Misc 1 Device by Does not apply route as directed.   hydrochlorothiazide  25 MG tablet Commonly known as: HYDRODIURIL  TAKE 1 TABLET (25 MG TOTAL) BY MOUTH DAILY. USE ONCE DAILY AS NEEDED FOR SWELLING   levothyroxine  88 MCG tablet Commonly known as: SYNTHROID  Take 1 tablet (88 mcg total) by mouth daily.   losartan  50 MG tablet Commonly known as: COZAAR  Take 1 tablet (50 mg total) by mouth daily.   metFORMIN  500 MG 24 hr tablet Commonly known as: GLUCOPHAGE -XR Take 2 tablets (1,000 mg total) by mouth 2 (two) times daily with a meal.   multivitamin  tablet Take 1 tablet by mouth daily as needed.   omeprazole  20 MG capsule Commonly known as: PRILOSEC Take 1 capsule (20 mg total) by mouth 2 (two) times daily before a meal. Use Prilosec 20 mg twice daily for 6 weeks.  If symptoms controlled can decrease to 20 mg daily.   OneTouch Delica Lancets 30G  Misc Use as needed to check glucose up to twice daiy   OneTouch Verio test strip Generic drug: glucose blood 1 each by Other route daily in the afternoon. Use as instructed   OneTouch Verio w/Device Kit 1 Device by Does not apply route daily in the afternoon.   rosuvastatin  20 MG tablet Commonly known as: CRESTOR  Take 1 tablet (20 mg total) by mouth daily.   tizanidine  2 MG capsule Commonly known as: ZANAFLEX  Take 1 capsule (2 mg total) by mouth 3 (three) times daily as needed for muscle spasms.   Trulicity 0.75 MG/0.5ML Soaj Generic drug: Dulaglutide Inject 0.75 mg into the skin once a week.   TURMERIC PO Take 1 tablet by mouth daily as needed (inflammation).   VITAMIN C PO Take 1 tablet by mouth daily as needed.   zolpidem  5 MG tablet Commonly known as: AMBIEN  Take 0.5-1 tablets (2.5-5 mg total) by mouth at bedtime as needed for sleep.          OBJECTIVE:   PHYSICAL EXAM: VS: BP 124/80 (BP Location: Left Arm, Patient Position: Sitting, Cuff Size: Large)   Pulse 84   Ht 5' 6 (1.676 m)   Wt 177 lb 6.4 oz (80.5 kg)   LMP 09/04/2018 Comment: possible perimenopausal  SpO2 99%   BMI 28.63 kg/m     EXAM: General: Pt appears well and is in NAD  Neck: General: Supple without adenopathy. Thyroid :  No goiter or nodules appreciated.   Lungs: Clear with good BS bilat   Heart: Auscultation: RRR.  Abdomen: soft, nontender  Extremities:  BL LE: No pretibial edema normal  Mental Status: Judgment, insight: Intact Memory: Intact for recent and remote events Mood and affect: No depression, anxiety, or agitation     DATA REVIEWED:    Latest Reference Range &  Units 01/11/24 10:36  TSH mIU/L 0.56     Latest Reference Range & Units 10/12/23 11:03  Comprehensive metabolic panel with GFR  Rpt !  Sodium 135 - 145 mEq/L 140  Potassium 3.5 - 5.1 mEq/L 4.1  Chloride 96 - 112 mEq/L 104  CO2 19 - 32 mEq/L 27  Glucose 70 - 99 mg/dL 881 (H)  BUN 6 - 23 mg/dL 14  Creatinine 9.59 - 8.79 mg/dL 8.97  Calcium  8.4 - 10.5 mg/dL 9.8  Alkaline Phosphatase 39 - 117 U/L 106  Albumin 3.5 - 5.2 g/dL 4.9  AST 0 - 37 U/L 48 (H)  ALT 0 - 35 U/L 85 (H)  Total Protein 6.0 - 8.3 g/dL 7.9  Total Bilirubin 0.2 - 1.2 mg/dL 1.9 (H)  GFR >39.99 mL/min 62.40  !: Data is abnormal (H): Data is abnormally high Rpt: View report in Results Review for more information    Thyroid  ultrasound 01/18/2023  Narrative & Impression  CLINICAL DATA:  Other. History of thyroid  cancer, post total thyroidectomy.   EXAM: THYROID  ULTRASOUND   TECHNIQUE: Ultrasound examination of the thyroid  gland and adjacent soft tissues was performed.   COMPARISON:  02/03/2012; 01/23/2021; 07/11/2020   FINDINGS: Isthmus: Surgically absent. There is no residual nodular soft tissue within the isthmic resection bed.   Right lobe: Surgically absent. There is no residual nodular soft tissue within the right lobectomy resection bed.   Left lobe: Surgically absent. There is no residual nodular soft tissue within the left lobectomy resection bed.   _________________________________________________________   No regional cervical lymphadenopathy.   IMPRESSION: Post total thyroidectomy without evidence of residual or locally recurrent disease.  Thyroid  Pathology 02/05/2020  FINAL MICROSCOPIC DIAGNOSIS:   A. 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)  -  Benign cyst (2.4 cm)  -  See oncology table and comment below   ONCOLOGY TABLE:   THYROID  GLAND:    Procedure: Thyroidectomy  Tumor Focality: Unifocal  Tumor Site: Left lobe  Tumor Size: 1.2 cm  Histologic Type: Papillary carcinoma, classic  Margins: Uninvolved by tumor  Angioinvasion: Not identified  Lymphatic Invasion: Not identified  Extrathyroidal extension: Not identified  Regional Lymph Nodes:       Number of Lymph Nodes Involved: 0       Nodal Levels Involved: 0       Size of Largest Metastatic Deposit: N/A       Extranodal Extension (ENE): N/A       Number of Lymph Nodes Examined: 1  Pathologic Stage Classification (pTNM, AJCC 8th Edition): pT1b, pN0   ASSESSMENT / PLAN / RECOMMENDATIONS:   Papillary Thyroid  Carcinoma, Stage I :   - S/P total thyroidectomy on 02/05/2020 - She is at low risk for recurrence - Pt with excellent biochemical and structural response - TSH goal 0.5-2.0 uIU/mL  - Tg, Tg Ab historically have been undetectable , pending on today's labs    2. Postoperative Hypothyroidism :  - She is clinically euthyroid  - TSH within goal, no change   Medications   Continue levothyroxine  88 mcg daily      3. T2DM, Optimally Controlled , A1c 6.5%   -A1c is optimal -She is tolerating Trulicity, will increase the dose as below -Patient encouraged to continue with lifestyle changes    Medication Continue metformin  500 mg XR, 2 tabs with breakfast and 2 tabs with supper Increase Trulicity 1.5 Mg weekly  F/U in 6 months     Signed electronically by: Stefano Redgie Butts, MD  [facility]  [facility] [address] [place] [place], [place] [address] Phone: [phone] FAX: [phone]      CC: Watt Harlene BROCKS, MD [address] [place] [address] Phone: [phone]  Fax: [phone]   Return to Endocrinology clinic as below: No future appointments.

## 2024-01-12 ENCOUNTER — Ambulatory Visit: Payer: Self-pay | Admitting: Internal Medicine

## 2024-01-12 MED ORDER — LEVOTHYROXINE SODIUM 88 MCG PO TABS
88.0000 ug | ORAL_TABLET | Freq: Every day | ORAL | 3 refills | Status: AC
Start: 2024-01-12 — End: ?

## 2024-01-18 LAB — THYROGLOBULIN LEVEL: Thyroglobulin: 0.2 ng/mL — ABNORMAL LOW

## 2024-01-18 LAB — TSH: TSH: 0.56 m[IU]/L

## 2024-01-18 LAB — THYROGLOBULIN ANTIBODY: Thyroglobulin Ab: <1 [IU]/mL (ref <=1)

## 2024-01-31 ENCOUNTER — Other Ambulatory Visit: Payer: Self-pay | Admitting: Family Medicine

## 2024-01-31 DIAGNOSIS — I10 Essential (primary) hypertension: Secondary | ICD-10-CM

## 2024-01-31 DIAGNOSIS — E785 Hyperlipidemia, unspecified: Secondary | ICD-10-CM

## 2024-04-27 ENCOUNTER — Other Ambulatory Visit: Payer: Self-pay | Admitting: Family Medicine

## 2024-04-27 DIAGNOSIS — I10 Essential (primary) hypertension: Secondary | ICD-10-CM

## 2024-04-28 ENCOUNTER — Other Ambulatory Visit: Payer: Self-pay | Admitting: Family Medicine

## 2024-04-28 DIAGNOSIS — E785 Hyperlipidemia, unspecified: Secondary | ICD-10-CM

## 2024-07-11 ENCOUNTER — Ambulatory Visit: Admitting: Internal Medicine
